# Patient Record
Sex: Male | Born: 1980 | Hispanic: Yes | Marital: Married | Smoking: Current every day smoker
Health system: Southern US, Community
[De-identification: ages and names within clinical notes are randomized; demographics above are authoritative.]

## PROBLEM LIST (undated history)

## (undated) DIAGNOSIS — I10 Essential (primary) hypertension: Secondary | ICD-10-CM

## (undated) DIAGNOSIS — J45909 Unspecified asthma, uncomplicated: Secondary | ICD-10-CM

## (undated) DIAGNOSIS — J189 Pneumonia, unspecified organism: Secondary | ICD-10-CM

## (undated) DIAGNOSIS — F410 Panic disorder [episodic paroxysmal anxiety] without agoraphobia: Secondary | ICD-10-CM

---

## 2014-09-13 ENCOUNTER — Emergency Department (HOSPITAL_COMMUNITY): Payer: Medicaid Other

## 2014-09-13 ENCOUNTER — Encounter (HOSPITAL_COMMUNITY): Payer: Self-pay

## 2014-09-13 ENCOUNTER — Emergency Department (HOSPITAL_COMMUNITY)
Admission: EM | Admit: 2014-09-13 | Discharge: 2014-09-14 | Disposition: A | Discharge status: Home / Self Care | Payer: Medicaid Other | Attending: Emergency Medicine | Admitting: Emergency Medicine

## 2014-09-13 DIAGNOSIS — Z7951 Long term (current) use of inhaled steroids: Secondary | ICD-10-CM | POA: Diagnosis not present

## 2014-09-13 DIAGNOSIS — J45909 Unspecified asthma, uncomplicated: Secondary | ICD-10-CM | POA: Diagnosis present

## 2014-09-13 DIAGNOSIS — Z79899 Other long term (current) drug therapy: Secondary | ICD-10-CM | POA: Diagnosis not present

## 2014-09-13 DIAGNOSIS — J45901 Unspecified asthma with (acute) exacerbation: Secondary | ICD-10-CM

## 2014-09-13 DIAGNOSIS — R0602 Shortness of breath: Secondary | ICD-10-CM

## 2014-09-13 HISTORY — DX: Unspecified asthma, uncomplicated: J45.909

## 2014-09-13 LAB — CBC WITH DIFFERENTIAL/PLATELET
BASOS ABS: 0.1 10*3/uL (ref 0.0–0.1)
BASOS PCT: 1 % (ref 0–1)
Eosinophils Absolute: 0.5 10*3/uL (ref 0.0–0.7)
Eosinophils Relative: 9 % — ABNORMAL HIGH (ref 0–5)
HEMATOCRIT: 44.1 % (ref 39.0–52.0)
Hemoglobin: 15.3 g/dL (ref 13.0–17.0)
Lymphocytes Relative: 40 % (ref 12–46)
Lymphs Abs: 2.3 10*3/uL (ref 0.7–4.0)
MCH: 29.1 pg (ref 26.0–34.0)
MCHC: 34.7 g/dL (ref 30.0–36.0)
MCV: 83.8 fL (ref 78.0–100.0)
Monocytes Absolute: 0.3 10*3/uL (ref 0.1–1.0)
Monocytes Relative: 5 % (ref 3–12)
NEUTROS ABS: 2.6 10*3/uL (ref 1.7–7.7)
NEUTROS PCT: 45 % (ref 43–77)
Platelets: 216 10*3/uL (ref 150–400)
RBC: 5.26 MIL/uL (ref 4.22–5.81)
RDW: 13.5 % (ref 11.5–15.5)
WBC: 5.7 10*3/uL (ref 4.0–10.5)

## 2014-09-13 LAB — BASIC METABOLIC PANEL
Anion gap: 9 (ref 5–15)
BUN: 10 mg/dL (ref 6–20)
CO2: 21 mmol/L — AB (ref 22–32)
CREATININE: 1.27 mg/dL — AB (ref 0.61–1.24)
Calcium: 8.7 mg/dL — ABNORMAL LOW (ref 8.9–10.3)
Chloride: 110 mmol/L (ref 101–111)
GFR calc non Af Amer: >60 mL/min (ref >60)
Glucose, Bld: 122 mg/dL — ABNORMAL HIGH (ref 65–99)
Potassium: 3.4 mmol/L — ABNORMAL LOW (ref 3.5–5.1)
Sodium: 140 mmol/L (ref 135–145)

## 2014-09-13 NOTE — Discharge Instructions (Signed)

## 2014-09-13 NOTE — ED Notes (Signed)
02 removed per VO Dr. Blinda Leatherwood

## 2014-09-13 NOTE — ED Provider Notes (Signed)
CSN: [id number]     Arrival date & time 09/13/14  2203 History   First MD Initiated Contact with Patient 09/13/14 2209     Chief Complaint  Patient presents with  . Asthma     (Consider location/radiation/quality/duration/timing/severity/associated sxs/prior Treatment) HPI Comments: Patient with previous history of asthma presents to the emergency department by ambulance for shortness of breath. Patient reportedly started having difficulty breathing with cough, congestion 2 days ago. He has had a cough that is productive of green sputum. He started having increased wheezing this morning. He does not have an inhaler. He recently moved to the area, does not have a primary physician. EMS was called and he felt better after nebulizer treatment, did not transport to the hospital. He went to work today, then 1 walking home, began to have shortness of breath again. Patient is brought to the ER after receiving Solu-Medrol, continuous albuterol and Atrovent. He is improving.  Patient is a 34 y.o. male presenting with asthma.  Asthma Associated symptoms include shortness of breath.    Past Medical History  Diagnosis Date  . Asthma    History reviewed. No pertinent past surgical history. History reviewed. No pertinent family history. History  Substance Use Topics  . Smoking status: Current Some Day Smoker  . Smokeless tobacco: Not on file  . Alcohol Use: Yes     Comment: occ    Review of Systems  Respiratory: Positive for cough, shortness of breath and wheezing.   All other systems reviewed and are negative.     Allergies  Review of patient's allergies indicates no known allergies.  Home Medications   Prior to Admission medications   Medication Sig Start Date End Date Taking? Authorizing Provider  albuterol (PROVENTIL HFA;VENTOLIN HFA) 108 (90 BASE) MCG/ACT inhaler Inhale 1 puff into the lungs 2 (two) times daily as needed for wheezing or shortness of breath.   Yes Historical  Provider, MD  Fluticasone-Salmeterol (ADVAIR DISKUS IN) Inhale 1 puff into the lungs 2 (two) times daily.   Yes Historical Provider, MD   BP 106/55 mmHg  Pulse 83  Temp(Src) 98 F (36.7 C) (Oral)  Resp 18  Ht 6' (1.829 m)  Wt 187 lb (84.823 kg)  BMI 25.36 kg/m2  SpO2 91% Physical Exam  Constitutional: He is oriented to person, place, and time. He appears well-developed and well-nourished. No distress.  HENT:  Head: Normocephalic and atraumatic.  Right Ear: Hearing normal.  Left Ear: Hearing normal.  Nose: Nose normal.  Mouth/Throat: Oropharynx is clear and moist and mucous membranes are normal.  Eyes: Conjunctivae and EOM are normal. Pupils are equal, round, and reactive to light.  Neck: Normal range of motion. Neck supple.  Cardiovascular: Regular rhythm, S1 normal and S2 normal.  Exam reveals no gallop and no friction rub.   No murmur heard. Pulmonary/Chest: Effort normal and breath sounds normal. No respiratory distress. He exhibits no tenderness.  Abdominal: Soft. Normal appearance and bowel sounds are normal. There is no hepatosplenomegaly. There is no tenderness. There is no rebound, no guarding, no tenderness at McBurney's point and negative Murphy's sign. No hernia.  Musculoskeletal: Normal range of motion.  Neurological: He is alert and oriented to person, place, and time. He has normal strength. No cranial nerve deficit or sensory deficit. Coordination normal. GCS eye subscore is 4. GCS verbal subscore is 5. GCS motor subscore is 6.  Skin: Skin is warm, dry and intact. No rash noted. No cyanosis.  Psychiatric: He has a normal  mood and affect. His speech is normal and behavior is normal. Thought content normal.  Nursing note and vitals reviewed.   ED Course  Procedures (including critical care time) Labs Review Labs Reviewed  CBC WITH DIFFERENTIAL/PLATELET - Abnormal; Notable for the following:    Eosinophils Relative 9 (*)    All other components within normal limits   BASIC METABOLIC PANEL - Abnormal; Notable for the following:    Potassium 3.4 (*)    CO2 21 (*)    Glucose, Bld 122 (*)    Creatinine, Ser 1.27 (*)    Calcium 8.7 (*)    All other components within normal limits    Imaging Review Dg Chest 2 View  09/13/2014   CLINICAL DATA:  Chest pain and shortness of breath. Symptoms for 1 day.  EXAM: CHEST  2 VIEW  COMPARISON:  None.  FINDINGS: Borderline hyperinflation. The cardiomediastinal contours are normal. Pulmonary vasculature is normal. No consolidation, pleural effusion, or pneumothorax. No acute osseous abnormalities are seen.  IMPRESSION: Borderline hyperinflation which can be seen with bronchitis, asthma, or may be chronic. No localizing process.   Electronically Signed   By: Rubye Oaks M.D.   On: 09/13/2014 23:37     EKG Interpretation   Date/Time:  Monday Sep 13 2014 22:13:40 EDT Ventricular Rate:  86 PR Interval:  140 QRS Duration: 101 QT Interval:  354 QTC Calculation: 423 R Axis:   79 Text Interpretation:  Sinus rhythm ST elev, probable normal early repol  pattern Confirmed by Dlynn Ranes  MD, Kely Dohn [phone]) on 09/13/2014  10:19:31 PM      MDM   Final diagnoses:  Shortness of breath   asthma exacerbation  Presents to the ER for evaluation of 2 days of cough, worsening shortness of breath and wheezing. She has a history of asthma, does not have access to the dilators at home. He has improved after treatment here in the ER. Patient will be discharged with inhaler. He will be given a course of prednisone. Follow-up as needed, was referred to primary care.    Gilda Crease, MD 09/13/14 [phone]

## 2014-09-13 NOTE — ED Notes (Signed)
Per EMS, pt has hx of asthma, had an asthma attack this morning ems came out and gave him a treatment and the pt denied transport at that time. About an hour ago pt began having another asthma attack and ems called out for transport. Pt received 10mg  total albuterol, 0.5atrovent, and 125mg  solumedrol in route. Pt still wheezing but feeling better and breathing better since treatment. Pt states that he had productive cough x 2 days with yellow phlem. Pty recently moved here and has been out of his albuterol and advair since. Pt in no acute distress at this time. Dr Blinda Leatherwood in room.

## 2014-09-14 MED ORDER — FLUTICASONE-SALMETEROL 250-50 MCG/DOSE IN AEPB: 1.0000 | INHALATION_SPRAY | Freq: Two times a day (BID) | RESPIRATORY_TRACT | Status: DC

## 2014-09-14 MED ORDER — PREDNISONE 20 MG PO TABS: 60.0000 mg | ORAL_TABLET | Freq: Every day | ORAL | Status: DC

## 2014-09-14 MED ORDER — ALBUTEROL SULFATE HFA 108 (90 BASE) MCG/ACT IN AERS: 2.0000 | INHALATION_SPRAY | RESPIRATORY_TRACT | Status: DC | PRN

## 2014-09-14 MED ORDER — ALBUTEROL SULFATE HFA 108 (90 BASE) MCG/ACT IN AERS
2.0000 | INHALATION_SPRAY | RESPIRATORY_TRACT | Status: DC | PRN
  Administered 2014-09-14: 2 via RESPIRATORY_TRACT
  Filled 2014-09-14: qty 6.7

## 2014-09-29 ENCOUNTER — Emergency Department (HOSPITAL_COMMUNITY)
Admission: EM | Admit: 2014-09-29 | Discharge: 2014-09-29 | Disposition: A | Discharge status: Home / Self Care | Payer: Medicaid Other | Attending: Emergency Medicine | Admitting: Emergency Medicine

## 2014-09-29 ENCOUNTER — Emergency Department (HOSPITAL_COMMUNITY): Payer: Medicaid Other

## 2014-09-29 ENCOUNTER — Encounter (HOSPITAL_COMMUNITY): Payer: Self-pay

## 2014-09-29 DIAGNOSIS — S63619A Unspecified sprain of unspecified finger, initial encounter: Secondary | ICD-10-CM

## 2014-09-29 DIAGNOSIS — Z79899 Other long term (current) drug therapy: Secondary | ICD-10-CM | POA: Diagnosis not present

## 2014-09-29 DIAGNOSIS — Y990 Civilian activity done for income or pay: Secondary | ICD-10-CM | POA: Diagnosis not present

## 2014-09-29 DIAGNOSIS — Z7952 Long term (current) use of systemic steroids: Secondary | ICD-10-CM | POA: Insufficient documentation

## 2014-09-29 DIAGNOSIS — W1839XA Other fall on same level, initial encounter: Secondary | ICD-10-CM | POA: Diagnosis not present

## 2014-09-29 DIAGNOSIS — J45909 Unspecified asthma, uncomplicated: Secondary | ICD-10-CM | POA: Insufficient documentation

## 2014-09-29 DIAGNOSIS — Y939 Activity, unspecified: Secondary | ICD-10-CM | POA: Insufficient documentation

## 2014-09-29 DIAGNOSIS — Z720 Tobacco use: Secondary | ICD-10-CM | POA: Insufficient documentation

## 2014-09-29 DIAGNOSIS — S8001XA Contusion of right knee, initial encounter: Secondary | ICD-10-CM | POA: Diagnosis not present

## 2014-09-29 DIAGNOSIS — Y929 Unspecified place or not applicable: Secondary | ICD-10-CM | POA: Diagnosis not present

## 2014-09-29 DIAGNOSIS — S63617A Unspecified sprain of left little finger, initial encounter: Secondary | ICD-10-CM | POA: Diagnosis not present

## 2014-09-29 DIAGNOSIS — S8991XA Unspecified injury of right lower leg, initial encounter: Secondary | ICD-10-CM | POA: Diagnosis present

## 2014-09-29 MED ORDER — IBUPROFEN 800 MG PO TABS: 800.0000 mg | ORAL_TABLET | Freq: Three times a day (TID) | ORAL | Status: DC

## 2014-09-29 NOTE — ED Notes (Signed)
Patient arrived via EMS with complaints of right knee pain following fall at work this morning.

## 2014-09-29 NOTE — ED Provider Notes (Signed)
CSN: [id number]     Arrival date & time 09/29/14  2235 History  This chart was scribed for Elpidio Anis, PA-C, working with Doug Sou, MD by Chestine Spore, ED Scribe. The patient was seen in room WTR8/WTR8 at 11:35 PM.    Chief Complaint  Patient presents with  . Knee Injury      The history is provided by the patient. No language interpreter was used.    HPI Comments: Joseph Huff is a 34 y.o. male who presents to the Emergency Department complaining of right knee injury onset this morning. He reports that he fell at work onto concrete and his right knee broke his fall. He notes that his fingers bent backwards when he fell. He reports that he has been walking on the knee since the incident. Denies any prior injury to this knee. He states that he is having associated symptoms of left hand pain, joint swelling of left hand and right knee, bruising to right knee. He denies any other symptoms.   Past Medical History  Diagnosis Date  . Asthma    History reviewed. No pertinent past surgical history. History reviewed. No pertinent family history. History  Substance Use Topics  . Smoking status: Current Some Day Smoker  . Smokeless tobacco: Not on file  . Alcohol Use: Yes     Comment: occ    Review of Systems  Musculoskeletal: Positive for joint swelling and arthralgias (right knee and left hand).  Skin: Positive for color change and wound.  Neurological: Negative for weakness and numbness.      Allergies  Review of patient's allergies indicates no known allergies.  Home Medications   Prior to Admission medications   Medication Sig Start Date End Date Taking? Authorizing Provider  albuterol (PROVENTIL HFA;VENTOLIN HFA) 108 (90 BASE) MCG/ACT inhaler Inhale 2 puffs into the lungs every 4 (four) hours as needed for wheezing or shortness of breath. 09/13/14   Gilda Crease, MD  Fluticasone-Salmeterol (ADVAIR DISKUS) 250-50 MCG/DOSE AEPB Inhale 1 puff into the lungs 2  (two) times daily. 09/14/14   Gilda Crease, MD  predniSONE (DELTASONE) 20 MG tablet Take 3 tablets (60 mg total) by mouth daily with breakfast. 09/13/14   Gilda Crease, MD   BP 119/84 mmHg  Pulse 76  Temp(Src) 98.6 F (37 C) (Oral)  Resp 16  SpO2 99% Physical Exam  Constitutional: He is oriented to person, place, and time. He appears well-developed and well-nourished. No distress.  HENT:  Head: Normocephalic and atraumatic.  Eyes: EOM are normal.  Neck: Neck supple. No tracheal deviation present.  Cardiovascular: Normal rate.   Pulmonary/Chest: Effort normal. No respiratory distress.  Musculoskeletal: Normal range of motion.       Right knee: He exhibits swelling. He exhibits normal range of motion and no deformity.       Left hand: He exhibits swelling. He exhibits no deformity.  Right knee moderately swollen, mild bruising on medial aspect. Joint is stable. No bony deformities. FROM. No calf or thigh tenderness. Left hand mildly swollen fifth digit without discoloration or bony deformity. No tendon deficient at any joint level   Neurological: He is alert and oriented to person, place, and time.  Skin: Skin is warm and dry.  Psychiatric: He has a normal mood and affect. His behavior is normal.  Nursing note and vitals reviewed.   ED Course  Procedures (including critical care time) DIAGNOSTIC STUDIES: Oxygen Saturation is 99% on RA, nl by my interpretation.  COORDINATION OF CARE: 11:37 PM-Discussed treatment plan which includes right knee x-ray and left hand x-ray with pt at bedside and pt agreed to plan.   Labs Review Labs Reviewed - No data to display  Imaging Review Dg Knee Complete 4 Views Right  09/29/2014   CLINICAL DATA:  Right knee pain after a fall at work this morning  EXAM: RIGHT KNEE - COMPLETE 4+ VIEW  COMPARISON:  None.  FINDINGS: There is no evidence of fracture, dislocation, or joint effusion. There is no evidence of arthropathy or other  focal bone abnormality. Soft tissues are unremarkable.  IMPRESSION: Negative.   Electronically Signed   By: Burman Nieves M.D.   On: 09/29/2014 23:34     EKG Interpretation None      MDM   Final diagnoses:  None    1. Right knee contusion 2. Left finger sprain  Patient is ambulatory without difficulty. Finger has FROM without tendon deficit. Injuries requiring supportive management only.   I personally performed the services described in this documentation, which was scribed in my presence. The recorded information has been reviewed and is accurate.     Elpidio Anis, PA-C 09/30/14 1610  Doug Sou, MD 09/30/14 [phone]

## 2014-09-29 NOTE — Discharge Instructions (Signed)
Contusion A contusion is a deep bruise. Contusions are the result of an injury that caused bleeding under the skin. The contusion may turn blue, purple, or yellow. Minor injuries will give you a painless contusion, but more severe contusions may stay painful and swollen for a few weeks.  CAUSES  A contusion is usually caused by a blow, trauma, or direct force to an area of the body. SYMPTOMS   Swelling and redness of the injured area.  Bruising of the injured area.  Tenderness and soreness of the injured area.  Pain. DIAGNOSIS  The diagnosis can be made by taking a history and physical exam. An X-ray, CT scan, or MRI may be needed to determine if there were any associated injuries, such as fractures. TREATMENT  Specific treatment will depend on what area of the body was injured. In general, the best treatment for a contusion is resting, icing, elevating, and applying cold compresses to the injured area. Over-the-counter medicines may also be recommended for pain control. Ask your caregiver what the best treatment is for your contusion. HOME CARE INSTRUCTIONS   Put ice on the injured area.  Put ice in a plastic bag.  Place a towel between your skin and the bag.  Leave the ice on for 15-20 minutes, 3-4 times a day, or as directed by your health care provider.  Only take over-the-counter or prescription medicines for pain, discomfort, or fever as directed by your caregiver. Your caregiver may recommend avoiding anti-inflammatory medicines (aspirin, ibuprofen, and naproxen) for 48 hours because these medicines may increase bruising.  Rest the injured area.  If possible, elevate the injured area to reduce swelling. SEEK IMMEDIATE MEDICAL CARE IF:   You have increased bruising or swelling.  You have pain that is getting worse.  Your swelling or pain is not relieved with medicines. MAKE SURE YOU:   Understand these instructions.  Will watch your condition.  Will get help right  away if you are not doing well or get worse. Document Released: 01/10/2005 Document Revised: 04/07/2013 Document Reviewed: 02/05/2011 [facility] Patient Information 2015 [place], [place]. This information is not intended to replace advice given to you by your health care provider. Make sure you discuss any questions you have with your health care provider.  Cryotherapy Cryotherapy means treatment with cold. Ice or gel packs can be used to reduce both pain and swelling. Ice is the most helpful within the first 24 to 48 hours after an injury or flare-up from overusing a muscle or joint. Sprains, strains, spasms, burning pain, shooting pain, and aches can all be eased with ice. Ice can also be used when recovering from surgery. Ice is effective, has very few side effects, and is safe for most people to use. PRECAUTIONS  Ice is not a safe treatment option for people with:  Raynaud phenomenon. This is a condition affecting small blood vessels in the extremities. Exposure to cold may cause your problems to return.  Cold hypersensitivity. There are many forms of cold hypersensitivity, including:  Cold urticaria. Red, itchy hives appear on the skin when the tissues begin to warm after being iced.  Cold erythema. This is a red, itchy rash caused by exposure to cold.  Cold hemoglobinuria. Red blood cells break down when the tissues begin to warm after being iced. The hemoglobin that carry oxygen are passed into the urine because they cannot combine with blood proteins fast enough.  Numbness or altered sensitivity in the area being iced. If you have any  of the following conditions, do not use ice until you have discussed cryotherapy with your caregiver:  Heart conditions, such as arrhythmia, angina, or chronic heart disease.  High blood pressure.  Healing wounds or open skin in the area being iced.  Current infections.  Rheumatoid arthritis.  Poor circulation.  Diabetes. Ice slows the blood flow  in the region it is applied. This is beneficial when trying to stop inflamed tissues from spreading irritating chemicals to surrounding tissues. However, if you expose your skin to cold temperatures for too long or without the proper protection, you can damage your skin or nerves. Watch for signs of skin damage due to cold. HOME CARE INSTRUCTIONS Follow these tips to use ice and cold packs safely.  Place a dry or damp towel between the ice and skin. A damp towel will cool the skin more quickly, so you may need to shorten the time that the ice is used.  For a more rapid response, add gentle compression to the ice.  Ice for no more than 10 to 20 minutes at a time. The bonier the area you are icing, the less time it will take to get the benefits of ice.  Check your skin after 5 minutes to make sure there are no signs of a poor response to cold or skin damage.  Rest 20 minutes or more between uses.  Once your skin is numb, you can end your treatment. You can test numbness by very lightly touching your skin. The touch should be so light that you do not see the skin dimple from the pressure of your fingertip. When using ice, most people will feel these normal sensations in this order: cold, burning, aching, and numbness.  Do not use ice on someone who cannot communicate their responses to pain, such as small children or people with dementia. HOW TO MAKE AN ICE PACK Ice packs are the most common way to use ice therapy. Other methods include ice massage, ice baths, and cryosprays. Muscle creams that cause a cold, tingly feeling do not offer the same benefits that ice offers and should not be used as a substitute unless recommended by your caregiver. To make an ice pack, do one of the following:  Place crushed ice or a bag of frozen vegetables in a sealable plastic bag. Squeeze out the excess air. Place this bag inside another plastic bag. Slide the bag into a pillowcase or place a damp towel between  your skin and the bag.  Mix 3 parts water with 1 part rubbing alcohol. Freeze the mixture in a sealable plastic bag. When you remove the mixture from the freezer, it will be slushy. Squeeze out the excess air. Place this bag inside another plastic bag. Slide the bag into a pillowcase or place a damp towel between your skin and the bag. SEEK MEDICAL CARE IF:  You develop white spots on your skin. This may give the skin a blotchy (mottled) appearance.  Your skin turns blue or pale.  Your skin becomes waxy or hard.  Your swelling gets worse. MAKE SURE YOU:   Understand these instructions.  Will watch your condition.  Will get help right away if you are not doing well or get worse. Document Released: 11/27/2010 Document Revised: 08/17/2013 Document Reviewed: 11/27/2010 [facility] Patient Information 2015 [place], [place]. This information is not intended to replace advice given to you by your health care provider. Make sure you discuss any questions you have with your health care provider. Finger  Sprain A finger sprain is a tear in one of the strong, fibrous tissues that connect the bones (ligaments) in your finger. The severity of the sprain depends on how much of the ligament is torn. The tear can be either partial or complete. CAUSES  Often, sprains are a result of a fall or accident. If you extend your hands to catch an object or to protect yourself, the force of the impact causes the fibers of your ligament to stretch too much. This excess tension causes the fibers of your ligament to tear. SYMPTOMS  You may have some loss of motion in your finger. Other symptoms include:  Bruising.  Tenderness.  Swelling. DIAGNOSIS  In order to diagnose finger sprain, your caregiver will physically examine your finger or thumb to determine how torn the ligament is. Your caregiver may also suggest an X-ray exam of your finger to make sure no bones are broken. TREATMENT  If your ligament is only  partially torn, treatment usually involves keeping the finger in a fixed position (immobilization) for a short period. To do this, your caregiver will apply a bandage, cast, or splint to keep your finger from moving until it heals. For a partially torn ligament, the healing process usually takes 2 to 3 weeks. If your ligament is completely torn, you may need surgery to reconnect the ligament to the bone. After surgery a cast or splint will be applied and will need to stay on your finger or thumb for 4 to 6 weeks while your ligament heals. HOME CARE INSTRUCTIONS  Keep your injured finger elevated, when possible, to decrease swelling.  To ease pain and swelling, apply ice to your joint twice a day, for 2 to 3 days:  Put ice in a plastic bag.  Place a towel between your skin and the bag.  Leave the ice on for 15 minutes.  Only take over-the-counter or prescription medicine for pain as directed by your caregiver.  Do not wear rings on your injured finger.  Do not leave your finger unprotected until pain and stiffness go away (usually 3 to 4 weeks).  Do not allow your cast or splint to get wet. Cover your cast or splint with a plastic bag when you shower or bathe. Do not swim.  Your caregiver may suggest special exercises for you to do during your recovery to prevent or limit permanent stiffness. SEEK IMMEDIATE MEDICAL CARE IF:  Your cast or splint becomes damaged.  Your pain becomes worse rather than better. MAKE SURE YOU:  Understand these instructions.  Will watch your condition.  Will get help right away if you are not doing well or get worse. Document Released: 05/10/2004 Document Revised: 06/25/2011 Document Reviewed: 12/04/2010 [facility] Patient Information 2015 [place], [place]. This information is not intended to replace advice given to you by your health care provider. Make sure you discuss any questions you have with your health care provider.

## 2014-12-08 ENCOUNTER — Emergency Department (HOSPITAL_COMMUNITY)
Admission: EM | Admit: 2014-12-08 | Discharge: 2014-12-08 | Disposition: A | Discharge status: Home / Self Care | Payer: Medicaid Other | Attending: Emergency Medicine | Admitting: Emergency Medicine

## 2014-12-08 ENCOUNTER — Encounter (HOSPITAL_COMMUNITY): Payer: Self-pay | Admitting: *Deleted

## 2014-12-08 DIAGNOSIS — Z720 Tobacco use: Secondary | ICD-10-CM | POA: Insufficient documentation

## 2014-12-08 DIAGNOSIS — Z7952 Long term (current) use of systemic steroids: Secondary | ICD-10-CM | POA: Insufficient documentation

## 2014-12-08 DIAGNOSIS — R0602 Shortness of breath: Secondary | ICD-10-CM | POA: Diagnosis present

## 2014-12-08 DIAGNOSIS — Z791 Long term (current) use of non-steroidal anti-inflammatories (NSAID): Secondary | ICD-10-CM | POA: Insufficient documentation

## 2014-12-08 DIAGNOSIS — J45901 Unspecified asthma with (acute) exacerbation: Secondary | ICD-10-CM | POA: Diagnosis not present

## 2014-12-08 DIAGNOSIS — Z7951 Long term (current) use of inhaled steroids: Secondary | ICD-10-CM | POA: Insufficient documentation

## 2014-12-08 DIAGNOSIS — J45909 Unspecified asthma, uncomplicated: Secondary | ICD-10-CM

## 2014-12-08 MED ORDER — ALBUTEROL (5 MG/ML) CONTINUOUS INHALATION SOLN
10.0000 mg/h | INHALATION_SOLUTION | RESPIRATORY_TRACT | Status: AC
  Administered 2014-12-08: 10 mg/h via RESPIRATORY_TRACT
  Filled 2014-12-08: qty 20

## 2014-12-08 MED ORDER — PREDNISONE 50 MG PO TABS: 50.0000 mg | ORAL_TABLET | Freq: Every day | ORAL | Status: DC

## 2014-12-08 MED ORDER — AEROCHAMBER PLUS FLO-VU MEDIUM MISC
1.0000 | Freq: Once | Status: AC
  Administered 2014-12-08: 1
  Filled 2014-12-08: qty 1

## 2014-12-08 MED ORDER — ALBUTEROL SULFATE HFA 108 (90 BASE) MCG/ACT IN AERS
2.0000 | INHALATION_SPRAY | RESPIRATORY_TRACT | Status: DC | PRN
Start: 2014-12-08 — End: 2014-12-08
  Administered 2014-12-08: 2 via RESPIRATORY_TRACT
  Filled 2014-12-08: qty 6.7

## 2014-12-08 NOTE — Discharge Instructions (Signed)
Return here as needed. Follow up with a primary doctor by calling the number on the back of your card and requesting a new physician

## 2014-12-08 NOTE — ED Notes (Signed)
RT at the bedside.

## 2014-12-08 NOTE — ED Notes (Signed)
Pt to ED from home via [facility] c/o shortness of breath x2 days. EMS gave 125mg  solumedrol, duonebs x2 with 0.5mg  atrovent. Pt reports needing to find a PCP to control asthma. Pt also c/o lower back pain from dry cough

## 2014-12-08 NOTE — ED Provider Notes (Signed)
CSN: [id number]     Arrival date & time 12/08/14  0703 History   First MD Initiated Contact with Patient 12/08/14 0715     Chief Complaint  Patient presents with  . Shortness of Breath     (Consider location/radiation/quality/duration/timing/severity/associated sxs/prior Treatment) HPI Patient is a 34 yo male with a PMH of asthma who presents with increasing SOB x 2 days. He ran out of Albuterol and Advair two days ago and has been getting progressively worse. He developed an asthma attack early this morning with increased SOB, wheezing, palpitations, cough, CP, sweating, feeling feverish, and chills. He complains of worsening symptoms when lying flat on back and mid chest wall tenderness along the sternum.  He has had no sputum production, N/V/D, and normal BM's. He has been fatigued these past two days, especially preceding an attack.    Patient was transported to ED via EMS and received 125 mg Solumedrol, duonebs x2 with 0.5 mg atrovent. He is feeling much better after administration of medication. He still complains of chest wall tenderness, mild fatigue, and pain, cough, and wheezing produced with deep inspiration.   Past Medical History  Diagnosis Date  . Asthma    History reviewed. No pertinent past surgical history. History reviewed. No pertinent family history. Social History  Substance Use Topics  . Smoking status: Current Some Day Smoker  . Smokeless tobacco: None  . Alcohol Use: Yes     Comment: occ    Review of Systems All other systems negative except as documented in the HPI. All pertinent positives and negatives as reviewed in the HPI.  Allergies  Review of patient's allergies indicates no known allergies.  Home Medications   Prior to Admission medications   Medication Sig Start Date End Date Taking? Authorizing Provider  albuterol (PROVENTIL HFA;VENTOLIN HFA) 108 (90 BASE) MCG/ACT inhaler Inhale 2 puffs into the lungs every 4 (four) hours as needed for wheezing  or shortness of breath. 09/13/14   Gilda Crease, MD  Fluticasone-Salmeterol (ADVAIR DISKUS) 250-50 MCG/DOSE AEPB Inhale 1 puff into the lungs 2 (two) times daily. 09/14/14   Gilda Crease, MD  ibuprofen (ADVIL,MOTRIN) 800 MG tablet Take 1 tablet (800 mg total) by mouth 3 (three) times daily. 09/29/14   Elpidio Anis, PA-C  predniSONE (DELTASONE) 20 MG tablet Take 3 tablets (60 mg total) by mouth daily with breakfast. 09/13/14   Gilda Crease, MD   BP 135/78 mmHg  Pulse 104  Temp(Src) 97.8 F (36.6 C) (Oral)  Resp 18  Ht 6' (1.829 m)  Wt 185 lb (83.915 kg)  BMI 25.08 kg/m2  SpO2 96% Physical Exam  Constitutional: He is oriented to person, place, and time. He appears well-developed and well-nourished.  HENT:  Head: Normocephalic and atraumatic.  Mouth/Throat: Oropharynx is clear and moist.  Eyes: Pupils are equal, round, and reactive to light.  Neck: Normal range of motion. Neck supple.  Cardiovascular: Normal rate, regular rhythm and normal heart sounds.  Exam reveals no gallop and no friction rub.   No murmur heard. Pulmonary/Chest: Effort normal. He has wheezes.  Pain and cough present with deep inspiration.  Chest wall tenderness along the sternal border.   Abdominal: Soft. Bowel sounds are normal. He exhibits no distension. There is no tenderness.  Neurological: He is alert and oriented to person, place, and time. He exhibits normal muscle tone. Coordination normal.  Skin: Skin is warm and dry. No erythema.  Psychiatric: He has a normal mood and affect. His behavior  is normal.  Nursing note and vitals reviewed.   ED Course  Procedures (including critical care time) Labs Review Labs Reviewed - No data to display  Imaging Review No results found. I have personally reviewed and evaluated these images and lab results as part of my medical decision-making.   EKG Interpretation   Date/Time:  Wednesday December 08 2014 07:15:56 EDT Ventricular Rate:   109 PR Interval:  148 QRS Duration: 92 QT Interval:  337 QTC Calculation: 454 R Axis:   80 Text Interpretation:  Sinus tachycardia Consider right atrial enlargement  No significant change since last tracing Confirmed by FLOYD MD, DANIEL  ([id number]) on 12/08/2014 7:23:34 AM       Patient has improved after breathing treatment and sterile limits.  Told to return here as needed.  The patient agrees the plan and all questions were answered  Charlestine Night, PA-C 12/13/14 0143  Melene Plan, DO 12/15/14 1344

## 2014-12-08 NOTE — ED Notes (Signed)
Walked patient with the pulse oxy patient got up too 100 per cent on room  Air dropped low as 91 patient heart rate stayed at

## 2014-12-13 ENCOUNTER — Other Ambulatory Visit: Payer: Self-pay

## 2014-12-13 ENCOUNTER — Emergency Department (HOSPITAL_COMMUNITY): Payer: Medicaid Other

## 2014-12-13 ENCOUNTER — Emergency Department (HOSPITAL_COMMUNITY)
Admission: EM | Admit: 2014-12-13 | Discharge: 2014-12-13 | Disposition: A | Discharge status: Home / Self Care | Payer: Medicaid Other | Attending: Emergency Medicine | Admitting: Emergency Medicine

## 2014-12-13 ENCOUNTER — Encounter (HOSPITAL_COMMUNITY): Payer: Self-pay | Admitting: Emergency Medicine

## 2014-12-13 DIAGNOSIS — Z79899 Other long term (current) drug therapy: Secondary | ICD-10-CM | POA: Diagnosis not present

## 2014-12-13 DIAGNOSIS — J45909 Unspecified asthma, uncomplicated: Secondary | ICD-10-CM | POA: Insufficient documentation

## 2014-12-13 DIAGNOSIS — R42 Dizziness and giddiness: Secondary | ICD-10-CM | POA: Insufficient documentation

## 2014-12-13 DIAGNOSIS — Z7952 Long term (current) use of systemic steroids: Secondary | ICD-10-CM | POA: Diagnosis not present

## 2014-12-13 DIAGNOSIS — Z7951 Long term (current) use of inhaled steroids: Secondary | ICD-10-CM | POA: Insufficient documentation

## 2014-12-13 DIAGNOSIS — Z720 Tobacco use: Secondary | ICD-10-CM | POA: Diagnosis not present

## 2014-12-13 DIAGNOSIS — R110 Nausea: Secondary | ICD-10-CM | POA: Diagnosis present

## 2014-12-13 DIAGNOSIS — J069 Acute upper respiratory infection, unspecified: Secondary | ICD-10-CM | POA: Diagnosis not present

## 2014-12-13 LAB — URINALYSIS, ROUTINE W REFLEX MICROSCOPIC
Bilirubin Urine: NEGATIVE
Glucose, UA: NEGATIVE mg/dL
Hgb urine dipstick: NEGATIVE
Ketones, ur: 15 mg/dL — AB
LEUKOCYTES UA: NEGATIVE
Nitrite: NEGATIVE
PROTEIN: NEGATIVE mg/dL
Specific Gravity, Urine: 1.033 — ABNORMAL HIGH (ref 1.005–1.030)
Urobilinogen, UA: 0.2 mg/dL (ref 0.0–1.0)
pH: 5.5 (ref 5.0–8.0)

## 2014-12-13 LAB — CBC
HCT: 47.7 % (ref 39.0–52.0)
Hemoglobin: 16 g/dL (ref 13.0–17.0)
MCH: 28.9 pg (ref 26.0–34.0)
MCHC: 33.5 g/dL (ref 30.0–36.0)
MCV: 86.3 fL (ref 78.0–100.0)
PLATELETS: 232 10*3/uL (ref 150–400)
RBC: 5.53 MIL/uL (ref 4.22–5.81)
RDW: 13.2 % (ref 11.5–15.5)
WBC: 7.3 10*3/uL (ref 4.0–10.5)

## 2014-12-13 LAB — RAPID STREP SCREEN (MED CTR MEBANE ONLY): Streptococcus, Group A Screen (Direct): NEGATIVE

## 2014-12-13 LAB — BASIC METABOLIC PANEL
Anion gap: 7 (ref 5–15)
BUN: 17 mg/dL (ref 6–20)
CO2: 28 mmol/L (ref 22–32)
CREATININE: 1.34 mg/dL — AB (ref 0.61–1.24)
Calcium: 9.6 mg/dL (ref 8.9–10.3)
Chloride: 104 mmol/L (ref 101–111)
GFR calc non Af Amer: >60 mL/min (ref >60)
Glucose, Bld: 88 mg/dL (ref 65–99)
Potassium: 4.2 mmol/L (ref 3.5–5.1)
SODIUM: 139 mmol/L (ref 135–145)

## 2014-12-13 MED ORDER — LIDOCAINE VISCOUS 2 % MT SOLN
15.0000 mL | Freq: Once | OROMUCOSAL | Status: AC
  Administered 2014-12-13: 15 mL via OROMUCOSAL
  Filled 2014-12-13: qty 15

## 2014-12-13 MED ORDER — HYDROCODONE-HOMATROPINE 5-1.5 MG/5ML PO SYRP
5.0000 mL | ORAL_SOLUTION | Freq: Once | ORAL | Status: AC
  Administered 2014-12-13: 5 mL via ORAL
  Filled 2014-12-13: qty 5

## 2014-12-13 MED ORDER — HYDROCODONE-HOMATROPINE 5-1.5 MG/5ML PO SYRP: 5.0000 mL | ORAL_SOLUTION | Freq: Four times a day (QID) | ORAL | Status: DC | PRN

## 2014-12-13 NOTE — Discharge Instructions (Signed)
Upper Respiratory Infection, Adult Follow up with [facility]. An upper respiratory infection (URI) is also sometimes known as the common cold. The upper respiratory tract includes the nose, sinuses, throat, trachea, and bronchi. Bronchi are the airways leading to the lungs. Most people improve within 1 week, but symptoms can last up to 2 weeks. A residual cough may last even longer.  CAUSES Many different viruses can infect the tissues lining the upper respiratory tract. The tissues become irritated and inflamed and often become very moist. Mucus production is also common. A cold is contagious. You can easily spread the virus to others by oral contact. This includes kissing, sharing a glass, coughing, or sneezing. Touching your mouth or nose and then touching a surface, which is then touched by another person, can also spread the virus. SYMPTOMS  Symptoms typically develop 1 to 3 days after you come in contact with a cold virus. Symptoms vary from person to person. They may include:  Runny nose.  Sneezing.  Nasal congestion.  Sinus irritation.  Sore throat.  Loss of voice (laryngitis).  Cough.  Fatigue.  Muscle aches.  Loss of appetite.  Headache.  Low-grade fever. DIAGNOSIS  You might diagnose your own cold based on familiar symptoms, since most people get a cold 2 to 3 times a year. Your caregiver can confirm this based on your exam. Most importantly, your caregiver can check that your symptoms are not due to another disease such as strep throat, sinusitis, pneumonia, asthma, or epiglottitis. Blood tests, throat tests, and X-rays are not necessary to diagnose a common cold, but they may sometimes be helpful in excluding other more serious diseases. Your caregiver will decide if any further tests are required. RISKS AND COMPLICATIONS  You may be at risk for a more severe case of the common cold if you smoke cigarettes, have chronic heart disease (such as heart  failure) or lung disease (such as asthma), or if you have a weakened immune system. The very young and very old are also at risk for more serious infections. Bacterial sinusitis, middle ear infections, and bacterial pneumonia can complicate the common cold. The common cold can worsen asthma and chronic obstructive pulmonary disease (COPD). Sometimes, these complications can require emergency medical care and may be life-threatening. PREVENTION  The best way to protect against getting a cold is to practice good hygiene. Avoid oral or hand contact with people with cold symptoms. Wash your hands often if contact occurs. There is no clear evidence that vitamin C, vitamin E, echinacea, or exercise reduces the chance of developing a cold. However, it is always recommended to get plenty of rest and practice good nutrition. TREATMENT  Treatment is directed at relieving symptoms. There is no cure. Antibiotics are not effective, because the infection is caused by a virus, not by bacteria. Treatment may include:  Increased fluid intake. Sports drinks offer valuable electrolytes, sugars, and fluids.  Breathing heated mist or steam (vaporizer or shower).  Eating chicken soup or other clear broths, and maintaining good nutrition.  Getting plenty of rest.  Using gargles or lozenges for comfort.  Controlling fevers with ibuprofen or acetaminophen as directed by your caregiver.  Increasing usage of your inhaler if you have asthma. Zinc gel and zinc lozenges, taken in the first 24 hours of the common cold, can shorten the duration and lessen the severity of symptoms. Pain medicines may help with fever, muscle aches, and throat pain. A variety of non-prescription medicines are available to  treat congestion and runny nose. Your caregiver can make recommendations and may suggest nasal or lung inhalers for other symptoms.  HOME CARE INSTRUCTIONS   Only take over-the-counter or prescription medicines for pain,  discomfort, or fever as directed by your caregiver.  Use a warm mist humidifier or inhale steam from a shower to increase air moisture. This may keep secretions moist and make it easier to breathe.  Drink enough water and fluids to keep your urine clear or pale yellow.  Rest as needed.  Return to work when your temperature has returned to normal or as your caregiver advises. You may need to stay home longer to avoid infecting others. You can also use a face mask and careful hand washing to prevent spread of the virus. SEEK MEDICAL CARE IF:   After the first few days, you feel you are getting worse rather than better.  You need your caregiver's advice about medicines to control symptoms.  You develop chills, worsening shortness of breath, or brown or red sputum. These may be signs of pneumonia.  You develop yellow or brown nasal discharge or pain in the face, especially when you bend forward. These may be signs of sinusitis.  You develop a fever, swollen neck glands, pain with swallowing, or white areas in the back of your throat. These may be signs of strep throat. SEEK IMMEDIATE MEDICAL CARE IF:   You have a fever.  You develop severe or persistent headache, ear pain, sinus pain, or chest pain.  You develop wheezing, a prolonged cough, cough up blood, or have a change in your usual mucus (if you have chronic lung disease).  You develop sore muscles or a stiff neck. Document Released: 09/26/2000 Document Revised: 06/25/2011 Document Reviewed: 07/08/2013 [facility] Patient Information 2015 [place], [place]. This information is not intended to replace advice given to you by your health care provider. Make sure you discuss any questions you have with your health care provider.

## 2014-12-13 NOTE — ED Provider Notes (Signed)
CSN: [id number]     Arrival date & time 12/13/14  1729 History   First MD Initiated Contact with Patient 12/13/14 2029     Chief Complaint  Patient presents with  . Dizziness    "woke up with it"  . Nausea    since this AM     (Consider location/radiation/quality/duration/timing/severity/associated sxs/prior Treatment) Patient is a 34 y.o. male presenting with dizziness. The history is provided by the patient. No language interpreter was used.  Dizziness Associated symptoms: no chest pain and no vomiting    [name] is a 34 year old male with a history of asthma who presents for dizziness which she describes as "everything was spinning" and feeling nauseated.  He states he generally does not feel well and has a sore throat with a cough and nasal congestion. He is also complaining of some mild shortness of breath but states that it is usually controlled with his albuterol and Advair. He denies taking anything prior to arrival. He denies any fever, chest pain, abdominal pain, vomiting, diarrhea.  Past Medical History  Diagnosis Date  . Asthma    History reviewed. No pertinent past surgical history. No family history on file. Social History  Substance Use Topics  . Smoking status: Current Every Day Smoker  . Smokeless tobacco: None  . Alcohol Use: Yes     Comment: occ    Review of Systems  Constitutional: Positive for chills. Negative for fever.  HENT: Positive for sore throat.   Respiratory: Positive for cough. Negative for wheezing.   Cardiovascular: Negative for chest pain.  Gastrointestinal: Negative for vomiting and abdominal pain.  Neurological: Positive for dizziness.  All other systems reviewed and are negative.     Allergies  Review of patient's allergies indicates no known allergies.  Home Medications   Prior to Admission medications   Medication Sig Start Date End Date Taking? Authorizing Provider  albuterol (PROVENTIL HFA;VENTOLIN HFA) 108 (90 BASE)  MCG/ACT inhaler Inhale 2 puffs into the lungs every 4 (four) hours as needed for wheezing or shortness of breath. 09/13/14  Yes Gilda Crease, MD  Fluticasone-Salmeterol (ADVAIR DISKUS) 250-50 MCG/DOSE AEPB Inhale 1 puff into the lungs 2 (two) times daily. 09/14/14  Yes Gilda Crease, MD  ibuprofen (ADVIL,MOTRIN) 800 MG tablet Take 1 tablet (800 mg total) by mouth 3 (three) times daily. 09/29/14  Yes Elpidio Anis, PA-C  predniSONE (DELTASONE) 50 MG tablet Take 1 tablet (50 mg total) by mouth daily with breakfast. 12/08/14  Yes Charlestine Night, PA-C  HYDROcodone-homatropine (HYCODAN) 5-1.5 MG/5ML syrup Take 5 mLs by mouth every 6 (six) hours as needed for cough. 12/13/14   Emerlyn Mehlhoff Patel-Mills, PA-C   BP 111/77 mmHg  Pulse 80  Temp(Src) 98.1 F (36.7 C) (Oral)  Resp 17  SpO2 95% Physical Exam  Constitutional: He is oriented to person, place, and time. He appears well-developed and well-nourished.  HENT:  Head: Normocephalic and atraumatic.  Mouth/Throat: Uvula is midline and mucous membranes are normal. No trismus in the jaw. No uvula swelling. Posterior oropharyngeal erythema present. No oropharyngeal exudate, posterior oropharyngeal edema or tonsillar abscesses.  No drooling or trismus. Patent airway.  Eyes: Conjunctivae are normal.  Neck: Normal range of motion. Neck supple.  Cardiovascular: Normal rate, regular rhythm and normal heart sounds.   Pulmonary/Chest: Effort normal and breath sounds normal. No accessory muscle usage. No respiratory distress. He has no wheezes. He has no rales.  No decreased breath sounds or wheezing.  Musculoskeletal: Normal range of motion.  Neurological: He is alert and oriented to person, place, and time.  Skin: Skin is warm and dry.  Psychiatric: He has a normal mood and affect. His behavior is normal.  Nursing note and vitals reviewed.   ED Course  Procedures (including critical care time) Labs Review Labs Reviewed  BASIC METABOLIC  PANEL - Abnormal; Notable for the following:    Creatinine, Ser 1.34 (*)    All other components within normal limits  URINALYSIS, ROUTINE W REFLEX MICROSCOPIC (NOT AT [facility]) - Abnormal; Notable for the following:    Specific Gravity, Urine 1.033 (*)    Ketones, ur 15 (*)    All other components within normal limits  RAPID STREP SCREEN (NOT AT [facility])  CULTURE, GROUP A STREP  CBC    Imaging Review Dg Chest 2 View  12/13/2014   CLINICAL DATA:  Chest pain, dyspnea and lightheadedness.  EXAM: CHEST  2 VIEW  COMPARISON:  None.  FINDINGS: The heart size and mediastinal contours are within normal limits. Both lungs are clear. The visualized skeletal structures are unremarkable.  IMPRESSION: No active cardiopulmonary disease.   Electronically Signed   By: Ellery Plunk M.D.   On: 12/13/2014 21:13   I have personally reviewed and evaluated these images and lab results as part of my medical decision-making.   EKG Interpretation None      MDM   Final diagnoses:  Viral upper respiratory illness   Patient presents for upper respiratory complaints. Labs are unremarkable. Strep is negative. Chest x-ray shows no signs of pneumonia or pneumothorax.  Medications  HYDROcodone-homatropine (HYCODAN) 5-1.5 MG/5ML syrup 5 mL (5 mLs Oral Given 12/13/14 2212)  lidocaine (XYLOCAINE) 2 % viscous mouth solution 15 mL (15 mLs Mouth/Throat Given 12/13/14 2212)  Recheck: Patient feels better. I'm given him Hycodan to go home with for cough. I discussed return precautions and following up with a physician. He verbally agrees with the plan.     Catha Gosselin, PA-C 12/13/14 2320  Azalia Bilis, MD 12/14/14 [phone]

## 2014-12-13 NOTE — ED Notes (Signed)
Pt A+ox4, sts awoke this AM with c/o dizziness "everything was spinning", nausea, sore throat, cough/nasal congestion and feeling generally unwell.  Pt denies cp/palpitations or SOB.  Speaking full/clear sentences, rr even/un-lab.  Skin PWD.  MAEI, ambulatory with steady gait.  Neuros grossly intact.  NAD.

## 2014-12-18 LAB — CULTURE, GROUP A STREP: Strep A Culture: NEGATIVE

## 2015-01-12 ENCOUNTER — Emergency Department (HOSPITAL_COMMUNITY)
Admission: EM | Admit: 2015-01-12 | Discharge: 2015-01-12 | Disposition: A | Discharge status: Home / Self Care | Payer: Medicaid Other | Attending: Emergency Medicine | Admitting: Emergency Medicine

## 2015-01-12 ENCOUNTER — Emergency Department (HOSPITAL_COMMUNITY)
Admission: EM | Admit: 2015-01-12 | Discharge: 2015-01-12 | Disposition: A | Discharge status: Home / Self Care | Payer: Medicaid Other | Source: Home / Self Care | Attending: Emergency Medicine | Admitting: Emergency Medicine

## 2015-01-12 ENCOUNTER — Encounter (HOSPITAL_COMMUNITY): Payer: Self-pay

## 2015-01-12 ENCOUNTER — Emergency Department (HOSPITAL_COMMUNITY): Payer: Medicaid Other

## 2015-01-12 ENCOUNTER — Encounter (HOSPITAL_COMMUNITY): Payer: Self-pay | Admitting: *Deleted

## 2015-01-12 DIAGNOSIS — J45901 Unspecified asthma with (acute) exacerbation: Secondary | ICD-10-CM

## 2015-01-12 DIAGNOSIS — Z720 Tobacco use: Secondary | ICD-10-CM | POA: Diagnosis not present

## 2015-01-12 DIAGNOSIS — Z79899 Other long term (current) drug therapy: Secondary | ICD-10-CM | POA: Diagnosis not present

## 2015-01-12 DIAGNOSIS — R0602 Shortness of breath: Secondary | ICD-10-CM | POA: Diagnosis present

## 2015-01-12 DIAGNOSIS — Z791 Long term (current) use of non-steroidal anti-inflammatories (NSAID): Secondary | ICD-10-CM | POA: Insufficient documentation

## 2015-01-12 LAB — CBC
HCT: 46.2 % (ref 39.0–52.0)
HEMOGLOBIN: 15.4 g/dL (ref 13.0–17.0)
MCH: 28.7 pg (ref 26.0–34.0)
MCHC: 33.3 g/dL (ref 30.0–36.0)
MCV: 86.2 fL (ref 78.0–100.0)
Platelets: 226 10*3/uL (ref 150–400)
RBC: 5.36 MIL/uL (ref 4.22–5.81)
RDW: 13.5 % (ref 11.5–15.5)
WBC: 6.7 10*3/uL (ref 4.0–10.5)

## 2015-01-12 LAB — BASIC METABOLIC PANEL
Anion gap: 9 (ref 5–15)
BUN: 14 mg/dL (ref 6–20)
CALCIUM: 9.3 mg/dL (ref 8.9–10.3)
CHLORIDE: 107 mmol/L (ref 101–111)
CO2: 26 mmol/L (ref 22–32)
CREATININE: 1.19 mg/dL (ref 0.61–1.24)
GFR calc non Af Amer: >60 mL/min (ref >60)
Glucose, Bld: 78 mg/dL (ref 65–99)
Potassium: 4.1 mmol/L (ref 3.5–5.1)
SODIUM: 142 mmol/L (ref 135–145)

## 2015-01-12 MED ORDER — PREDNISONE 20 MG PO TABS: 40.0000 mg | ORAL_TABLET | Freq: Every day | ORAL | Status: DC

## 2015-01-12 MED ORDER — ALBUTEROL SULFATE HFA 108 (90 BASE) MCG/ACT IN AERS
2.0000 | INHALATION_SPRAY | Freq: Once | RESPIRATORY_TRACT | Status: AC
  Administered 2015-01-12: 2 via RESPIRATORY_TRACT
  Filled 2015-01-12: qty 6.7

## 2015-01-12 MED ORDER — IPRATROPIUM-ALBUTEROL 0.5-2.5 (3) MG/3ML IN SOLN
RESPIRATORY_TRACT | Status: AC
  Administered 2015-01-12: 3 mL via RESPIRATORY_TRACT
  Filled 2015-01-12: qty 3

## 2015-01-12 MED ORDER — IPRATROPIUM-ALBUTEROL 0.5-2.5 (3) MG/3ML IN SOLN
3.0000 mL | Freq: Once | RESPIRATORY_TRACT | Status: AC
  Administered 2015-01-12: 3 mL via RESPIRATORY_TRACT

## 2015-01-12 MED ORDER — METHYLPREDNISOLONE SODIUM SUCC 125 MG IJ SOLR
125.0000 mg | Freq: Once | INTRAMUSCULAR | Status: AC
  Administered 2015-01-12: 125 mg via INTRAVENOUS
  Filled 2015-01-12: qty 2

## 2015-01-12 MED ORDER — ALBUTEROL SULFATE HFA 108 (90 BASE) MCG/ACT IN AERS: 2.0000 | INHALATION_SPRAY | Freq: Four times a day (QID) | RESPIRATORY_TRACT | Status: DC | PRN

## 2015-01-12 MED ORDER — IPRATROPIUM-ALBUTEROL 0.5-2.5 (3) MG/3ML IN SOLN
3.0000 mL | Freq: Once | RESPIRATORY_TRACT | Status: AC
  Administered 2015-01-12: 3 mL via RESPIRATORY_TRACT
  Filled 2015-01-12: qty 3

## 2015-01-12 NOTE — Discharge Instructions (Signed)
Please follow up with your primary care physician in 1-2 days. If you do not have one please call the [facility] number listed above. Please read all discharge instructions and return precautions.  ° ° °Asthma °Asthma is a recurring condition in which the airways tighten and narrow. Asthma can make it difficult to breathe. It can cause coughing, wheezing, and shortness of breath. Asthma episodes, also called asthma attacks, range from minor to life-threatening. Asthma cannot be cured, but medicines and lifestyle changes can help control it. °CAUSES °Asthma is believed to be caused by inherited (genetic) and environmental factors, but its exact cause is unknown. Asthma may be triggered by allergens, lung infections, or irritants in the air. Asthma triggers are different for each person. Common triggers include:  °· Animal dander. °· Dust mites. °· Cockroaches. °· Pollen from trees or grass. °· Mold. °· Smoke. °· Air pollutants such as dust, household cleaners, hair sprays, aerosol sprays, paint fumes, strong chemicals, or strong odors. °· Cold air, weather changes, and winds (which increase molds and pollens in the air). °· Strong emotional expressions such as crying or laughing hard. °· Stress. °· Certain medicines (such as aspirin) or types of drugs (such as beta-blockers). °· Sulfites in foods and drinks. Foods and drinks that may contain sulfites include dried fruit, potato chips, and sparkling grape juice. °· Infections or inflammatory conditions such as the flu, a cold, or an inflammation of the nasal membranes (rhinitis). °· Gastroesophageal reflux disease (GERD). °· Exercise or strenuous activity. °SYMPTOMS °Symptoms may occur immediately after asthma is triggered or many hours later. Symptoms include: °· Wheezing. °· Excessive nighttime or early morning coughing. °· Frequent or severe coughing with a common cold. °· Chest tightness. °· Shortness of breath. °DIAGNOSIS  °The diagnosis of  asthma is made by a review of your medical history and a physical exam. Tests may also be performed. These may include: °· Lung function studies. These tests show how much air you breathe in and out. °· Allergy tests. °· Imaging tests such as X-rays. °TREATMENT  °Asthma cannot be cured, but it can usually be controlled. Treatment involves identifying and avoiding your asthma triggers. It also involves medicines. There are 2 classes of medicine used for asthma treatment:  °· Controller medicines. These prevent asthma symptoms from occurring. They are usually taken every day. °· Reliever or rescue medicines. These quickly relieve asthma symptoms. They are used as needed and provide short-term relief. °Your health care provider will help you create an asthma action plan. An asthma action plan is a written plan for managing and treating your asthma attacks. It includes a list of your asthma triggers and how they may be avoided. It also includes information on when medicines should be taken and when their dosage should be changed. An action plan may also involve the use of a device called a peak flow meter. A peak flow meter measures how well the lungs are working. It helps you monitor your condition. °HOME CARE INSTRUCTIONS  °· Take medicines only as directed by your health care provider. Speak with your health care provider if you have questions about how or when to take the medicines. °· Use a peak flow meter as directed by your health care provider. Record and keep track of readings. °· Understand and use the action plan to help minimize or stop an asthma attack without needing to seek medical care. °· Control your home environment in the following ways to help prevent   asthma attacks: °¨ Do not smoke. Avoid being exposed to secondhand smoke. °¨ Change your heating and air conditioning filter regularly. °¨ Limit your use of fireplaces and wood stoves. °¨ Get rid of pests (such as roaches and mice) and their  droppings. °¨ Throw away plants if you see mold on them. °¨ Clean your floors and dust regularly. Use unscented cleaning products. °¨ Try to have someone else vacuum for you regularly. Stay out of rooms while they are being vacuumed and for a short while afterward. If you vacuum, use a dust mask from a hardware store, a double-layered or microfilter vacuum cleaner bag, or a vacuum cleaner with a HEPA filter. °¨ Replace carpet with wood, tile, or vinyl flooring. Carpet can trap dander and dust. °¨ Use allergy-proof pillows, mattress covers, and box spring covers. °¨ Wash bed sheets and blankets every week in hot water and dry them in a dryer. °¨ Use blankets that are made of polyester or cotton. °¨ Clean bathrooms and kitchens with bleach. If possible, have someone repaint the walls in these rooms with mold-resistant paint. Keep out of the rooms that are being cleaned and painted. °¨ Wash hands frequently. °SEEK MEDICAL CARE IF:  °· You have wheezing, shortness of breath, or a cough even if taking medicine to prevent attacks. °· The colored mucus you cough up (sputum) is thicker than usual. °· Your sputum changes from clear or white to yellow, green, gray, or bloody. °· You have any problems that may be related to the medicines you are taking (such as a rash, itching, swelling, or trouble breathing). °· You are using a reliever medicine more than 2-3 times per week. °· Your peak flow is still at 50-79% of your personal best after following your action plan for 1 hour. °· You have a fever. °SEEK IMMEDIATE MEDICAL CARE IF:  °· You seem to be getting worse and are unresponsive to treatment during an asthma attack. °· You are short of breath even at rest. °· You get short of breath when doing very little physical activity. °· You have difficulty eating, drinking, or talking due to asthma symptoms. °· You develop chest pain. °· You develop a fast heartbeat. °· You have a bluish color to your lips or fingernails. °· You  are light-headed, dizzy, or faint. °· Your peak flow is less than 50% of your personal best. °MAKE SURE YOU:  °· Understand these instructions. °· Will watch your condition. °· Will get help right away if you are not doing well or get worse. °Document Released: 04/02/2005 Document Revised: 08/17/2013 Document Reviewed: 10/30/2012 °[employer]® Patient Information ©2015 [employer]. This information is not intended to replace advice given to you by your health care provider. Make sure you discuss any questions you have with your health care provider. ° °

## 2015-01-12 NOTE — ED Notes (Signed)
Pt reports SOB starting this morning. Pt has hx of asthma. Pt with decreased lung sounds in triage.

## 2015-01-12 NOTE — ED Notes (Signed)
Pt needs to get to work.  Pt denies chest pain.  Told to come back if symptoms worsen.

## 2015-01-12 NOTE — ED Notes (Signed)
PA at bedside.

## 2015-01-12 NOTE — ED Notes (Signed)
Pt ambulated with pulse ox, sats remained at 97%-99%, pt denies shortness of breath during ambulation. Minimal wheezing noted to R lower lobe.

## 2015-01-12 NOTE — ED Notes (Signed)
Per EMS, Pt from side of road.  Does have home.  Pt has had SOB since this morning.  Pt seen this morning and checked out.  Pt then refused transport.  Later called EMS again for same complaint.  Albuterol/atrovent given.  Symptoms started this morning.  Productive cough.  Vitals: 122/63, hr 98, resp 18, 97% with treatment.

## 2015-01-12 NOTE — ED Notes (Addendum)
Pt now noted to have bilateral wheezing after treatment

## 2015-01-12 NOTE — ED Provider Notes (Signed)
CSN: [id number]     Arrival date & time 01/12/15  1757 History   First MD Initiated Contact with Patient 01/12/15 1823     Chief Complaint  Patient presents with  . Shortness of Breath  . Asthma     (Consider location/radiation/quality/duration/timing/severity/associated sxs/prior Treatment) HPI Comments: Patient is a 34 year old male past medical history significant for asthma, tobacco abuse presenting to the emergency department for acute onset asthma exacerbation. He states he woke the symptoms this morning endorsing chest tightness, shortness of breath, wheezing. He does not have an inhaler at home. No modifying factors. He does endorse subjective fever and chills with a few days of upper respiratory symptoms. No history of hospitalizations for asthma exacerbation.  Patient is a 34 y.o. male presenting with shortness of breath and asthma.  Shortness of Breath Associated symptoms: cough and wheezing   Asthma Associated symptoms include chills and coughing.    Past Medical History  Diagnosis Date  . Asthma    History reviewed. No pertinent past surgical history. No family history on file. Social History  Substance Use Topics  . Smoking status: Current Every Day Smoker  . Smokeless tobacco: None  . Alcohol Use: Yes     Comment: occ    Review of Systems  Constitutional: Positive for chills.  Respiratory: Positive for cough, chest tightness, shortness of breath and wheezing.   All other systems reviewed and are negative.     Allergies  Review of patient's allergies indicates no known allergies.  Home Medications   Prior to Admission medications   Medication Sig Start Date End Date Taking? Authorizing Provider  albuterol (PROVENTIL HFA;VENTOLIN HFA) 108 (90 BASE) MCG/ACT inhaler Inhale 2 puffs into the lungs every 4 (four) hours as needed for wheezing or shortness of breath. 09/13/14   Gilda Crease, MD  albuterol (PROVENTIL HFA;VENTOLIN HFA) 108 (90 BASE)  MCG/ACT inhaler Inhale 2 puffs into the lungs every 6 (six) hours as needed for wheezing or shortness of breath. 01/12/15   Jennifer Piepenbrink, PA-C  Fluticasone-Salmeterol (ADVAIR DISKUS) 250-50 MCG/DOSE AEPB Inhale 1 puff into the lungs 2 (two) times daily. 09/14/14   Gilda Crease, MD  HYDROcodone-homatropine [facility]) 5-1.5 MG/5ML syrup Take 5 mLs by mouth every 6 (six) hours as needed for cough. 12/13/14   Hanna Patel-Mills, PA-C  ibuprofen (ADVIL,MOTRIN) 800 MG tablet Take 1 tablet (800 mg total) by mouth 3 (three) times daily. 09/29/14   Elpidio Anis, PA-C  predniSONE (DELTASONE) 20 MG tablet Take 2 tablets (40 mg total) by mouth daily. 01/12/15   Jennifer Piepenbrink, PA-C  predniSONE (DELTASONE) 50 MG tablet Take 1 tablet (50 mg total) by mouth daily with breakfast. 12/08/14   Charlestine Night, PA-C   BP 108/77 mmHg  Pulse 75  Temp(Src) 98.5 F (36.9 C) (Oral)  Resp 22  SpO2 99% Physical Exam  Constitutional: He is oriented to person, place, and time. He appears well-developed and well-nourished. No distress.  HENT:  Head: Normocephalic and atraumatic.  Right Ear: External ear normal.  Left Ear: External ear normal.  Nose: Nose normal.  Mouth/Throat: Oropharynx is clear and moist. No oropharyngeal exudate.  Eyes: Conjunctivae are normal.  Neck: Neck supple.  Cardiovascular: Normal rate, regular rhythm and normal heart sounds.   Pulmonary/Chest: No respiratory distress. He has wheezes. He exhibits tenderness.  Inspiratory and expiratory wheezing. Prolonged expiratory phase. Exam performed after first albuterol treatment.   Abdominal: Soft. There is no tenderness.  Musculoskeletal: Normal range of motion.  Neurological: He  is alert and oriented to person, place, and time.  Skin: Skin is warm and dry. He is not diaphoretic.  Nursing note and vitals reviewed.   ED Course  Procedures (including critical care time) Medications  ipratropium-albuterol (DUONEB) 0.5-2.5 (3)  MG/3ML nebulizer solution 3 mL (3 mLs Nebulization Given 01/12/15 1812)  ipratropium-albuterol (DUONEB) 0.5-2.5 (3) MG/3ML nebulizer solution 3 mL (3 mLs Nebulization Given 01/12/15 1846)  methylPREDNISolone sodium succinate (SOLU-MEDROL) 125 mg/2 mL injection 125 mg (125 mg Intravenous Given 01/12/15 1846)  albuterol (PROVENTIL HFA;VENTOLIN HFA) 108 (90 BASE) MCG/ACT inhaler 2 puff (2 puffs Inhalation Given 01/12/15 2129)    Labs Review Labs Reviewed  CBC  BASIC METABOLIC PANEL    Imaging Review Dg Chest 2 View  01/12/2015   CLINICAL DATA:  Shortness of breath.  Wheezing.  EXAM: CHEST  2 VIEW  COMPARISON:  Chest radiograph from earlier today.  FINDINGS: Stable cardiomediastinal silhouette with normal heart size. No pneumothorax. No pleural effusion. Lungs are mildly hyperinflated. Lungs appear clear, with no pulmonary edema and no focal lung consolidation. Visualized osseous structures appear intact.  IMPRESSION: Mildly hyperinflated lungs, suggesting nonspecific obstructive airway disease. Lungs appear clear, with no focal lung consolidation to suggest a pneumonia.   Electronically Signed   By: Delbert Phenix M.D.   On: 01/12/2015 20:27   Dg Chest 2 View  01/12/2015   CLINICAL DATA:  Productive cough, chest pain, shortness of breath  EXAM: CHEST  2 VIEW  COMPARISON:  12/13/2014  FINDINGS: The heart size and mediastinal contours are within normal limits. Both lungs are clear. The visualized skeletal structures are unremarkable.  IMPRESSION: No active cardiopulmonary disease.   Electronically Signed   By: Elige Ko   On: 01/12/2015 15:50   I have personally reviewed and evaluated these images and lab results as part of my medical decision-making.   EKG Interpretation None      8:09 PM On re-evaluation patient feeling better. Slight expiratory wheezing noted.    MDM   Final diagnoses:  Asthma exacerbation    Filed Vitals:   01/12/15 2112  BP: 108/77  Pulse: 75  Temp: 98.5 F (36.9  C)  Resp: 22   Afebrile, NAD, non-toxic appearing, AAOx4 appropriate for age.   Patient ambulated in ED with O2 saturations maintained >90, no current signs of respiratory distress. Lung exam improved after nebulizer treatment. Prednisone given in the ED and pt will bd dc with 5 day burst. Pt states they are breathing at baseline. Pt has been instructed to continue using prescribed medications and to speak with PCP about today's exacerbation. Patient is stable at time of discharge      Francee Piccolo, PA-C 01/12/15 2132  Lavera Guise, MD 01/13/15 [phone]

## 2015-01-12 NOTE — Progress Notes (Signed)
pcp is [facility] [address] [place], [place] [address] [phone]

## 2015-01-16 ENCOUNTER — Encounter (HOSPITAL_COMMUNITY): Payer: Self-pay | Admitting: *Deleted

## 2015-01-16 ENCOUNTER — Emergency Department (HOSPITAL_COMMUNITY)
Admission: EM | Admit: 2015-01-16 | Discharge: 2015-01-16 | Disposition: A | Discharge status: Home / Self Care | Payer: Medicaid Other | Attending: Emergency Medicine | Admitting: Emergency Medicine

## 2015-01-16 ENCOUNTER — Emergency Department (HOSPITAL_COMMUNITY): Payer: Medicaid Other

## 2015-01-16 DIAGNOSIS — Z79899 Other long term (current) drug therapy: Secondary | ICD-10-CM | POA: Diagnosis not present

## 2015-01-16 DIAGNOSIS — J159 Unspecified bacterial pneumonia: Secondary | ICD-10-CM | POA: Insufficient documentation

## 2015-01-16 DIAGNOSIS — Z720 Tobacco use: Secondary | ICD-10-CM | POA: Insufficient documentation

## 2015-01-16 DIAGNOSIS — J45901 Unspecified asthma with (acute) exacerbation: Secondary | ICD-10-CM | POA: Insufficient documentation

## 2015-01-16 DIAGNOSIS — Z791 Long term (current) use of non-steroidal anti-inflammatories (NSAID): Secondary | ICD-10-CM | POA: Diagnosis not present

## 2015-01-16 DIAGNOSIS — Z7952 Long term (current) use of systemic steroids: Secondary | ICD-10-CM | POA: Insufficient documentation

## 2015-01-16 DIAGNOSIS — J189 Pneumonia, unspecified organism: Secondary | ICD-10-CM

## 2015-01-16 DIAGNOSIS — R0600 Dyspnea, unspecified: Secondary | ICD-10-CM | POA: Diagnosis present

## 2015-01-16 MED ORDER — AZITHROMYCIN 250 MG PO TABS
500.0000 mg | ORAL_TABLET | Freq: Once | ORAL | Status: AC
  Administered 2015-01-16: 500 mg via ORAL
  Filled 2015-01-16: qty 2

## 2015-01-16 MED ORDER — AZITHROMYCIN 250 MG PO TABS: 250.0000 mg | ORAL_TABLET | Freq: Every day | ORAL | Status: DC

## 2015-01-16 MED ORDER — IPRATROPIUM-ALBUTEROL 0.5-2.5 (3) MG/3ML IN SOLN
3.0000 mL | RESPIRATORY_TRACT | Status: AC
  Administered 2015-01-16 (×3): 3 mL via RESPIRATORY_TRACT
  Filled 2015-01-16: qty 3
  Filled 2015-01-16: qty 6

## 2015-01-16 NOTE — Discharge Instructions (Signed)

## 2015-01-16 NOTE — ED Notes (Signed)
Has had respiratory infection x 3 days. Was seen and given prednisone at [facility] ED for same. Attempted to work today, has asthma attack at work. Wheezing per EMS. Has had 5 mg Albuterol, following neb still wheezing on Right. Getting Duoneb now. Has had 125 solumedrol from EMS as well. C/o chest tightness with cough and deep inspiration.  EKG NSR with [facility].

## 2015-01-16 NOTE — ED Provider Notes (Signed)
CSN: [id number]     Arrival date & time 01/16/15  1300 History   First MD Initiated Contact with Patient 01/16/15 1342     Chief Complaint  Patient presents with  . Respiratory Distress     (Consider location/radiation/quality/duration/timing/severity/associated sxs/prior Treatment) Patient is a 34 y.o. male presenting with general illness. The history is provided by the patient.  Illness Severity:  Moderate Onset quality:  Gradual Duration:  4 days Timing:  Constant Progression:  Worsening Chronicity:  New Associated symptoms: congestion, cough and fever   Associated symptoms: no abdominal pain, no chest pain, no diarrhea, no headaches, no myalgias, no nausea, no rash, no shortness of breath and no vomiting     34 yo M with a chief complaint of cough congestion and fever. This been going on for about 4 days. Patient was seen in the ED couple days ago with similar complaints. Had a negative chest x-ray was given multiple nebulized medicines and started on steroids. Patient states he's continued to have worsening of his symptoms. Has been using his steroids and inhalers as prescribed. Continues to have subjective fevers and chills.  Past Medical History  Diagnosis Date  . Asthma    History reviewed. No pertinent past surgical history. History reviewed. No pertinent family history. Social History  Substance Use Topics  . Smoking status: Current Every Day Smoker  . Smokeless tobacco: None  . Alcohol Use: Yes     Comment: occ    Review of Systems  Constitutional: Positive for fever. Negative for chills.  HENT: Positive for congestion. Negative for facial swelling.   Eyes: Negative for discharge and visual disturbance.  Respiratory: Positive for cough. Negative for shortness of breath.   Cardiovascular: Negative for chest pain and palpitations.  Gastrointestinal: Negative for nausea, vomiting, abdominal pain and diarrhea.  Musculoskeletal: Negative for myalgias and  arthralgias.  Skin: Negative for color change and rash.  Neurological: Negative for tremors, syncope and headaches.  Psychiatric/Behavioral: Negative for confusion and dysphoric mood.      Allergies  Review of patient's allergies indicates no known allergies.  Home Medications   Prior to Admission medications   Medication Sig Start Date End Date Taking? Authorizing Provider  albuterol (PROVENTIL HFA;VENTOLIN HFA) 108 (90 BASE) MCG/ACT inhaler Inhale 2 puffs into the lungs every 6 (six) hours as needed for wheezing or shortness of breath. 01/12/15  Yes Jennifer Piepenbrink, PA-C  ibuprofen (ADVIL,MOTRIN) 800 MG tablet Take 1 tablet (800 mg total) by mouth 3 (three) times daily. 09/29/14  Yes Shari Upstill, PA-C  predniSONE (DELTASONE) 20 MG tablet Take 2 tablets (40 mg total) by mouth daily. 01/12/15  Yes Jennifer Piepenbrink, PA-C  albuterol (PROVENTIL HFA;VENTOLIN HFA) 108 (90 BASE) MCG/ACT inhaler Inhale 2 puffs into the lungs every 4 (four) hours as needed for wheezing or shortness of breath. Patient not taking: Reported on 01/16/2015 09/13/14   Gilda Crease, MD  azithromycin (ZITHROMAX) 250 MG tablet Take 1 tablet (250 mg total) by mouth daily. Take 1 tab every day for four days 01/16/15   Melene Plan, DO  Fluticasone-Salmeterol (ADVAIR DISKUS) 250-50 MCG/DOSE AEPB Inhale 1 puff into the lungs 2 (two) times daily. Patient not taking: Reported on 01/16/2015 09/14/14   Gilda Crease, MD  HYDROcodone-homatropine [facility]) 5-1.5 MG/5ML syrup Take 5 mLs by mouth every 6 (six) hours as needed for cough. Patient not taking: Reported on 01/16/2015 12/13/14   Catha Gosselin, PA-C  predniSONE (DELTASONE) 50 MG tablet Take 1 tablet (50 mg total)  by mouth daily with breakfast. Patient not taking: Reported on 01/16/2015 12/08/14   Charlestine Night, PA-C   BP 127/82 mmHg  Pulse 94  Resp 20  SpO2 100% Physical Exam  Constitutional: He is oriented to person, place, and time. He appears  well-developed and well-nourished.  HENT:  Head: Normocephalic and atraumatic.  Eyes: EOM are normal. Pupils are equal, round, and reactive to light.  Neck: Normal range of motion. Neck supple. No JVD present.  Cardiovascular: Normal rate and regular rhythm.  Exam reveals no gallop and no friction rub.   No murmur heard. Pulmonary/Chest: No respiratory distress. He has wheezes (diffuse, end epiratory). He has no rales. He exhibits no tenderness.  Diffusely diminished breathsounds  Abdominal: He exhibits no distension. There is no tenderness. There is no rebound and no guarding.  Musculoskeletal: Normal range of motion.  Neurological: He is alert and oriented to person, place, and time.  Skin: No rash noted. No pallor.  Psychiatric: He has a normal mood and affect. His behavior is normal.    ED Course  Procedures (including critical care time) Labs Review Labs Reviewed - No data to display  Imaging Review Dg Chest 2 View  01/16/2015   CLINICAL DATA:  Cough.  Shortness of breath.  EXAM: CHEST  2 VIEW  COMPARISON:  01/12/2015 chest radiograph  FINDINGS: Stable cardiomediastinal silhouette with normal heart size. No pneumothorax. No pleural effusion. Clear lungs, with no focal lung consolidation and no pulmonary edema. The lungs are not significantly hyperinflated.  IMPRESSION: No active cardiopulmonary disease.   Electronically Signed   By: Delbert Phenix M.D.   On: 01/16/2015 14:31   I have personally reviewed and evaluated these images and lab results as part of my medical decision-making.   EKG Interpretation   Date/Time:  Sunday January 16 2015 13:38:40 EDT Ventricular Rate:  97 PR Interval:  144 QRS Duration: 97 QT Interval:  356 QTC Calculation: 452 R Axis:   78 Text Interpretation:  Sinus rhythm Right atrial enlargement No significant  change since last tracing Confirmed by Soraya Paquette MD, Reuel Boom ([id number]) on  01/16/2015 3:41:36 PM      MDM   Final diagnoses:  CAP (community  acquired pneumonia)    34 yo M with a chief complaint of cough and fever. Chest x-ray viewed by me concerning for atypical pneumonia. We'll treat him as such. Given first dose of azithromycin here will continue for 4 days. Patient improved with 3 DuoNeb's. PCP follow-up. We'll have him continue to take his oral steroids.  3:44 PM:  I have discussed the diagnosis/risks/treatment options with the patient and family and believe the pt to be eligible for discharge home to follow-up with PCP. We also discussed returning to the ED immediately if new or worsening sx occur. We discussed the sx which are most concerning (e.g., worsening sob) that necessitate immediate return. Medications administered to the patient during their visit and any new prescriptions provided to the patient are listed below.  Medications given during this visit Medications  ipratropium-albuterol (DUONEB) 0.5-2.5 (3) MG/3ML nebulizer solution 3 mL (3 mLs Nebulization Given 01/16/15 1512)  azithromycin (ZITHROMAX) tablet 500 mg (500 mg Oral Given 01/16/15 1512)    Discharge Medication List as of 01/16/2015  2:49 PM    START taking these medications   Details  azithromycin (ZITHROMAX) 250 MG tablet Take 1 tablet (250 mg total) by mouth daily. Take 1 tab every day for four days, Starting 01/16/2015, Until Discontinued, Print  The patient appears reasonably screen and/or stabilized for discharge and I doubt any other medical condition or other [facility] requiring further screening, evaluation, or treatment in the ED at this time prior to discharge.      Melene Plan, DO 01/16/15 1544

## 2015-01-16 NOTE — ED Notes (Signed)
Bed: KG88 Expected date:  Expected time:  Means of arrival:  Comments: (34 yo) EMS

## 2015-07-07 ENCOUNTER — Emergency Department (HOSPITAL_COMMUNITY)
Admission: EM | Admit: 2015-07-07 | Discharge: 2015-07-07 | Disposition: A | Discharge status: Home / Self Care | Payer: Medicaid Other | Attending: Emergency Medicine | Admitting: Emergency Medicine

## 2015-07-07 ENCOUNTER — Emergency Department (HOSPITAL_COMMUNITY): Payer: Medicaid Other

## 2015-07-07 ENCOUNTER — Encounter (HOSPITAL_COMMUNITY): Payer: Self-pay | Admitting: *Deleted

## 2015-07-07 DIAGNOSIS — Z79899 Other long term (current) drug therapy: Secondary | ICD-10-CM | POA: Diagnosis not present

## 2015-07-07 DIAGNOSIS — R000 Tachycardia, unspecified: Secondary | ICD-10-CM | POA: Insufficient documentation

## 2015-07-07 DIAGNOSIS — J40 Bronchitis, not specified as acute or chronic: Secondary | ICD-10-CM

## 2015-07-07 DIAGNOSIS — J9801 Acute bronchospasm: Secondary | ICD-10-CM

## 2015-07-07 DIAGNOSIS — F17200 Nicotine dependence, unspecified, uncomplicated: Secondary | ICD-10-CM | POA: Insufficient documentation

## 2015-07-07 DIAGNOSIS — J45901 Unspecified asthma with (acute) exacerbation: Secondary | ICD-10-CM | POA: Insufficient documentation

## 2015-07-07 DIAGNOSIS — R0602 Shortness of breath: Secondary | ICD-10-CM | POA: Diagnosis present

## 2015-07-07 MED ORDER — KETOROLAC TROMETHAMINE 30 MG/ML IJ SOLN
30.0000 mg | Freq: Once | INTRAMUSCULAR | Status: AC
Start: 2015-07-07 — End: 2015-07-07
  Administered 2015-07-07: 30 mg via INTRAVENOUS
  Filled 2015-07-07: qty 1

## 2015-07-07 MED ORDER — PREDNISONE 10 MG PO TABS: 60.0000 mg | ORAL_TABLET | Freq: Every day | ORAL | Status: DC

## 2015-07-07 MED ORDER — METHYLPREDNISOLONE SODIUM SUCC 125 MG IJ SOLR: 125.0000 mg | Freq: Once | INTRAMUSCULAR | Status: DC

## 2015-07-07 MED ORDER — ALBUTEROL SULFATE HFA 108 (90 BASE) MCG/ACT IN AERS
2.0000 | INHALATION_SPRAY | RESPIRATORY_TRACT | Status: DC
  Administered 2015-07-07: 2 via RESPIRATORY_TRACT
  Filled 2015-07-07: qty 6.7

## 2015-07-07 MED ORDER — SODIUM CHLORIDE 0.9 % IV BOLUS (SEPSIS)
1000.0000 mL | Freq: Once | INTRAVENOUS | Status: AC
  Administered 2015-07-07: 1000 mL via INTRAVENOUS

## 2015-07-07 NOTE — ED Provider Notes (Signed)
CSN: [id number]     Arrival date & time 07/07/15  0912 History   First MD Initiated Contact with Patient 07/07/15 [phone]     Chief Complaint  Patient presents with  . Shortness of Breath      HPI Patient presents to the emergency department. He states he has a history of asthma-like reactions when he has upper respiratory tract infections. He reports worsening shortness of breath over the past 24 hours. Reports productive cough. Reports chills without documented fever. Denies nausea vomiting diarrhea. He was given a beer on Solu-Medrol without reported by EMS. He feels somewhat better at this time. Symptoms are moderate in severity when he called EMS.  Past Medical History  Diagnosis Date  . Asthma    History reviewed. No pertinent past surgical history. No family history on file. Social History  Substance Use Topics  . Smoking status: Current Every Day Smoker  . Smokeless tobacco: None  . Alcohol Use: Yes     Comment: occ    Review of Systems  All other systems reviewed and are negative.     Allergies  Review of patient's allergies indicates no known allergies.  Home Medications   Prior to Admission medications   Medication Sig Start Date End Date Taking? Authorizing Provider  GINSENG PO Take 1 tablet by mouth daily.   Yes Historical Provider, MD  ibuprofen (ADVIL,MOTRIN) 800 MG tablet Take 1 tablet (800 mg total) by mouth 3 (three) times daily. Patient taking differently: Take 800 mg by mouth daily as needed for moderate pain.  09/29/14  Yes Shari Upstill, PA-C  albuterol (PROVENTIL HFA;VENTOLIN HFA) 108 (90 BASE) MCG/ACT inhaler Inhale 2 puffs into the lungs every 4 (four) hours as needed for wheezing or shortness of breath. Patient not taking: Reported on 01/16/2015 09/13/14   Gilda Crease, MD  Fluticasone-Salmeterol (ADVAIR DISKUS) 250-50 MCG/DOSE AEPB Inhale 1 puff into the lungs 2 (two) times daily. Patient not taking: Reported on 01/16/2015 09/14/14    Gilda Crease, MD   BP 115/75 mmHg  Pulse 106  Temp(Src) 98.3 F (36.8 C) (Oral)  Resp 18  SpO2 98% Physical Exam  Constitutional: He is oriented to person, place, and time. He appears well-developed and well-nourished.  HENT:  Head: Normocephalic and atraumatic.  Eyes: EOM are normal.  Neck: Normal range of motion.  Cardiovascular: Regular rhythm, normal heart sounds and intact distal pulses.   Tachycardia  Pulmonary/Chest: Effort normal. No respiratory distress. He has wheezes.  Abdominal: Soft. He exhibits no distension. There is no tenderness.  Musculoskeletal: Normal range of motion.  Neurological: He is alert and oriented to person, place, and time.  Skin: Skin is warm and dry.  Psychiatric: He has a normal mood and affect. Judgment normal.  Nursing note and vitals reviewed.   ED Course  Procedures (including critical care time) Labs Review Labs Reviewed - No data to display  Imaging Review Dg Chest 2 View  07/07/2015  CLINICAL DATA:  Short of breath for wheezing since yesterday EXAM: CHEST  2 VIEW COMPARISON:  01/16/2015 FINDINGS: Normal heart size. Lungs clear. No pneumothorax. No pleural effusion. IMPRESSION: No active cardiopulmonary disease. Electronically Signed   By: Jolaine Click M.D.   On: 07/07/2015 10:17   I have personally reviewed and evaluated these images and lab results as part of my medical decision-making.   EKG Interpretation None      MDM   Final diagnoses:  None    Patient feels much better at  this time. Improved with bronchodilators. Home with prednisone and albuterol inhaler. He understands to return to the ER for new or worse symptoms.    Azalia Bilis, MD 07/07/15 [phone]

## 2015-07-07 NOTE — ED Notes (Signed)
Per EMS, pt from home, reports resp distress, with audible wheezing and diminished in all fields.  Pt has hx of asthma.  Started yesterday evening.  Solumedrol 125mg  IVP, 10mg  albuterol and 0.5mg  atrovent.  Pt is A&Ox 4.

## 2015-07-07 NOTE — Discharge Instructions (Signed)
Bronchospasm, Adult  A bronchospasm is a spasm or tightening of the airways going into the lungs. During a bronchospasm breathing becomes more difficult because the airways get smaller. When this happens there can be coughing, a whistling sound when breathing (wheezing), and difficulty breathing. Bronchospasm is often associated with asthma, but not all patients who experience a bronchospasm have asthma.  CAUSES   A bronchospasm is caused by inflammation or irritation of the airways. The inflammation or irritation may be triggered by:   · Allergies (such as to animals, pollen, food, or mold). Allergens that cause bronchospasm may cause wheezing immediately after exposure or many hours later.    · Infection. Viral infections are believed to be the most common cause of bronchospasm.    · Exercise.    · Irritants (such as pollution, cigarette smoke, strong odors, aerosol sprays, and paint fumes).    · Weather changes. Winds increase molds and pollens in the air. Rain refreshes the air by washing irritants out. Cold air may cause inflammation.    · Stress and emotional upset.    SIGNS AND SYMPTOMS   · Wheezing.    · Excessive nighttime coughing.    · Frequent or severe coughing with a simple cold.    · Chest tightness.    · Shortness of breath.    DIAGNOSIS   Bronchospasm is usually diagnosed through a history and physical exam. Tests, such as chest X-rays, are sometimes done to look for other conditions.  TREATMENT   · Inhaled medicines can be given to open up your airways and help you breathe. The medicines can be given using either an inhaler or a nebulizer machine.  · Corticosteroid medicines may be given for severe bronchospasm, usually when it is associated with asthma.  HOME CARE INSTRUCTIONS   · Always have a plan prepared for seeking medical care. Know when to call your health care provider and local emergency services (911 in the [place] Know where you can access local emergency care.  · Only take medicines as  directed by your health care provider.  · If you were prescribed an inhaler or nebulizer machine, ask your health care provider to explain how to use it correctly. Always use a spacer with your inhaler if you were given one.  · It is necessary to remain calm during an attack. Try to relax and breathe more slowly.   · Control your home environment in the following ways:      Change your heating and air conditioning filter at least once a month.      Limit your use of fireplaces and wood stoves.    Do not smoke and do not allow smoking in your home.      Avoid exposure to perfumes and fragrances.      Get rid of pests (such as roaches and mice) and their droppings.      Throw away plants if you see mold on them.      Keep your house clean and dust free.      Replace carpet with wood, tile, or vinyl flooring. Carpet can trap dander and dust.      Use allergy-proof pillows, mattress covers, and box spring covers.      Wash bed sheets and blankets every week in hot water and dry them in a dryer.      Use blankets that are made of polyester or cotton.      Wash hands frequently.  SEEK MEDICAL CARE IF:   · You have muscle aches.    · You have chest pain.    · The sputum changes from clear or   white to yellow, green, gray, or bloody.    · The sputum you cough up gets thicker.    · There are problems that may be related to the medicine you are given, such as a rash, itching, swelling, or trouble breathing.    SEEK IMMEDIATE MEDICAL CARE IF:   · You have worsening wheezing and coughing even after taking your prescribed medicines.    · You have increased difficulty breathing.    · You develop severe chest pain.  MAKE SURE YOU:   · Understand these instructions.  · Will watch your condition.  · Will get help right away if you are not doing well or get worse.     This information is not intended to replace advice given to you by your health care provider. Make sure you discuss any questions you have with your health care  provider.     Document Released: 04/05/2003 Document Revised: 04/23/2014 Document Reviewed: 09/22/2012  Elsevier Interactive Patient Education ©2016 [employer]

## 2015-07-07 NOTE — ED Notes (Signed)
Pt reports he feels much better.  Reports hx of asthma but does not have inhaler at home.

## 2015-07-07 NOTE — Progress Notes (Signed)
Entered in d/c instructions  pcp is [facility] [address] [place], [place] [address] [phone]

## 2015-07-07 NOTE — ED Notes (Signed)
Bed: HE03 Expected date:  Expected time:  Means of arrival:  Comments: 61M/resp. distress

## 2015-07-08 ENCOUNTER — Telehealth: Payer: Self-pay | Admitting: *Deleted

## 2015-07-11 ENCOUNTER — Telehealth (HOSPITAL_BASED_OUTPATIENT_CLINIC_OR_DEPARTMENT_OTHER): Payer: Self-pay | Admitting: Emergency Medicine

## 2015-07-11 NOTE — Telephone Encounter (Deleted)
Post ED Visit - Positive Culture Follow-up  Culture report reviewed by antimicrobial stewardship pharmacist:  []  Enzo Bi, Pharm.D. []  Celedonio Miyamoto, Pharm.D., BCPS []  Garvin Fila, Pharm.D. []  Georgina Pillion, Pharm.D., BCPS []  [place], [place].D., BCPS, AAHIVP []  Estella Husk, Pharm.D., BCPS, AAHIVP []  Tennis Must, Pharm.D. []  Rob [place], [address].D.  Positive *** culture Treated with ***, organism sensitive to the same and no further patient follow-up is required at this time.  Berle Mull 07/11/2015, 2:09 PM

## 2015-07-11 NOTE — Telephone Encounter (Signed)
Patient calling d/t "losing RX", was seen 4 days ago in the ED, rx for deltasone #30 called to [facility] per pt request, directions are take Deltasone 60 mg daily x 5 days, when speaking with patient noted having difficulty speaking in complete sentences d/ t perceived dyspnea, encouraged pt to return to ED if dyspnea continues

## 2015-08-07 ENCOUNTER — Encounter (HOSPITAL_COMMUNITY): Payer: Self-pay | Admitting: Emergency Medicine

## 2015-08-07 ENCOUNTER — Emergency Department (HOSPITAL_COMMUNITY)
Admission: EM | Admit: 2015-08-07 | Discharge: 2015-08-08 | Disposition: A | Discharge status: Home / Self Care | Payer: Medicaid Other | Attending: Emergency Medicine | Admitting: Emergency Medicine

## 2015-08-07 DIAGNOSIS — M79671 Pain in right foot: Secondary | ICD-10-CM | POA: Diagnosis present

## 2015-08-07 DIAGNOSIS — L988 Other specified disorders of the skin and subcutaneous tissue: Secondary | ICD-10-CM | POA: Insufficient documentation

## 2015-08-07 DIAGNOSIS — S90821A Blister (nonthermal), right foot, initial encounter: Secondary | ICD-10-CM

## 2015-08-07 DIAGNOSIS — Z79899 Other long term (current) drug therapy: Secondary | ICD-10-CM | POA: Insufficient documentation

## 2015-08-07 DIAGNOSIS — J45901 Unspecified asthma with (acute) exacerbation: Secondary | ICD-10-CM | POA: Diagnosis not present

## 2015-08-07 DIAGNOSIS — F17200 Nicotine dependence, unspecified, uncomplicated: Secondary | ICD-10-CM | POA: Insufficient documentation

## 2015-08-07 NOTE — ED Notes (Signed)
Pt. reports pain at right foot with swelling onset today after he peeled the skin of a blister , pain radiating to lower leg .

## 2015-08-08 MED ORDER — ALBUTEROL SULFATE HFA 108 (90 BASE) MCG/ACT IN AERS
1.0000 | INHALATION_SPRAY | Freq: Once | RESPIRATORY_TRACT | Status: AC
  Administered 2015-08-08: 2 via RESPIRATORY_TRACT
  Filled 2015-08-08: qty 6.7

## 2015-08-08 NOTE — ED Provider Notes (Signed)
CSN: [id number]     Arrival date & time 08/07/15  2347 History   First MD Initiated Contact with Patient 08/07/15 2356     Chief Complaint  Patient presents with  . Foot Pain   (Consider location/radiation/quality/duration/timing/severity/associated sxs/prior Treatment) HPI 36 y.o. male presents to the Emergency Department today complaining of pain on bottom of his right foot. Notes pain occurred this morning when he removed a blister from the bottom of his right foot. Notes pain with ambulation. States he has never had a blister before. Pain is 10/10 and pulsating. Worse with ambulation. Pain relieved with rest. Has not tried anything OTC. No fevers. No N/V. Pt able to ambulate. No numbness/tingling. No other symptoms noted.    Past Medical History  Diagnosis Date  . Asthma    History reviewed. No pertinent past surgical history. No family history on file. Social History  Substance Use Topics  . Smoking status: Current Every Day Smoker  . Smokeless tobacco: None  . Alcohol Use: Yes     Comment: occ    Review of Systems ROS reviewed and all are negative for acute change except as noted in the HPI.  Allergies  Review of patient's allergies indicates no known allergies.  Home Medications   Prior to Admission medications   Medication Sig Start Date End Date Taking? Authorizing Provider  GINSENG PO Take 1 tablet by mouth daily.    Historical Provider, MD  predniSONE (DELTASONE) 10 MG tablet Take 6 tablets (60 mg total) by mouth daily. 07/07/15   Azalia Bilis, MD   BP 123/83 mmHg  Pulse 98  Temp(Src) 98.5 F (36.9 C) (Oral)  Resp 16  SpO2 98%   Physical Exam  Constitutional: He is oriented to person, place, and time. He appears well-developed and well-nourished.  HENT:  Head: Normocephalic and atraumatic.  Eyes: EOM are normal.  Neck: Normal range of motion.  Cardiovascular: Normal rate and regular rhythm.   Pulmonary/Chest: Effort normal. He has wheezes in the right  upper field and the left upper field.  Abdominal: Soft.  Musculoskeletal: Normal range of motion.  1cm diameter blister noted on plantar aspect of right foot. No erythema. No signs of infection. No bleeding. No other symptoms noted.   Neurological: He is alert and oriented to person, place, and time.  Skin: Skin is warm and dry.  Psychiatric: He has a normal mood and affect. His behavior is normal. Thought content normal.  Nursing note and vitals reviewed.  ED Course  Procedures (including critical care time) Labs Review Labs Reviewed - No data to display  Imaging Review No results found. I have personally reviewed and evaluated these images and lab results as part of my medical decision-making.   EKG Interpretation None      MDM  I have reviewed the relevant previous healthcare records. I obtained HPI from historian.  ED Course:  Assessment: Pt is a 34yM who presents with blister on bottom of right foot. Counseled on mole skin for added protection. Use of neosporin and to keep area covered for pain relief.  On exam, pt in NAD. Nontoxic/nonseptic appearing. VSS. Afebrile. Lungs with wheezes bilaterally due to chronic bronchitis. Will refill albuterol and patient's request. Heart RRR. Plan is to DC Home with follow up to PCP. At time of discharge, Patient is in no acute distress. Vital Signs are stable. Patient is able to ambulate. Patient able to tolerate PO.    Disposition/Plan:  DC Home Additional Verbal discharge instructions given  and discussed with patient.  Pt Instructed to f/u with PCP in the next week for evaluation and treatment of symptoms. Return precautions given Pt acknowledges and agrees with plan  Supervising Physician Laurence Spates, MD   Final diagnoses:  Blister of foot, right, initial encounter     Audry Pili, PA-C 08/08/15 0025  Laurence Spates, MD 08/08/15 [phone]

## 2015-08-08 NOTE — Discharge Instructions (Signed)
Please read and follow all provided instructions.  Your diagnoses today include:  1. Blister of foot, right, initial encounter    Tests performed today include:  Vital signs. See below for your results today.   Medications prescribed:   None  Home care instructions:  Follow any educational materials contained in this packet.  Follow-up instructions: Please follow-up with your primary care provider in the next week for further evaluation of symptoms and treatment   Return instructions:   Please return to the Emergency Department if you do not get better, if you get worse, or new symptoms OR  - Fever (temperature greater than 101.13F)  - Bleeding that does not stop with holding pressure to the area    -Severe pain (please note that you may be more sore the day after your accident)  - Chest Pain  - Difficulty breathing  - Severe nausea or vomiting  - Inability to tolerate food and liquids  - Passing out  - Skin becoming red around your wounds  - Change in mental status (confusion or lethargy)  - New numbness or weakness     Please return if you have any other emergent concerns.  Additional Information:  Your vital signs today were: BP 123/83 mmHg   Pulse 98   Temp(Src) 98.5 F (36.9 C) (Oral)   Resp 16   SpO2 98% If your blood pressure (BP) was elevated above 135/85 this visit, please have this repeated by your doctor within one month. ---------------

## 2015-08-16 ENCOUNTER — Emergency Department (HOSPITAL_COMMUNITY): Payer: Medicaid Other

## 2015-08-16 ENCOUNTER — Emergency Department (HOSPITAL_COMMUNITY)
Admission: EM | Admit: 2015-08-16 | Discharge: 2015-08-16 | Disposition: A | Discharge status: Home / Self Care | Payer: Medicaid Other | Attending: Emergency Medicine | Admitting: Emergency Medicine

## 2015-08-16 ENCOUNTER — Encounter (HOSPITAL_COMMUNITY): Payer: Self-pay | Admitting: Emergency Medicine

## 2015-08-16 DIAGNOSIS — F17200 Nicotine dependence, unspecified, uncomplicated: Secondary | ICD-10-CM | POA: Insufficient documentation

## 2015-08-16 DIAGNOSIS — R0602 Shortness of breath: Secondary | ICD-10-CM | POA: Diagnosis present

## 2015-08-16 DIAGNOSIS — J45901 Unspecified asthma with (acute) exacerbation: Secondary | ICD-10-CM | POA: Diagnosis not present

## 2015-08-16 MED ORDER — PREDNISONE 10 MG PO TABS: 20.0000 mg | ORAL_TABLET | Freq: Two times a day (BID) | ORAL | Status: DC

## 2015-08-16 MED ORDER — FLUTICASONE-SALMETEROL 250-50 MCG/DOSE IN AEPB: 1.0000 | INHALATION_SPRAY | Freq: Every day | RESPIRATORY_TRACT | Status: DC

## 2015-08-16 NOTE — Discharge Instructions (Signed)
Prednisone and Advair as prescribed.  Return to the ER if symptoms significantly worsen or change.   Asthma, Adult Asthma is a recurring condition in which the airways tighten and narrow. Asthma can make it difficult to breathe. It can cause coughing, wheezing, and shortness of breath. Asthma episodes, also called asthma attacks, range from minor to life-threatening. Asthma cannot be cured, but medicines and lifestyle changes can help control it. CAUSES Asthma is believed to be caused by inherited (genetic) and environmental factors, but its exact cause is unknown. Asthma may be triggered by allergens, lung infections, or irritants in the air. Asthma triggers are different for each person. Common triggers include:   Animal dander.  Dust mites.  Cockroaches.  Pollen from trees or grass.  Mold.  Smoke.  Air pollutants such as dust, household cleaners, hair sprays, aerosol sprays, paint fumes, strong chemicals, or strong odors.  Cold air, weather changes, and winds (which increase molds and pollens in the air).  Strong emotional expressions such as crying or laughing hard.  Stress.  Certain medicines (such as aspirin) or types of drugs (such as beta-blockers).  Sulfites in foods and drinks. Foods and drinks that may contain sulfites include dried fruit, potato chips, and sparkling grape juice.  Infections or inflammatory conditions such as the flu, a cold, or an inflammation of the nasal membranes (rhinitis).  Gastroesophageal reflux disease (GERD).  Exercise or strenuous activity. SYMPTOMS Symptoms may occur immediately after asthma is triggered or many hours later. Symptoms include:  Wheezing.  Excessive nighttime or early morning coughing.  Frequent or severe coughing with a common cold.  Chest tightness.  Shortness of breath. DIAGNOSIS  The diagnosis of asthma is made by a review of your medical history and a physical exam. Tests may also be performed. These may  include:  Lung function studies. These tests show how much air you breathe in and out.  Allergy tests.  Imaging tests such as X-rays. TREATMENT  Asthma cannot be cured, but it can usually be controlled. Treatment involves identifying and avoiding your asthma triggers. It also involves medicines. There are 2 classes of medicine used for asthma treatment:   Controller medicines. These prevent asthma symptoms from occurring. They are usually taken every day.  Reliever or rescue medicines. These quickly relieve asthma symptoms. They are used as needed and provide short-term relief. Your health care provider will help you create an asthma action plan. An asthma action plan is a written plan for managing and treating your asthma attacks. It includes a list of your asthma triggers and how they may be avoided. It also includes information on when medicines should be taken and when their dosage should be changed. An action plan may also involve the use of a device called a peak flow meter. A peak flow meter measures how well the lungs are working. It helps you monitor your condition. HOME CARE INSTRUCTIONS   Take medicines only as directed by your health care provider. Speak with your health care provider if you have questions about how or when to take the medicines.  Use a peak flow meter as directed by your health care provider. Record and keep track of readings.  Understand and use the action plan to help minimize or stop an asthma attack without needing to seek medical care.  Control your home environment in the following ways to help prevent asthma attacks:  Do not smoke. Avoid being exposed to secondhand smoke.  Change your heating and air conditioning filter  regularly.  Limit your use of fireplaces and wood stoves.  Get rid of pests (such as roaches and mice) and their droppings.  Throw away plants if you see mold on them.  Clean your floors and dust regularly. Use unscented cleaning  products.  Try to have someone else vacuum for you regularly. Stay out of rooms while they are being vacuumed and for a short while afterward. If you vacuum, use a dust mask from a hardware store, a double-layered or microfilter vacuum cleaner bag, or a vacuum cleaner with a HEPA filter.  Replace carpet with wood, tile, or vinyl flooring. Carpet can trap dander and dust.  Use allergy-proof pillows, mattress covers, and box spring covers.  Wash bed sheets and blankets every week in hot water and dry them in a dryer.  Use blankets that are made of polyester or cotton.  Clean bathrooms and kitchens with bleach. If possible, have someone repaint the walls in these rooms with mold-resistant paint. Keep out of the rooms that are being cleaned and painted.  Wash hands frequently. SEEK MEDICAL CARE IF:   You have wheezing, shortness of breath, or a cough even if taking medicine to prevent attacks.  The colored mucus you cough up (sputum) is thicker than usual.  Your sputum changes from clear or white to yellow, green, gray, or bloody.  You have any problems that may be related to the medicines you are taking (such as a rash, itching, swelling, or trouble breathing).  You are using a reliever medicine more than 2-3 times per week.  Your peak flow is still at 50-79% of your personal best after following your action plan for 1 hour.  You have a fever. SEEK IMMEDIATE MEDICAL CARE IF:   You seem to be getting worse and are unresponsive to treatment during an asthma attack.  You are short of breath even at rest.  You get short of breath when doing very little physical activity.  You have difficulty eating, drinking, or talking due to asthma symptoms.  You develop chest pain.  You develop a fast heartbeat.  You have a bluish color to your lips or fingernails.  You are light-headed, dizzy, or faint.  Your peak flow is less than 50% of your personal best.   This information is not  intended to replace advice given to you by your health care provider. Make sure you discuss any questions you have with your health care provider.   Document Released: 04/02/2005 Document Revised: 12/22/2014 Document Reviewed: 10/30/2012 Elsevier Interactive Patient Education [employer]

## 2015-08-16 NOTE — ED Provider Notes (Signed)
CSN: [id number]     Arrival date & time 08/16/15  0007 History  By signing my name below, I, [facility], attest that this documentation has been prepared under the direction and in the presence of Geoffery Lyons, MD. Electronically Signed: Randell Patient, ED Scribe. 08/16/2015. 2:42 AM.   Chief Complaint  Patient presents with  . Shortness of Breath   HPI Comments: HPI Comments: Joseph Huff is a 35 y.o. Male brought in by EMS who presents to the Emergency Department complaining of constant, gradually worsening, moderate SOB onset 1 day ago. He reports cough productive of thick mucus. He has received Solumedrol and two breathing treatments while traveling to the ED tonight. Per pt, he has an hx of asthma but ran out of his Advair inhaler. Denies any other symptoms currently.   Patient is a 35 y.o. male presenting with shortness of breath. The history is provided by the patient. No language interpreter was used.  Shortness of Breath Severity:  Moderate Onset quality:  Gradual Duration:  1 day Timing:  Constant Progression:  Worsening Chronicity:  New Associated symptoms: cough    Past Medical History  Diagnosis Date  . Asthma    History reviewed. No pertinent past surgical history. History reviewed. No pertinent family history. Social History  Substance Use Topics  . Smoking status: Current Every Day Smoker  . Smokeless tobacco: None  . Alcohol Use: Yes     Comment: occ    Review of Systems  Respiratory: Positive for cough and shortness of breath.   All other systems reviewed and are negative.   Allergies  Review of patient's allergies indicates no known allergies.  Home Medications   Prior to Admission medications   Medication Sig Start Date End Date Taking? Authorizing Provider  Fluticasone-Salmeterol (ADVAIR DISKUS IN) Inhale 2 puffs into the lungs daily. Reported on 08/16/2015    Historical Provider, MD  predniSONE (DELTASONE) 10 MG tablet Take 6 tablets (60  mg total) by mouth daily. Patient not taking: Reported on 08/16/2015 07/07/15   Azalia Bilis, MD   BP 112/76 mmHg  Pulse 102  Temp(Src) 97.4 F (36.3 C) (Oral)  Resp 18  SpO2 100% Physical Exam  Constitutional: He is oriented to person, place, and time. He appears well-developed and well-nourished.  HENT:  Head: Normocephalic and atraumatic.  Eyes: EOM are normal.  Neck: Normal range of motion.  Cardiovascular: Normal rate, regular rhythm, normal heart sounds and intact distal pulses.   Pulmonary/Chest: Effort normal. No respiratory distress. He has wheezes.  Slight expiratory wheezing bilaterally.  Abdominal: Soft. He exhibits no distension. There is no tenderness.  Musculoskeletal: Normal range of motion.  Neurological: He is alert and oriented to person, place, and time.  Skin: Skin is warm and dry.  Psychiatric: He has a normal mood and affect. Judgment normal.  Nursing note and vitals reviewed.   ED Course  Procedures   DIAGNOSTIC STUDIES: Oxygen Saturation is 100% on RA, normal by my interpretation.    COORDINATION OF CARE: 1:40 AM Will order chest x-ray. Discussed treatment plan with pt at bedside and pt agreed to plan.  2:42 AM Returned to discuss chest imaging results. Will discharge pt home.  Imaging Review Dg Chest 2 View  08/16/2015  CLINICAL DATA:  Asthma and shortness of breath.  Smoker. EXAM: CHEST  2 VIEW COMPARISON:  07/07/2015 FINDINGS: The heart size and mediastinal contours are within normal limits. Both lungs are clear. The visualized skeletal structures are unremarkable. IMPRESSION: No active  cardiopulmonary disease. Electronically Signed   By: Burman Nieves M.D.   On: 08/16/2015 02:07   I have personally reviewed and evaluated these images as part of my medical decision-making.   MDM   Final diagnoses:  Asthma exacerbation    Patient presents with an apparent exacerbation of his asthma. He was given breathing treatments and is feeling  significantly better. He will be treated with prednisone, continued use of his albuterol, and when necessary return.  I personally performed the services described in this documentation, which was scribed in my presence. The recorded information has been reviewed and is accurate.       Geoffery Lyons, MD 08/17/15 [phone]

## 2015-08-16 NOTE — ED Notes (Signed)
Per EMS pt was seen last week and diagnosed with a URI  Was given an inhaler  Pt has since become more short of breath   EMS started an IV gave solumedrol 125mg  and pt was given 2 breathing treatments on the way here  Pt continues to have wheezing in all fields

## 2015-09-18 ENCOUNTER — Emergency Department (HOSPITAL_COMMUNITY)
Admission: EM | Admit: 2015-09-18 | Discharge: 2015-09-18 | Disposition: A | Discharge status: Home / Self Care | Payer: Medicaid Other | Attending: Emergency Medicine | Admitting: Emergency Medicine

## 2015-09-18 ENCOUNTER — Encounter (HOSPITAL_COMMUNITY): Payer: Self-pay

## 2015-09-18 DIAGNOSIS — F17200 Nicotine dependence, unspecified, uncomplicated: Secondary | ICD-10-CM | POA: Diagnosis not present

## 2015-09-18 DIAGNOSIS — M549 Dorsalgia, unspecified: Secondary | ICD-10-CM | POA: Diagnosis not present

## 2015-09-18 DIAGNOSIS — Z7951 Long term (current) use of inhaled steroids: Secondary | ICD-10-CM | POA: Diagnosis not present

## 2015-09-18 DIAGNOSIS — J45901 Unspecified asthma with (acute) exacerbation: Secondary | ICD-10-CM

## 2015-09-18 DIAGNOSIS — J45909 Unspecified asthma, uncomplicated: Secondary | ICD-10-CM | POA: Diagnosis present

## 2015-09-18 DIAGNOSIS — R079 Chest pain, unspecified: Secondary | ICD-10-CM | POA: Diagnosis not present

## 2015-09-18 DIAGNOSIS — R062 Wheezing: Secondary | ICD-10-CM

## 2015-09-18 DIAGNOSIS — Z7952 Long term (current) use of systemic steroids: Secondary | ICD-10-CM | POA: Diagnosis not present

## 2015-09-18 MED ORDER — PREDNISONE 20 MG PO TABS: 60.0000 mg | ORAL_TABLET | Freq: Two times a day (BID) | ORAL | Status: DC

## 2015-09-18 MED ORDER — IBUPROFEN 200 MG PO TABS
600.0000 mg | ORAL_TABLET | Freq: Once | ORAL | Status: AC
Start: 2015-09-18 — End: 2015-09-18
  Administered 2015-09-18: 600 mg via ORAL
  Filled 2015-09-18: qty 3

## 2015-09-18 MED ORDER — ALBUTEROL (5 MG/ML) CONTINUOUS INHALATION SOLN
10.0000 mg/h | INHALATION_SOLUTION | Freq: Once | RESPIRATORY_TRACT | Status: AC
  Administered 2015-09-18: 10 mg/h via RESPIRATORY_TRACT
  Filled 2015-09-18: qty 20

## 2015-09-18 MED ORDER — AEROCHAMBER PLUS FLO-VU MEDIUM MISC
1.0000 | Freq: Once | Status: AC
  Administered 2015-09-18: 1

## 2015-09-18 MED ORDER — FLUTICASONE-SALMETEROL 250-50 MCG/DOSE IN AEPB: 1.0000 | INHALATION_SPRAY | Freq: Every day | RESPIRATORY_TRACT | Status: DC

## 2015-09-18 MED ORDER — ALBUTEROL SULFATE HFA 108 (90 BASE) MCG/ACT IN AERS
1.0000 | INHALATION_SPRAY | Freq: Once | RESPIRATORY_TRACT | Status: AC
  Administered 2015-09-18: 1 via RESPIRATORY_TRACT
  Filled 2015-09-18: qty 6.7

## 2015-09-18 NOTE — ED Notes (Signed)
Pt brought in EMS for [facility] that began at 1100 this morning.  EMS reports wheezing in all fields upon their arrival.  Pt given 10mg  Albuterol, 0.5mg  Atrovent and 125mg  Solu-medrol PTA with improvement in wheezing.

## 2015-09-18 NOTE — ED Notes (Signed)
Ambulated patient in hallway with pulse ox. Pulse ox 92% on room air during ambulation.

## 2015-09-18 NOTE — Discharge Instructions (Signed)
Take your Advair and prednisone as prescribed. Usually your albuterol inhaler as needed. Obtain a primary care provider and be seen within one week regarding your visit to the emergency department today.  Return to emergency department if you experience worsening shortness of breath, wheezing, chest pain.  Asthma Attack Prevention While you may not be able to control the fact that you have asthma, you can take actions to prevent asthma attacks. The best way to prevent asthma attacks is to maintain good control of your asthma. You can achieve this by:  Taking your medicines as directed.  Avoiding things that can irritate your airways or make your asthma symptoms worse (asthma triggers).  Keeping track of how well your asthma is controlled and of any changes in your symptoms.  Responding quickly to worsening asthma symptoms (asthma attack).  Seeking emergency care when it is needed. WHAT ARE SOME WAYS TO PREVENT AN ASTHMA ATTACK? Have a Plan Work with your health care provider to create a written plan for managing and treating your asthma attacks (asthma action plan). This plan includes:  A list of your asthma triggers and how you can avoid them.  Information on when medicines should be taken and when their dosages should be changed.  The use of a device that measures how well your lungs are working (peak flow meter). Monitor Your Asthma Use your peak flow meter and record your results in a journal every day. A drop in your peak flow numbers on one or more days may indicate the start of an asthma attack. This can happen even before you start to feel symptoms. You can prevent an asthma attack from getting worse by following the steps in your asthma action plan. Avoid Asthma Triggers Work with your asthma health care provider to find out what your asthma triggers are. This can be done by:  Allergy testing.  Keeping a journal that notes when asthma attacks occur and the factors that may  have contributed to them.  Determining if there are other medical conditions that are making your asthma worse. Once you have determined your asthma triggers, take steps to avoid them. This may include avoiding excessive or prolonged exposure to:  Dust. Have someone dust and vacuum your home for you once or twice a week. Using a high-efficiency particulate arrestance (HEPA) vacuum is best.  Smoke. This includes campfire smoke, forest fire smoke, and secondhand smoke from tobacco products.  Pet dander. Avoid contact with animals that you know you are allergic to.  Allergens from trees, grasses or pollens. Avoid spending a lot of time outdoors when pollen counts are high, and on very windy days.  Very cold, dry, or humid air.  Mold.  Foods that contain high amounts of sulfites.  Strong odors.  Outdoor air pollutants, such as Museum/gallery exhibitions officer.  Indoor air pollutants, such as aerosol sprays and fumes from household cleaners.  Household pests, including dust mites and cockroaches, and pest droppings.  Certain medicines, including NSAIDs. Always talk to your health care provider before stopping or starting any new medicines. Medicines Take over-the-counter and prescription medicines only as told by your health care provider. Many asthma attacks can be prevented by carefully following your medicine schedule. Taking your medicines correctly is especially important when you cannot avoid certain asthma triggers. Act Quickly If an asthma attack does happen, acting quickly can decrease how severe it is and how long it lasts. Take these steps:   Pay attention to your symptoms. If you are coughing,  wheezing, or having difficulty breathing, do not wait to see if your symptoms go away on their own. Follow your asthma action plan.  If you have followed your asthma action plan and your symptoms are not improving, call your health care provider or seek immediate medical care at the nearest  hospital. It is important to note how often you need to use your fast-acting rescue inhaler. If you are using your rescue inhaler more often, it may mean that your asthma is not under control. Adjusting your asthma treatment plan may help you to prevent future asthma attacks and help you to gain better control of your condition. HOW CAN I PREVENT AN ASTHMA ATTACK WHEN I EXERCISE? Follow advice from your health care provider about whether you should use your fast-acting inhaler before exercising. Many people with asthma experience exercise-induced bronchoconstriction (EIB). This condition often worsens during vigorous exercise in cold, humid, or dry environments. Usually, people with EIB can stay very active by pre-treating with a fast-acting inhaler before exercising.   This information is not intended to replace advice given to you by your health care provider. Make sure you discuss any questions you have with your health care provider.   Document Released: 03/21/2009 Document Revised: 12/22/2014 Document Reviewed: 09/02/2014 Elsevier Interactive Patient Education [employer]

## 2015-09-18 NOTE — ED Provider Notes (Signed)
CSN: [id number]     Arrival date & time 09/18/15  1848 History   First MD Initiated Contact with Patient 09/18/15 1858     Chief Complaint  Patient presents with  . Asthma     (Consider location/radiation/quality/duration/timing/severity/associated sxs/prior Treatment) HPI   Patient is a 35 year old male with a history of asthma who presents to the ED via EMS with acute asthma exacerbation started today around 11 AM. Patient stated he began experiencing some wheezing and coughing around 11 AM which progressively got worse. Patient states he was unable to speak due to his respiratory distress when EMS arrived. He states associated sweating, chest pain, back pain, cough, wheezing. Patient states the chest and back pain is worse with coughing and deep inspiration. Patient states he has never been intubated for his asthma. He states he was hospitalized when he was 12 for his asthma. Patient received one DuoNeb, 1 albuterol neb, and 125 mg of Solu-Medrol via EMS. Patient states he did not use his home medications for 2 months. He currently does not have a PCP. Patient denies nausea, vomiting, visual changes, fever, chills. Patient states he was outside walking today when this asthma attack occurred. He denies recent illness. He believes it may be related to the heat.  Past Medical History  Diagnosis Date  . Asthma    History reviewed. No pertinent past surgical history. History reviewed. No pertinent family history. Social History  Substance Use Topics  . Smoking status: Current Every Day Smoker  . Smokeless tobacco: None  . Alcohol Use: Yes     Comment: occ    Review of Systems  Constitutional: Negative for fever and chills.  Respiratory: Positive for cough, chest tightness, shortness of breath and wheezing.   Cardiovascular: Positive for chest pain.  Gastrointestinal: Negative for nausea and vomiting.  Musculoskeletal: Positive for back pain.  Neurological: Negative for dizziness,  syncope and headaches.      Allergies  Review of patient's allergies indicates no known allergies.  Home Medications   Prior to Admission medications   Medication Sig Start Date End Date Taking? Authorizing Provider  Fluticasone-Salmeterol (ADVAIR DISKUS) 250-50 MCG/DOSE AEPB Inhale 1 puff into the lungs daily. 09/18/15   Jerre Simon, PA  predniSONE (DELTASONE) 20 MG tablet Take 3 tablets (60 mg total) by mouth 2 (two) times daily. 09/18/15   Criselda Starke L Verenice Westrich, PA   BP 112/75 mmHg  Pulse 83  Temp(Src) 97.8 F (36.6 C) (Oral)  Resp 20  SpO2 99% Physical Exam  Constitutional: He appears well-developed and well-nourished. No distress.  Patient is able to speak in full sentences  HENT:  Head: Normocephalic and atraumatic.  Eyes: Conjunctivae are normal.  Neck: Normal range of motion. Neck supple.  Cardiovascular: Normal rate, regular rhythm and normal heart sounds.  Exam reveals no gallop and no friction rub.   No murmur heard. Pulmonary/Chest: Effort normal. No accessory muscle usage. No tachypnea. No respiratory distress. He has wheezes.  Inspiratory and sure he wheezes heard throughout all lung fields.  Musculoskeletal: Normal range of motion. He exhibits no edema.  Neurological: He is alert. Coordination normal.  Skin: Skin is warm and dry. He is not diaphoretic.  Psychiatric: He has a normal mood and affect. His behavior is normal.  Nursing note and vitals reviewed.   ED Course  Procedures (including critical care time)  2015: pt Wheezing slightly improved although wheezing is still heard throughout all lung fields. Will continue the continuous neb. Will treat patient's back  pain.  2145: Patient's wheezing greatly improved, he states he is breathing much better. He states his back pain has been alleviated. He has very mild expiratory wheezes in the right lung field. He is stable for discharge. He will be sent home with an albuterol inhaler, a prescription for Advair and a  prescription for prednisone.  Labs Review Labs Reviewed - No data to display  Imaging Review No results found. I have personally reviewed and evaluated these images and lab results as part of my medical decision-making.   EKG Interpretation None      MDM   Final diagnoses:  Asthma attack  Wheezing   Patient ambulated in ED with O2 saturations maintained >90, no current signs of respiratory distress. Lung exam improved after nebulizer treatment. Solu-Medrol given by EMS and pt will bd dc with 5 day burst of prednisone. Patient also discharged with albuterol inhaler with spacer, and Advair. Pt states they are breathing at baseline. Pt has been instructed to continue using prescribed medications and obtain a PCP and discuss with them today's exacerbation. I discussed with the patient the importance of taking his Advair daily and albuterol as needed to prevent asthma attacks. I discussed strict return precautions. Patient was understanding to the discharge instructions.   Jerre Simon, PA 09/18/15 2208  Rolland Porter, MD 09/27/15 [phone]

## 2015-09-18 NOTE — ED Notes (Signed)
Pt departed in NAD.  

## 2015-10-02 ENCOUNTER — Emergency Department (HOSPITAL_COMMUNITY): Payer: Medicaid Other

## 2015-10-02 ENCOUNTER — Other Ambulatory Visit: Payer: Self-pay

## 2015-10-02 ENCOUNTER — Encounter (HOSPITAL_COMMUNITY): Payer: Self-pay

## 2015-10-02 DIAGNOSIS — J4531 Mild persistent asthma with (acute) exacerbation: Secondary | ICD-10-CM | POA: Diagnosis not present

## 2015-10-02 DIAGNOSIS — F17200 Nicotine dependence, unspecified, uncomplicated: Secondary | ICD-10-CM | POA: Insufficient documentation

## 2015-10-02 DIAGNOSIS — R0602 Shortness of breath: Secondary | ICD-10-CM | POA: Diagnosis present

## 2015-10-02 LAB — BASIC METABOLIC PANEL
ANION GAP: 8 (ref 5–15)
BUN: 19 mg/dL (ref 6–20)
CHLORIDE: 108 mmol/L (ref 101–111)
CO2: 23 mmol/L (ref 22–32)
Calcium: 9 mg/dL (ref 8.9–10.3)
Creatinine, Ser: 1.22 mg/dL (ref 0.61–1.24)
GFR calc Af Amer: >60 mL/min (ref >60)
GFR calc non Af Amer: >60 mL/min (ref >60)
GLUCOSE: 89 mg/dL (ref 65–99)
POTASSIUM: 4.3 mmol/L (ref 3.5–5.1)
Sodium: 139 mmol/L (ref 135–145)

## 2015-10-02 LAB — CBC
HEMATOCRIT: 47.1 % (ref 39.0–52.0)
Hemoglobin: 15.8 g/dL (ref 13.0–17.0)
MCH: 28.2 pg (ref 26.0–34.0)
MCHC: 33.5 g/dL (ref 30.0–36.0)
MCV: 84.1 fL (ref 78.0–100.0)
Platelets: 260 10*3/uL (ref 150–400)
RBC: 5.6 MIL/uL (ref 4.22–5.81)
RDW: 13.8 % (ref 11.5–15.5)
WBC: 9.4 10*3/uL (ref 4.0–10.5)

## 2015-10-02 LAB — I-STAT TROPONIN, ED: Troponin i, poc: 0 ng/mL (ref 0.00–0.08)

## 2015-10-02 MED ORDER — ALBUTEROL SULFATE (2.5 MG/3ML) 0.083% IN NEBU
INHALATION_SOLUTION | RESPIRATORY_TRACT | Status: AC
  Filled 2015-10-02: qty 6

## 2015-10-02 MED ORDER — ALBUTEROL SULFATE (2.5 MG/3ML) 0.083% IN NEBU
5.0000 mg | INHALATION_SOLUTION | Freq: Once | RESPIRATORY_TRACT | Status: AC
  Administered 2015-10-02: 5 mg via RESPIRATORY_TRACT

## 2015-10-02 NOTE — ED Notes (Signed)
Pt complaining of SOB. Pt unable to speak in complete sentences. State hx: asthma. Currently without and inhaler.

## 2015-10-03 ENCOUNTER — Emergency Department (HOSPITAL_COMMUNITY)
Admission: EM | Admit: 2015-10-03 | Discharge: 2015-10-03 | Disposition: A | Discharge status: Home / Self Care | Payer: Medicaid Other | Attending: Emergency Medicine | Admitting: Emergency Medicine

## 2015-10-03 DIAGNOSIS — J4531 Mild persistent asthma with (acute) exacerbation: Secondary | ICD-10-CM

## 2015-10-03 MED ORDER — DEXAMETHASONE SODIUM PHOSPHATE 10 MG/ML IJ SOLN
10.0000 mg | Freq: Once | INTRAMUSCULAR | Status: AC
  Administered 2015-10-03: 10 mg via INTRAMUSCULAR
  Filled 2015-10-03: qty 1

## 2015-10-03 MED ORDER — ALBUTEROL SULFATE HFA 108 (90 BASE) MCG/ACT IN AERS
2.0000 | INHALATION_SPRAY | Freq: Once | RESPIRATORY_TRACT | Status: AC
  Administered 2015-10-03: 2 via RESPIRATORY_TRACT
  Filled 2015-10-03: qty 6.7

## 2015-10-03 MED ORDER — PREDNISONE 20 MG PO TABS
80.0000 mg | ORAL_TABLET | Freq: Once | ORAL | Status: AC
  Administered 2015-10-03: 80 mg via ORAL
  Filled 2015-10-03: qty 4

## 2015-10-03 NOTE — Discharge Instructions (Signed)
We saw you in the ER for your asthma related complains. We gave you some breathing treatments in the ER, and seems like your symptoms have improved. Please take albuterol as needed every 4 hours. Please take the medications prescribed. Please refrain from smoking or smoke exposure. Please see a primary care doctor in 1 week. Return to the ER if your symptoms worsen.   Asthma Attack Prevention While you may not be able to control the fact that you have asthma, you can take actions to prevent asthma attacks. The best way to prevent asthma attacks is to maintain good control of your asthma. You can achieve this by:  Taking your medicines as directed.  Avoiding things that can irritate your airways or make your asthma symptoms worse (asthma triggers).  Keeping track of how well your asthma is controlled and of any changes in your symptoms.  Responding quickly to worsening asthma symptoms (asthma attack).  Seeking emergency care when it is needed. WHAT ARE SOME WAYS TO PREVENT AN ASTHMA ATTACK? Have a Plan Work with your health care provider to create a written plan for managing and treating your asthma attacks (asthma action plan). This plan includes:  A list of your asthma triggers and how you can avoid them.  Information on when medicines should be taken and when their dosages should be changed.  The use of a device that measures how well your lungs are working (peak flow meter). Monitor Your Asthma Use your peak flow meter and record your results in a journal every day. A drop in your peak flow numbers on one or more days may indicate the start of an asthma attack. This can happen even before you start to feel symptoms. You can prevent an asthma attack from getting worse by following the steps in your asthma action plan. Avoid Asthma Triggers Work with your asthma health care provider to find out what your asthma triggers are. This can be done by:  Allergy testing.  Keeping a  journal that notes when asthma attacks occur and the factors that may have contributed to them.  Determining if there are other medical conditions that are making your asthma worse. Once you have determined your asthma triggers, take steps to avoid them. This may include avoiding excessive or prolonged exposure to:  Dust. Have someone dust and vacuum your home for you once or twice a week. Using a high-efficiency particulate arrestance (HEPA) vacuum is best.  Smoke. This includes campfire smoke, forest fire smoke, and secondhand smoke from tobacco products.  Pet dander. Avoid contact with animals that you know you are allergic to.  Allergens from trees, grasses or pollens. Avoid spending a lot of time outdoors when pollen counts are high, and on very windy days.  Very cold, dry, or humid air.  Mold.  Foods that contain high amounts of sulfites.  Strong odors.  Outdoor air pollutants, such as Museum/gallery exhibitions officer.  Indoor air pollutants, such as aerosol sprays and fumes from household cleaners.  Household pests, including dust mites and cockroaches, and pest droppings.  Certain medicines, including NSAIDs. Always talk to your health care provider before stopping or starting any new medicines. Medicines Take over-the-counter and prescription medicines only as told by your health care provider. Many asthma attacks can be prevented by carefully following your medicine schedule. Taking your medicines correctly is especially important when you cannot avoid certain asthma triggers. Act Quickly If an asthma attack does happen, acting quickly can decrease how severe it is and  how long it lasts. Take these steps:   Pay attention to your symptoms. If you are coughing, wheezing, or having difficulty breathing, do not wait to see if your symptoms go away on their own. Follow your asthma action plan.  If you have followed your asthma action plan and your symptoms are not improving, call your health  care provider or seek immediate medical care at the nearest hospital. It is important to note how often you need to use your fast-acting rescue inhaler. If you are using your rescue inhaler more often, it may mean that your asthma is not under control. Adjusting your asthma treatment plan may help you to prevent future asthma attacks and help you to gain better control of your condition. HOW CAN I PREVENT AN ASTHMA ATTACK WHEN I EXERCISE? Follow advice from your health care provider about whether you should use your fast-acting inhaler before exercising. Many people with asthma experience exercise-induced bronchoconstriction (EIB). This condition often worsens during vigorous exercise in cold, humid, or dry environments. Usually, people with EIB can stay very active by pre-treating with a fast-acting inhaler before exercising.   This information is not intended to replace advice given to you by your health care provider. Make sure you discuss any questions you have with your health care provider.   Document Released: 03/21/2009 Document Revised: 12/22/2014 Document Reviewed: 09/02/2014 Elsevier Interactive Patient Education [employer]

## 2015-10-03 NOTE — ED Provider Notes (Signed)
CSN: [id number]     Arrival date & time 10/02/15  2055 History  By signing my name below, I, [facility], attest that this documentation has been prepared under the direction and in the presence of Derwood Kaplan, MD. Electronically Signed: Randell Patient, ED Scribe. 10/03/2015. 1:14 AM.   Chief Complaint  Patient presents with  . Shortness of Breath  . Asthma   The history is provided by the patient. No language interpreter was used.   HPI Comments: Joseph Huff is a 35 y.o. male who presents to the Emergency Department complaining of intermittent, gradually improving SOB onset yesterday. Pt states that was walking this evening when he became fatigued followed by SOB and wheezing. He has had a breathing treatment in the ED with some relief of his symptoms. He takes Advair BID and uses his albuterol inhaler as needed but states that he ran out of advair and his albuterol is in the back seat of his friend's car. He has been seen multiple times for similar complaints, most recently 2 weeks ago where he was discharged with albuterol and prescribed Advair and prednisone. He has been unable to afford to fill his prescription of Advair and states that his albuterol inhaler is in a friend's vehicle. He notes that he does not have a PCP currently. Denies any other symptoms currently.  Past Medical History  Diagnosis Date  . Asthma    History reviewed. No pertinent past surgical history. History reviewed. No pertinent family history. Social History  Substance Use Topics  . Smoking status: Current Every Day Smoker  . Smokeless tobacco: None  . Alcohol Use: Yes     Comment: occ    Review of Systems A complete 10 system review of systems was obtained and all systems are negative except as noted in the HPI and PMH.    Allergies  Review of patient's allergies indicates no known allergies.  Home Medications   Prior to Admission medications   Medication Sig Start Date End Date Taking?  Authorizing Provider  Fluticasone-Salmeterol (ADVAIR DISKUS) 250-50 MCG/DOSE AEPB Inhale 1 puff into the lungs daily. 09/18/15   Jerre Simon, PA  predniSONE (DELTASONE) 20 MG tablet Take 3 tablets (60 mg total) by mouth 2 (two) times daily. 09/18/15   Jessica L Focht, PA   BP 127/82 mmHg  Pulse 111  Temp(Src) 98.6 F (37 C) (Oral)  Resp 22  SpO2 100% Physical Exam  Constitutional: He is oriented to person, place, and time. He appears well-developed and well-nourished. No distress.  HENT:  Head: Normocephalic and atraumatic.  Eyes: Conjunctivae are normal.  Neck: Normal range of motion.  Cardiovascular: Normal rate and regular rhythm.   Pulmonary/Chest: Effort normal. No respiratory distress. He has wheezes.  Diffuse wheezing.  Musculoskeletal: Normal range of motion.  Neurological: He is alert and oriented to person, place, and time.  Skin: Skin is warm and dry.  Psychiatric: He has a normal mood and affect. His behavior is normal.  Nursing note and vitals reviewed.   ED Course  Procedures  DIAGNOSTIC STUDIES: Oxygen Saturation is 100% on RA, normal by my interpretation.    COORDINATION OF CARE: 1:03 AM Discussed results of chest x-ray. Will order prednisone and albuterol. Advised pt to fill prescriptions. Discussed treatment plan with pt at bedside and pt agreed to plan.   Labs Review Labs Reviewed  BASIC METABOLIC PANEL  CBC  I-STAT TROPOININ, ED    Imaging Review Dg Chest 2 View  10/02/2015  CLINICAL  DATA:  35 year old male with shortness of breath. History of asthma. EXAM: CHEST  2 VIEW COMPARISON:  Radiograph dated 08/16/2015 FINDINGS: Two views of the chest demonstrate hyperinflated lungs with flattening of the diaphragms compatible with history of asthma. There is no focal consolidation, pleural effusion, or pneumothorax. The cardiac silhouette is within normal limits. No acute osseous pathology identified. IMPRESSION: No active cardiopulmonary disease.  Electronically Signed   By: Elgie Collard M.D.   On: 10/02/2015 21:26   I have personally reviewed and evaluated these images and lab results as part of my medical decision-making.   EKG Interpretation   Date/Time:  Sunday October 02 2015 21:02:30 EDT Ventricular Rate:  109 PR Interval:  130 QRS Duration: 82 QT Interval:  324 QTC Calculation: 436 R Axis:   86 Text Interpretation:  Sinus tachycardia Right atrial enlargement  Borderline ECG Similar to prior EKG  Confirmed by LIU MD, Annabelle Harman ([id number]) on  10/02/2015 9:10:48 PM      MDM   Final diagnoses:  Acute asthma exacerbation, mild persistent    I personally performed the services described in this documentation, which was scribed in my presence. The recorded information has been reviewed and is accurate.  Pt comes in with asthma attack. He unfortunately left his meds in the back seat of his friends car, and thus had no rescue inhaler. He has an advair rx, but can't afford $3 copay until July 1. Workup from triage is neg. Still has mild wheezing, but he is saturating 100% and i anticipate him improving with prn albuterol puffs at home. Thus, we will give him a rescue inhaler and oral prednisone to take at home. He has been advised that he is getting decadron shot here, so he needs to take 2 of his prednisone pill tomorrow and 2 more the day after. Advised to get pcp.   Derwood Kaplan, MD 10/03/15 0126

## 2015-10-03 NOTE — ED Notes (Signed)
The pt is c/o difficulty breathing today he is a aSTHMATIC AND HAS NIOT HAD AN INHALER FOR SEVERAL DAYS.  NO PAIN NO WHEEZES AT PRESENT  HE WAS GIVEN A HHN IN TRIAGE

## 2015-10-24 ENCOUNTER — Emergency Department (HOSPITAL_COMMUNITY)
Admission: EM | Admit: 2015-10-24 | Discharge: 2015-10-24 | Disposition: A | Discharge status: Home / Self Care | Payer: Medicaid Other | Attending: Emergency Medicine | Admitting: Emergency Medicine

## 2015-10-24 ENCOUNTER — Encounter (HOSPITAL_COMMUNITY): Payer: Self-pay | Admitting: Emergency Medicine

## 2015-10-24 DIAGNOSIS — F17200 Nicotine dependence, unspecified, uncomplicated: Secondary | ICD-10-CM | POA: Insufficient documentation

## 2015-10-24 DIAGNOSIS — J4541 Moderate persistent asthma with (acute) exacerbation: Secondary | ICD-10-CM | POA: Diagnosis not present

## 2015-10-24 DIAGNOSIS — R0602 Shortness of breath: Secondary | ICD-10-CM | POA: Diagnosis present

## 2015-10-24 MED ORDER — ALBUTEROL SULFATE HFA 108 (90 BASE) MCG/ACT IN AERS
2.0000 | INHALATION_SPRAY | Freq: Once | RESPIRATORY_TRACT | Status: AC
  Administered 2015-10-24: 2 via RESPIRATORY_TRACT
  Filled 2015-10-24: qty 6.7

## 2015-10-24 MED ORDER — PREDNISONE 20 MG PO TABS: 60.0000 mg | ORAL_TABLET | Freq: Two times a day (BID) | ORAL | Status: DC

## 2015-10-24 MED ORDER — FLUTICASONE-SALMETEROL 250-50 MCG/DOSE IN AEPB: 1.0000 | INHALATION_SPRAY | Freq: Every day | RESPIRATORY_TRACT | Status: DC

## 2015-10-24 MED ORDER — PREDNISONE 20 MG PO TABS
60.0000 mg | ORAL_TABLET | Freq: Once | ORAL | Status: AC
  Administered 2015-10-24: 60 mg via ORAL
  Filled 2015-10-24: qty 3

## 2015-10-24 NOTE — ED Provider Notes (Signed)
CSN: [id number]     Arrival date & time 10/24/15  0408 History   First MD Initiated Contact with Patient 10/24/15 0421     Chief Complaint  Patient presents with  . Shortness of Breath     (Consider location/radiation/quality/duration/timing/severity/associated sxs/prior Treatment) HPI Comments: 35 year old male presents to the emergency department for evaluation of shortness of breath. He states that he usually takes Advair daily, but has not had this medication in over a month. He states that this shortness of breath worsened this evening to the point where he was not able to walk without getting significantly short of breath. He complains of diffuse chest tightness and wheezing which improved prior to arrival following IV Solu-Medrol and a DuoNeb treatment given by EMS. He was also given a subsequent 5 mg albuterol nebulizer. He had some lightheadedness without syncope. No recent fevers or sick contacts. No nausea or vomiting.  Patient is a 35 y.o. male presenting with shortness of breath.  Shortness of Breath Severity:  Severe Onset quality:  Gradual Duration:  1 day Timing:  Constant Progression:  Worsening Chronicity:  Recurrent Context: known allergens   Relieved by: Solumedrol and DuoNeb given by EMS PTA. Worsened by:  Activity and exertion Ineffective treatments:  Rest Associated symptoms: wheezing   Associated symptoms: no cough, no sputum production and no syncope   Wheezing:    Severity:  Severe   Duration:  1 day   Timing:  Intermittent   Progression:  Worsening   Chronicity:  Recurrent Risk factors: tobacco use   Risk factors: no prolonged immobilization     Past Medical History  Diagnosis Date  . Asthma    History reviewed. No pertinent past surgical history. History reviewed. No pertinent family history. Social History  Substance Use Topics  . Smoking status: Current Every Day Smoker  . Smokeless tobacco: None  . Alcohol Use: Yes     Comment: occ     Review of Systems  Respiratory: Positive for shortness of breath and wheezing. Negative for cough and sputum production.   Cardiovascular: Negative for syncope.  Ten systems reviewed and are negative for acute change, except as noted in the HPI.    Allergies  Review of patient's allergies indicates no known allergies.  Home Medications   Prior to Admission medications   Medication Sig Start Date End Date Taking? Authorizing Provider  Fluticasone-Salmeterol (ADVAIR DISKUS) 250-50 MCG/DOSE AEPB Inhale 1 puff into the lungs daily. 10/24/15   Antony Madura, PA-C  predniSONE (DELTASONE) 20 MG tablet Take 3 tablets (60 mg total) by mouth 2 (two) times daily. 10/24/15   Antony Madura, PA-C   BP 124/87 mmHg  Pulse 74  Temp(Src) 97.4 F (36.3 C) (Oral)  Resp 16  SpO2 98%   Physical Exam  Constitutional: He is oriented to person, place, and time. He appears well-developed and well-nourished. No distress.  Nontoxic appearing, in no distress  HENT:  Head: Normocephalic and atraumatic.  Eyes: Conjunctivae and EOM are normal. No scleral icterus.  Neck: Normal range of motion.  Cardiovascular: Normal rate, regular rhythm and intact distal pulses.   Pulmonary/Chest: Effort normal and breath sounds normal. No respiratory distress. He has no rales.  Chest expansion symmetric. Mild expiratory wheezing in bilateral lower lung fields. Oxygen saturations 98% on room air. No accessory muscle usage.   Musculoskeletal: Normal range of motion.  Neurological: He is alert and oriented to person, place, and time. He exhibits normal muscle tone. Coordination normal.  Skin: Skin is  warm and dry. No rash noted. He is not diaphoretic. No erythema. No pallor.  Psychiatric: His behavior is normal.  Slightly anxious  Nursing note and vitals reviewed.   ED Course  Procedures (including critical care time) Labs Review Labs Reviewed - No data to display  Imaging Review No results found. I have personally  reviewed and evaluated these images and lab results as part of my medical decision-making.   EKG Interpretation None      MDM   Final diagnoses:  Asthma exacerbation attacks, moderate persistent    Patient ambulated in ED with O2 saturations maintained >98% on room air. No current signs of respiratory distress or evidence of rebound/acute symptom worsening. Pt states they are breathing at baseline. Lung exam improved after nebulizer treatment and IV steroids given by EMS. Plan to initiate 5 day burst of prednisone. He has been given a rescue inhaler as well as a new Rx for Advair which has been unable to fill for >1 month. Doubt PNA given lack or rales, fever, or persistent hypoxia while in the ED. Will refer to PCP for outpatient follow up. Return precautions discussed and provided. Patient discharged in satisfactory condition with no unaddressed concerns.   Filed Vitals:   10/24/15 0457 10/24/15 0500 10/24/15 0505 10/24/15 0533  BP:  114/72  124/87  Pulse: 76 74    Temp:      TempSrc:      Resp: 17 16    SpO2: 96% 96% 98%      Antony Madura, PA-C 10/24/15 0554  Dione Booze, MD 10/24/15 [phone]

## 2015-10-24 NOTE — ED Notes (Signed)
Bed: GE95 Expected date:  Expected time:  Means of arrival:  Comments: Joseph Huff

## 2015-10-24 NOTE — ED Notes (Signed)
Brought in by EMS from home with c/o shortness of breath.  Per EMS, pt reported that he has been having progressive shortness of breath.  Pt was using accessory muscles on respirations on EMS' arrival at the scene.  Pt was given Albuterol 5mg  and Atrovent 0.5 mg neb treatment and Solu-Medrol 125 mg IV at the scene.  Pt's symptoms were somewhat relieved after 1st neb treatment but wheezing throughout lungs persisted---- was given another Albuterol 5mg  neb tx en route.  Pt has hx of Asthma.  Pt reports that he is on Medicaid but there are some issues with it and he is not able to have access on his asthma medications.

## 2015-10-24 NOTE — ED Notes (Signed)
Pt ambulated well in the hall without shortness of breath---- O2 sat remained 98% on room air while ambulating.

## 2015-10-24 NOTE — Discharge Instructions (Signed)
Asthma, Adult °Asthma is a recurring condition in which the airways tighten and narrow. Asthma can make it difficult to breathe. It can cause coughing, wheezing, and shortness of breath. Asthma episodes, also called asthma attacks, range from minor to life-threatening. Asthma cannot be cured, but medicines and lifestyle changes can help control it. °CAUSES °Asthma is believed to be caused by inherited (genetic) and environmental factors, but its exact cause is unknown. Asthma may be triggered by allergens, lung infections, or irritants in the air. Asthma triggers are different for each person. Common triggers include:  °· Animal dander. °· Dust mites. °· Cockroaches. °· Pollen from trees or grass. °· Mold. °· Smoke. °· Air pollutants such as dust, household cleaners, hair sprays, aerosol sprays, paint fumes, strong chemicals, or strong odors. °· Cold air, weather changes, and winds (which increase molds and pollens in the air). °· Strong emotional expressions such as crying or laughing hard. °· Stress. °· Certain medicines (such as aspirin) or types of drugs (such as beta-blockers). °· Sulfites in foods and drinks. Foods and drinks that may contain sulfites include dried fruit, potato chips, and sparkling grape juice. °· Infections or inflammatory conditions such as the flu, a cold, or an inflammation of the nasal membranes (rhinitis). °· Gastroesophageal reflux disease (GERD). °· Exercise or strenuous activity. °SYMPTOMS °Symptoms may occur immediately after asthma is triggered or many hours later. Symptoms include: °· Wheezing. °· Excessive nighttime or early morning coughing. °· Frequent or severe coughing with a common cold. °· Chest tightness. °· Shortness of breath. °DIAGNOSIS  °The diagnosis of asthma is made by a review of your medical history and a physical exam. Tests may also be performed. These may include: °· Lung function studies. These tests show how much air you breathe in and out. °· Allergy  tests. °· Imaging tests such as X-rays. °TREATMENT  °Asthma cannot be cured, but it can usually be controlled. Treatment involves identifying and avoiding your asthma triggers. It also involves medicines. There are 2 classes of medicine used for asthma treatment:  °· Controller medicines. These prevent asthma symptoms from occurring. They are usually taken every day. °· Reliever or rescue medicines. These quickly relieve asthma symptoms. They are used as needed and provide short-term relief. °Your health care provider will help you create an asthma action plan. An asthma action plan is a written plan for managing and treating your asthma attacks. It includes a list of your asthma triggers and how they may be avoided. It also includes information on when medicines should be taken and when their dosage should be changed. An action plan may also involve the use of a device called a peak flow meter. A peak flow meter measures how well the lungs are working. It helps you monitor your condition. °HOME CARE INSTRUCTIONS  °· Take medicines only as directed by your health care provider. Speak with your health care provider if you have questions about how or when to take the medicines. °· Use a peak flow meter as directed by your health care provider. Record and keep track of readings. °· Understand and use the action plan to help minimize or stop an asthma attack without needing to seek medical care. °· Control your home environment in the following ways to help prevent asthma attacks: °· Do not smoke. Avoid being exposed to secondhand smoke. °· Change your heating and air conditioning filter regularly. °· Limit your use of fireplaces and wood stoves. °· Get rid of pests (such as roaches   and mice) and their droppings. °· Throw away plants if you see mold on them. °· Clean your floors and dust regularly. Use unscented cleaning products. °· Try to have someone else vacuum for you regularly. Stay out of rooms while they are  being vacuumed and for a short while afterward. If you vacuum, use a dust mask from a hardware store, a double-layered or microfilter vacuum cleaner bag, or a vacuum cleaner with a HEPA filter. °· Replace carpet with wood, tile, or vinyl flooring. Carpet can trap dander and dust. °· Use allergy-proof pillows, mattress covers, and box spring covers. °· Wash bed sheets and blankets every week in hot water and dry them in a dryer. °· Use blankets that are made of polyester or cotton. °· Clean bathrooms and kitchens with bleach. If possible, have someone repaint the walls in these rooms with mold-resistant paint. Keep out of the rooms that are being cleaned and painted. °· Wash hands frequently. °SEEK MEDICAL CARE IF:  °· You have wheezing, shortness of breath, or a cough even if taking medicine to prevent attacks. °· The colored mucus you cough up (sputum) is thicker than usual. °· Your sputum changes from clear or white to yellow, green, gray, or bloody. °· You have any problems that may be related to the medicines you are taking (such as a rash, itching, swelling, or trouble breathing). °· You are using a reliever medicine more than 2-3 times per week. °· Your peak flow is still at 50-79% of your personal best after following your action plan for 1 hour. °· You have a fever. °SEEK IMMEDIATE MEDICAL CARE IF:  °· You seem to be getting worse and are unresponsive to treatment during an asthma attack. °· You are short of breath even at rest. °· You get short of breath when doing very little physical activity. °· You have difficulty eating, drinking, or talking due to asthma symptoms. °· You develop chest pain. °· You develop a fast heartbeat. °· You have a bluish color to your lips or fingernails. °· You are light-headed, dizzy, or faint. °· Your peak flow is less than 50% of your personal best. °  °This information is not intended to replace advice given to you by your health care provider. Make sure you discuss any  questions you have with your health care provider. °  °Document Released: 04/02/2005 Document Revised: 12/22/2014 Document Reviewed: 10/30/2012 °Elsevier Interactive Patient Education ©2016 [employer] ° °Asthma Attack Prevention °While you may not be able to control the fact that you have asthma, you can take actions to prevent asthma attacks. The best way to prevent asthma attacks is to maintain good control of your asthma. You can achieve this by: °· Taking your medicines as directed. °· Avoiding things that can irritate your airways or make your asthma symptoms worse (asthma triggers). °· Keeping track of how well your asthma is controlled and of any changes in your symptoms. °· Responding quickly to worsening asthma symptoms (asthma attack). °· Seeking emergency care when it is needed. °WHAT ARE SOME WAYS TO PREVENT AN ASTHMA ATTACK? °Have a Plan °Work with your health care provider to create a written plan for managing and treating your asthma attacks (asthma action plan). This plan includes: °· A list of your asthma triggers and how you can avoid them. °· Information on when medicines should be taken and when their dosages should be changed. °· The use of a device that measures how well your lungs are   working (peak flow meter). °Monitor Your Asthma °Use your peak flow meter and record your results in a journal every day. A drop in your peak flow numbers on one or more days may indicate the start of an asthma attack. This can happen even before you start to feel symptoms. You can prevent an asthma attack from getting worse by following the steps in your asthma action plan. °Avoid Asthma Triggers °Work with your asthma health care provider to find out what your asthma triggers are. This can be done by: °· Allergy testing. °· Keeping a journal that notes when asthma attacks occur and the factors that may have contributed to them. °· Determining if there are other medical conditions that are making your asthma  worse. °Once you have determined your asthma triggers, take steps to avoid them. This may include avoiding excessive or prolonged exposure to: °· Dust. Have someone dust and vacuum your home for you once or twice a week. Using a high-efficiency particulate arrestance (HEPA) vacuum is best. °· Smoke. This includes campfire smoke, forest fire smoke, and secondhand smoke from tobacco products. °· Pet dander. Avoid contact with animals that you know you are allergic to. °· Allergens from trees, grasses or pollens. Avoid spending a lot of time outdoors when pollen counts are high, and on very windy days. °· Very cold, dry, or humid air. °· Mold. °· Foods that contain high amounts of sulfites. °· Strong odors. °· Outdoor air pollutants, such as engine exhaust. °· Indoor air pollutants, such as aerosol sprays and fumes from household cleaners. °· Household pests, including dust mites and cockroaches, and pest droppings. °· Certain medicines, including NSAIDs. Always talk to your health care provider before stopping or starting any new medicines. °Medicines °Take over-the-counter and prescription medicines only as told by your health care provider. Many asthma attacks can be prevented by carefully following your medicine schedule. Taking your medicines correctly is especially important when you cannot avoid certain asthma triggers. °Act Quickly °If an asthma attack does happen, acting quickly can decrease how severe it is and how long it lasts. Take these steps:  °· Pay attention to your symptoms. If you are coughing, wheezing, or having difficulty breathing, do not wait to see if your symptoms go away on their own. Follow your asthma action plan. °· If you have followed your asthma action plan and your symptoms are not improving, call your health care provider or seek immediate medical care at the nearest hospital. °It is important to note how often you need to use your fast-acting rescue inhaler. If you are using your  rescue inhaler more often, it may mean that your asthma is not under control. Adjusting your asthma treatment plan may help you to prevent future asthma attacks and help you to gain better control of your condition. °HOW CAN I PREVENT AN ASTHMA ATTACK WHEN I EXERCISE? °Follow advice from your health care provider about whether you should use your fast-acting inhaler before exercising. Many people with asthma experience exercise-induced bronchoconstriction (EIB). This condition often worsens during vigorous exercise in cold, humid, or dry environments. Usually, people with EIB can stay very active by pre-treating with a fast-acting inhaler before exercising. °  °This information is not intended to replace advice given to you by your health care provider. Make sure you discuss any questions you have with your health care provider. °  °Document Released: 03/21/2009 Document Revised: 12/22/2014 Document Reviewed: 09/02/2014 °Elsevier Interactive Patient Education ©2016 [employer] ° °

## 2015-12-09 ENCOUNTER — Encounter (HOSPITAL_COMMUNITY): Payer: Self-pay | Admitting: Emergency Medicine

## 2015-12-09 ENCOUNTER — Emergency Department (HOSPITAL_COMMUNITY)
Admission: EM | Admit: 2015-12-09 | Discharge: 2015-12-09 | Disposition: A | Discharge status: Home / Self Care | Payer: Medicaid Other | Attending: Emergency Medicine | Admitting: Emergency Medicine

## 2015-12-09 ENCOUNTER — Emergency Department (HOSPITAL_COMMUNITY): Payer: Medicaid Other

## 2015-12-09 DIAGNOSIS — F1290 Cannabis use, unspecified, uncomplicated: Secondary | ICD-10-CM | POA: Diagnosis not present

## 2015-12-09 DIAGNOSIS — J45901 Unspecified asthma with (acute) exacerbation: Secondary | ICD-10-CM | POA: Insufficient documentation

## 2015-12-09 DIAGNOSIS — F17210 Nicotine dependence, cigarettes, uncomplicated: Secondary | ICD-10-CM | POA: Insufficient documentation

## 2015-12-09 DIAGNOSIS — F17200 Nicotine dependence, unspecified, uncomplicated: Secondary | ICD-10-CM | POA: Insufficient documentation

## 2015-12-09 DIAGNOSIS — R0602 Shortness of breath: Secondary | ICD-10-CM | POA: Diagnosis present

## 2015-12-09 LAB — BASIC METABOLIC PANEL
ANION GAP: 6 (ref 5–15)
BUN: 16 mg/dL (ref 6–20)
CHLORIDE: 108 mmol/L (ref 101–111)
CO2: 26 mmol/L (ref 22–32)
Calcium: 8.9 mg/dL (ref 8.9–10.3)
Creatinine, Ser: 1.01 mg/dL (ref 0.61–1.24)
GFR calc Af Amer: >60 mL/min (ref >60)
GLUCOSE: 95 mg/dL (ref 65–99)
POTASSIUM: 4 mmol/L (ref 3.5–5.1)
Sodium: 140 mmol/L (ref 135–145)

## 2015-12-09 LAB — CBC
HEMATOCRIT: 45 % (ref 39.0–52.0)
HEMOGLOBIN: 14.5 g/dL (ref 13.0–17.0)
MCH: 28.3 pg (ref 26.0–34.0)
MCHC: 32.2 g/dL (ref 30.0–36.0)
MCV: 87.9 fL (ref 78.0–100.0)
Platelets: 211 10*3/uL (ref 150–400)
RBC: 5.12 MIL/uL (ref 4.22–5.81)
RDW: 14.4 % (ref 11.5–15.5)
WBC: 6.5 10*3/uL (ref 4.0–10.5)

## 2015-12-09 LAB — I-STAT TROPONIN, ED: Troponin i, poc: 0 ng/mL (ref 0.00–0.08)

## 2015-12-09 MED ORDER — PREDNISONE 10 MG PO TABS: 40.0000 mg | ORAL_TABLET | Freq: Every day | ORAL | 0 refills | Status: DC

## 2015-12-09 MED ORDER — ALBUTEROL SULFATE HFA 108 (90 BASE) MCG/ACT IN AERS
2.0000 | INHALATION_SPRAY | Freq: Once | RESPIRATORY_TRACT | Status: AC
  Administered 2015-12-09: 2 via RESPIRATORY_TRACT
  Filled 2015-12-09: qty 6.7

## 2015-12-09 MED ORDER — FLUTICASONE-SALMETEROL 250-50 MCG/DOSE IN AEPB: 1.0000 | INHALATION_SPRAY | Freq: Every day | RESPIRATORY_TRACT | 6 refills | Status: DC

## 2015-12-09 MED ORDER — ALBUTEROL SULFATE HFA 108 (90 BASE) MCG/ACT IN AERS: 2.0000 | INHALATION_SPRAY | RESPIRATORY_TRACT | 6 refills | Status: DC | PRN

## 2015-12-09 MED ORDER — IBUPROFEN 800 MG PO TABS
800.0000 mg | ORAL_TABLET | Freq: Once | ORAL | Status: AC
  Administered 2015-12-09: 800 mg via ORAL
  Filled 2015-12-09: qty 1

## 2015-12-09 NOTE — Discharge Instructions (Signed)
Take medications as prescribed. Return without fail for worsening symptoms, including worsening pain, difficulty breathing, passing out, or any other symptoms concerning to you.

## 2015-12-09 NOTE — ED Provider Notes (Signed)
[facility]-EMERGENCY DEPT Provider Note   CSN: [id number] Arrival date & time: 12/09/15  1105     History   Chief Complaint Chief Complaint  Patient presents with  . Shortness of Breath    HPI Joseph Huff is a 35 y.o. male.  HPI 35 year old male who presents with SOB. History of asthma, typically well controlled on advair and albuterol rescue inhaler. Out of his inhalers now for a few weeks, and recently with increased cough, phelgm. Severe progressive dyspnea today while at work with mild chest discomfort, diaphoresis,and inability to talk due to labored breathing. No orthopnea, PND, LE edema or pain. No n/v, abd pain.   EMS called and patient received albuterol, atrovent, and solumedrol. Symptoms significantly improved afterwards per patient.   Past Medical History:  Diagnosis Date  . Asthma     There are no active problems to display for this patient.   History reviewed. No pertinent surgical history.     Home Medications    Prior to Admission medications   Medication Sig Start Date End Date Taking? Authorizing Provider  Fluticasone-Salmeterol (ADVAIR DISKUS) 250-50 MCG/DOSE AEPB Inhale 1 puff into the lungs daily. 10/24/15  Yes Antony Madura, PA-C  albuterol (PROVENTIL HFA;VENTOLIN HFA) 108 (90 Base) MCG/ACT inhaler Inhale 2 puffs into the lungs every 4 (four) hours as needed for wheezing or shortness of breath. 12/09/15   Lavera Guise, MD  Fluticasone-Salmeterol (ADVAIR DISKUS) 250-50 MCG/DOSE AEPB Inhale 1 puff into the lungs daily. 12/09/15   Lavera Guise, MD  predniSONE (DELTASONE) 10 MG tablet Take 4 tablets (40 mg total) by mouth daily. 12/09/15   Lavera Guise, MD  predniSONE (DELTASONE) 20 MG tablet Take 3 tablets (60 mg total) by mouth 2 (two) times daily. Patient not taking: Reported on 12/09/2015 10/24/15   Antony Madura, PA-C    Family History No family history on file.  Social History Social History  Substance Use Topics  . Smoking status: Current Every Day  Smoker  . Smokeless tobacco: Never Used  . Alcohol use Yes     Comment: occ     Allergies   Review of patient's allergies indicates no known allergies.   Review of Systems Review of Systems 10/14 systems reviewed and are negative other than those stated in the HPI   Physical Exam Updated Vital Signs BP 104/64 (BP Location: Right Arm)   Pulse 72   Temp 97.5 F (36.4 C) (Axillary)   Ht 6' (1.829 m)   Wt 187 lb (84.8 kg)   SpO2 100%   BMI 25.36 kg/m   Physical Exam Physical Exam  Nursing note and vitals reviewed. Constitutional: Well developed, well nourished, non-toxic, and in no acute distress Head: Normocephalic and atraumatic.  Mouth/Throat: Oropharynx is clear and moist.  Neck: Normal range of motion. Neck supple.  Cardiovascular: Normal rate and regular rhythm.   Pulmonary/Chest: Effort normal and breath sounds normal.  Abdominal: Soft. There is no tenderness. There is no rebound and no guarding.  Musculoskeletal: Normal range of motion. No edema Neurological: Alert, no facial droop, fluent speech, moves all extremities symmetrically Skin: Skin is warm and dry.  Psychiatric: Cooperative   ED Treatments / Results  Labs (all labs ordered are listed, but only abnormal results are displayed) Labs Reviewed  BASIC METABOLIC PANEL  CBC  I-STAT TROPOININ, ED    EKG  EKG Interpretation  Date/Time:  Friday December 09 2015 11:42:27 EDT Ventricular Rate:  75 PR Interval:    QRS  Duration: 95 QT Interval:  383 QTC Calculation: 428 R Axis:   64 Text Interpretation:  Sinus rhythm ST elev, probable normal early repol pattern No acute changes Confirmed by Adlai Sinning MD, Suella Cogar [phone]([id number]) on 12/09/2015 12:46:55 PM       Radiology Dg Chest 2 View  Result Date: 12/09/2015 CLINICAL DATA:  Pt is current smoker of 18 years. Not taking HTN meds. Not diabetic. Pt had an asthma attack this AM. Causing chest pain and coughing. Pt has lower back pain now as well. EXAM: CHEST  2 VIEW  COMPARISON:  10/02/2015 FINDINGS: Heart and mediastinal contours are within normal limits. No focal opacities or effusions. No acute bony abnormality. IMPRESSION: No active cardiopulmonary disease. Electronically Signed   By: Charlett NoseKevin  Dover M.D.   On: 12/09/2015 12:02    Procedures Procedures (including critical care time)  Medications Ordered in ED Medications  ibuprofen (ADVIL,MOTRIN) tablet 800 mg (800 mg Oral Given 12/09/15 1254)  albuterol (PROVENTIL HFA;VENTOLIN HFA) 108 (90 Base) MCG/ACT inhaler 2 puff (2 puffs Inhalation Given 12/09/15 1256)     Initial Impression / Assessment and Plan / ED Course  I have reviewed the triage vital signs and the nursing notes.  Pertinent labs & imaging results that were available during my care of the patient were reviewed by me and considered in my medical decision making (see chart for details).  Clinical Course   History of asthma p/w exacerbation in setting of missed medications. Back to baseline after receiving breathing treatments and steroids by EMS. On my eval, he is comfortable on room air, no conversational dyspnea, with normal pulse ox and with clear lungs. Presentation consistent with that of an acute asthma exacerbation. CXR unremarkable. No concern for other acute cardiopulmonary processes at this time. Blood work unremarkable. Stable for discharge. He is given prescription for Advair and albuterol rescue inhaler. Discharge with short course of steroids.  The patient appears reasonably screened and/or stabilized for discharge and I doubt any other medical condition or other [facility] requiring further screening, evaluation, or treatment in the ED at this time prior to discharge.  Strict return and follow-up instructions reviewed. He expressed understanding of all discharge instructions and felt comfortable with the plan of care.   Final Clinical Impressions(s) / ED Diagnoses   Final diagnoses:  Asthma exacerbation    New Prescriptions New  Prescriptions   ALBUTEROL (PROVENTIL HFA;VENTOLIN HFA) 108 (90 BASE) MCG/ACT INHALER    Inhale 2 puffs into the lungs every 4 (four) hours as needed for wheezing or shortness of breath.   FLUTICASONE-SALMETEROL (ADVAIR DISKUS) 250-50 MCG/DOSE AEPB    Inhale 1 puff into the lungs daily.   PREDNISONE (DELTASONE) 10 MG TABLET    Take 4 tablets (40 mg total) by mouth daily.     Lavera Guiseana Duo Daryl Quiros, MD 12/09/15 [phone]

## 2015-12-09 NOTE — ED Notes (Signed)
Bed: WA20 Expected date:  Expected time:  Means of arrival:  Comments: 

## 2015-12-09 NOTE — ED Triage Notes (Signed)
Pt arrived by [facility], pt stated he does not have a prescribed inhaler. Pt reported SOB more than usual getting off the bus. Pt had expiratory wheezing, pt given 10mg  albuterol, 0.5mg  Atrivent, 125mg  SoluMedrol per EMS. Pt NSR. Per EMS, no chest pain, nausea, or vomiting. Pt stated past day he had a productive cough with a fever, chills & sweaty during the night. 18g left AC.

## 2016-01-03 ENCOUNTER — Encounter (HOSPITAL_COMMUNITY): Payer: Self-pay

## 2016-01-03 DIAGNOSIS — F17200 Nicotine dependence, unspecified, uncomplicated: Secondary | ICD-10-CM | POA: Insufficient documentation

## 2016-01-03 DIAGNOSIS — W228XXA Striking against or struck by other objects, initial encounter: Secondary | ICD-10-CM | POA: Insufficient documentation

## 2016-01-03 DIAGNOSIS — J45909 Unspecified asthma, uncomplicated: Secondary | ICD-10-CM | POA: Insufficient documentation

## 2016-01-03 DIAGNOSIS — Y939 Activity, unspecified: Secondary | ICD-10-CM | POA: Diagnosis not present

## 2016-01-03 DIAGNOSIS — S91332A Puncture wound without foreign body, left foot, initial encounter: Secondary | ICD-10-CM | POA: Insufficient documentation

## 2016-01-03 DIAGNOSIS — Z79899 Other long term (current) drug therapy: Secondary | ICD-10-CM | POA: Insufficient documentation

## 2016-01-03 DIAGNOSIS — Y999 Unspecified external cause status: Secondary | ICD-10-CM | POA: Insufficient documentation

## 2016-01-03 DIAGNOSIS — Y929 Unspecified place or not applicable: Secondary | ICD-10-CM | POA: Insufficient documentation

## 2016-01-03 DIAGNOSIS — S91302A Unspecified open wound, left foot, initial encounter: Secondary | ICD-10-CM | POA: Diagnosis present

## 2016-01-03 NOTE — ED Triage Notes (Signed)
Pt comes via [facility], pt was walking down sidewalk, tried to avoid a nail and stepped on another one, nail went through bottom of foot, no exit wound, pt removed nail himself. Pt has never had a tetanus

## 2016-01-04 ENCOUNTER — Emergency Department (HOSPITAL_COMMUNITY)
Admission: EM | Admit: 2016-01-04 | Discharge: 2016-01-04 | Disposition: A | Discharge status: Home / Self Care | Payer: Medicaid Other | Attending: Emergency Medicine | Admitting: Emergency Medicine

## 2016-01-04 ENCOUNTER — Emergency Department (HOSPITAL_COMMUNITY): Payer: Medicaid Other

## 2016-01-04 DIAGNOSIS — T148XXA Other injury of unspecified body region, initial encounter: Secondary | ICD-10-CM

## 2016-01-04 MED ORDER — CIPROFLOXACIN HCL 500 MG PO TABS
500.0000 mg | ORAL_TABLET | Freq: Once | ORAL | Status: AC
  Administered 2016-01-04: 500 mg via ORAL
  Filled 2016-01-04: qty 1

## 2016-01-04 MED ORDER — CIPROFLOXACIN HCL 500 MG PO TABS: 500.0000 mg | ORAL_TABLET | Freq: Two times a day (BID) | ORAL | 0 refills | Status: AC

## 2016-01-04 MED ORDER — TETANUS-DIPHTH-ACELL PERTUSSIS 5-2.5-18.5 LF-MCG/0.5 IM SUSP
0.5000 mL | Freq: Once | INTRAMUSCULAR | Status: AC
  Administered 2016-01-04: 0.5 mL via INTRAMUSCULAR
  Filled 2016-01-04: qty 0.5

## 2016-01-04 NOTE — ED Provider Notes (Signed)
[facility]-EMERGENCY DEPT Provider Note   CSN: [id number] Arrival date & time: 01/03/16  2259     History   Chief Complaint Chief Complaint  Patient presents with  . nail in foot    HPI Joseph Huff is a 35 y.o. male.  Joseph Huff is a 35 y.o. male presents to ED with complaint of left foot wound. Patient, wearing shoes at the time, stepped on a nail this evening. No exit wound. Pt removed nail himself. Patient has associated pain, worse with walking, and loss of sensation on plantar surface of foot. He denies fever. He is able to ambulate; however, painful. No treatments tried PTA. No h/o immunocompromising conditions. Pt does not believe he has ever had a tetanus. Pt is sleepy in the room. States he hasn't slept well lately secondary to being homeless. He denies alcohol or drug use.       Past Medical History:  Diagnosis Date  . Asthma     There are no active problems to display for this patient.   History reviewed. No pertinent surgical history.     Home Medications    Prior to Admission medications   Medication Sig Start Date End Date Taking? Authorizing Provider  albuterol (PROVENTIL HFA;VENTOLIN HFA) 108 (90 Base) MCG/ACT inhaler Inhale 2 puffs into the lungs every 4 (four) hours as needed for wheezing or shortness of breath. 12/09/15   Lavera Guise, MD  ciprofloxacin (CIPRO) 500 MG tablet Take 1 tablet (500 mg total) by mouth every 12 (twelve) hours. 01/04/16 01/11/16  Lona Kettle, PA-C  Fluticasone-Salmeterol (ADVAIR DISKUS) 250-50 MCG/DOSE AEPB Inhale 1 puff into the lungs daily. 10/24/15   Antony Madura, PA-C  Fluticasone-Salmeterol (ADVAIR DISKUS) 250-50 MCG/DOSE AEPB Inhale 1 puff into the lungs daily. 12/09/15   Lavera Guise, MD  predniSONE (DELTASONE) 10 MG tablet Take 4 tablets (40 mg total) by mouth daily. 12/09/15   Lavera Guise, MD  predniSONE (DELTASONE) 20 MG tablet Take 3 tablets (60 mg total) by mouth 2 (two) times daily. Patient not taking: Reported on  12/09/2015 10/24/15   Antony Madura, PA-C    Family History No family history on file.  Social History Social History  Substance Use Topics  . Smoking status: Current Every Day Smoker  . Smokeless tobacco: Never Used  . Alcohol use Yes     Comment: occ     Allergies   Review of patient's allergies indicates no known allergies.   Review of Systems Review of Systems  Constitutional: Negative for fever.  Musculoskeletal: Positive for gait problem and myalgias.  Skin: Positive for wound.  Allergic/Immunologic: Negative for immunocompromised state.  Neurological: Positive for numbness.     Physical Exam Updated Vital Signs BP 107/82   Pulse 77   Temp 98.1 F (36.7 C)   Resp 18   Ht 6' (1.829 m)   Wt 84.8 kg   SpO2 98%   BMI 25.36 kg/m   Physical Exam  Constitutional: He appears well-developed and well-nourished. No distress.  HENT:  Head: Normocephalic and atraumatic.  Eyes: Conjunctivae are normal. No scleral icterus.  Neck: Normal range of motion. Neck supple.  Cardiovascular: Intact distal pulses.   Pulmonary/Chest: Effort normal. No respiratory distress.  Abdominal: He exhibits no distension.  Musculoskeletal:  0.5cm puncture wound to lateral aspect of plantar surface of left midfoot. TTP surrounding wound. Minimal erythema. Bleeding controlled. ROM of ankle intact. Patient able to move toes. Endorses decrease sensation on plantar surface of foot. 2+  DP pulses. Capillary refill <3seconds. Pt able to ambulate.   Neurological: He is alert.  Skin: Skin is warm and dry. Laceration noted. He is not diaphoretic.  Psychiatric: He has a normal mood and affect. His behavior is normal.     ED Treatments / Results  Labs (all labs ordered are listed, but only abnormal results are displayed) Labs Reviewed - No data to display  EKG  EKG Interpretation None       Radiology Dg Foot Complete Left  Result Date: 01/04/2016 CLINICAL DATA:  Initial valuation for acute  trauma, nail through bottom of foot. EXAM: LEFT FOOT - COMPLETE 3+ VIEW COMPARISON:  None. FINDINGS: There is no evidence of fracture or dislocation. There is no evidence of arthropathy or other focal bone abnormality. Soft tissues are unremarkable. No radiopaque foreign body. IMPRESSION: No acute osseous abnormality about the foot. No radiopaque foreign body identified. Electronically Signed   By: Rise MuBenjamin  McClintock M.D.   On: 01/04/2016 01:41    Procedures Procedures (including critical care time)  Medications Ordered in ED Medications  Tdap (BOOSTRIX) injection 0.5 mL (0.5 mLs Intramuscular Given 01/04/16 0124)  ciprofloxacin (CIPRO) tablet 500 mg (500 mg Oral Given 01/04/16 0205)     Initial Impression / Assessment and Plan / ED Course  I have reviewed the triage vital signs and the nursing notes.  Pertinent labs & imaging results that were available during my care of the patient were reviewed by me and considered in my medical decision making (see chart for details).  Clinical Course  Value Comment By Time  DG Foot Complete Left No evidence of fracture or dislocation. No evidence of radiopaque foreign body.  Lona Kettleshley Laurel Meyer, [place]PA-C 09/20 0200    Patient presents to ED with complaint of puncture wound. Patient is afebrile and non-toxic appearing in NAD. VSS. Physical exam remarkable for puncture wound to medial aspect of plantar surface of left midfoot. TTP. No appreciable warmth or erythema. ROM and strength intact. Decrease sensation. 2+ DP. X-ray negative for obvious fracture, dislocation, or foreign body. Patient able to ambulate. Wound explored in bloodless field. No obvious foreign body. Wound extensively cleaned with pressure irrigation. Wound not closed due to mechanism of injury - puncture via nail through shoe. ABX ointment and dressing applied. Tetanus updated. Since nail went through shoe, concern for possible pseudomonas infection; pt started on ABX for pseudomonas coverage.  Close follow up needed. Encouraged wound re-check in 2-3 days. Contact info for Orthopedics provided for ortho f/u. [facility] provided for establishing PCP. Pt currently homeless consult to social work for shelter resources and care management for PCP establishment.  Information on wound care provided. Return precautions given, specifically signs of infection. Pt voiced understanding and is agreeable.     Final Clinical Impressions(s) / ED Diagnoses   Final diagnoses:  Puncture wound    New Prescriptions Discharge Medication List as of 01/04/2016  2:04 AM    START taking these medications   Details  ciprofloxacin (CIPRO) 500 MG tablet Take 1 tablet (500 mg total) by mouth every 12 (twelve) hours., Starting Wed 01/04/2016, Until Wed 01/11/2016, Print         Lona KettleAshley Laurel Meyer, PA-C 01/04/16 [address]0403    Ashley Laurel Cedar HillsMeyer, PA-C 01/04/16 0404    Geoffery Lyonsouglas Delo, MD 01/04/16 [phone]

## 2016-01-04 NOTE — Discharge Instructions (Signed)
Read the information below.  Your x-rays did not show any obvious fracture, dislocation, or foreign bodies.  The wound was cleaned. Please keep wound clean and dry. Wash with warm soap and water, apply antibiotic ointment, and clean dressing.  You are being prescribed antibiotics. Please take as directed. This is very important for preventing infection.  Use the prescribed medication as directed.  Please discuss all new medications with your pharmacist.   I have provided the contact information for orthopedics. Please call tomorrow to schedule an appointment for further evaluation and management.  I have also provided the contact information for [facility] for establish a doctor. Please call.  You can return to ED for wound re-check in 2 days.  You may return to the Emergency Department at any time for worsening condition or any new symptoms that concern you. Return to ED if develop fever, redness, swelling, purulent drainage, or streaking up leg.

## 2016-01-12 ENCOUNTER — Encounter (HOSPITAL_COMMUNITY): Payer: Self-pay | Admitting: Emergency Medicine

## 2016-01-12 ENCOUNTER — Emergency Department (HOSPITAL_COMMUNITY): Payer: Medicaid Other

## 2016-01-12 ENCOUNTER — Emergency Department (HOSPITAL_COMMUNITY)
Admission: EM | Admit: 2016-01-12 | Discharge: 2016-01-13 | Disposition: A | Discharge status: Home / Self Care | Payer: Medicaid Other | Attending: Emergency Medicine | Admitting: Emergency Medicine

## 2016-01-12 DIAGNOSIS — J45901 Unspecified asthma with (acute) exacerbation: Secondary | ICD-10-CM

## 2016-01-12 DIAGNOSIS — F17200 Nicotine dependence, unspecified, uncomplicated: Secondary | ICD-10-CM | POA: Diagnosis not present

## 2016-01-12 DIAGNOSIS — J069 Acute upper respiratory infection, unspecified: Secondary | ICD-10-CM | POA: Diagnosis not present

## 2016-01-12 DIAGNOSIS — R0602 Shortness of breath: Secondary | ICD-10-CM | POA: Diagnosis present

## 2016-01-12 DIAGNOSIS — R0789 Other chest pain: Secondary | ICD-10-CM

## 2016-01-12 DIAGNOSIS — Z720 Tobacco use: Secondary | ICD-10-CM

## 2016-01-12 LAB — CBC WITH DIFFERENTIAL/PLATELET
Basophils Absolute: 0.1 10*3/uL (ref 0.0–0.1)
Basophils Relative: 1 %
EOS ABS: 0.2 10*3/uL (ref 0.0–0.7)
EOS PCT: 4 %
HCT: 43.9 % (ref 39.0–52.0)
Hemoglobin: 14.2 g/dL (ref 13.0–17.0)
LYMPHS ABS: 1.2 10*3/uL (ref 0.7–4.0)
LYMPHS PCT: 18 %
MCH: 28.7 pg (ref 26.0–34.0)
MCHC: 32.3 g/dL (ref 30.0–36.0)
MCV: 88.7 fL (ref 78.0–100.0)
MONO ABS: 0.2 10*3/uL (ref 0.1–1.0)
Monocytes Relative: 2 %
Neutro Abs: 4.8 10*3/uL (ref 1.7–7.7)
Neutrophils Relative %: 75 %
PLATELETS: 193 10*3/uL (ref 150–400)
RBC: 4.95 MIL/uL (ref 4.22–5.81)
RDW: 13.3 % (ref 11.5–15.5)
WBC: 6.4 10*3/uL (ref 4.0–10.5)

## 2016-01-12 LAB — BASIC METABOLIC PANEL
Anion gap: 8 (ref 5–15)
BUN: 19 mg/dL (ref 6–20)
CALCIUM: 8.9 mg/dL (ref 8.9–10.3)
CO2: 21 mmol/L — ABNORMAL LOW (ref 22–32)
CREATININE: 0.96 mg/dL (ref 0.61–1.24)
Chloride: 108 mmol/L (ref 101–111)
GFR calc Af Amer: >60 mL/min (ref >60)
GLUCOSE: 103 mg/dL — AB (ref 65–99)
POTASSIUM: 4.1 mmol/L (ref 3.5–5.1)
SODIUM: 137 mmol/L (ref 135–145)

## 2016-01-12 LAB — I-STAT TROPONIN, ED: Troponin i, poc: 0 ng/mL (ref 0.00–0.08)

## 2016-01-12 MED ORDER — ALBUTEROL SULFATE (2.5 MG/3ML) 0.083% IN NEBU
5.0000 mg | INHALATION_SOLUTION | Freq: Once | RESPIRATORY_TRACT | Status: AC
  Administered 2016-01-12: 5 mg via RESPIRATORY_TRACT
  Filled 2016-01-12: qty 6

## 2016-01-12 MED ORDER — PREDNISONE 20 MG PO TABS: ORAL_TABLET | ORAL | 0 refills | Status: DC

## 2016-01-12 MED ORDER — ALBUTEROL SULFATE HFA 108 (90 BASE) MCG/ACT IN AERS: 1.0000 | INHALATION_SPRAY | RESPIRATORY_TRACT | 0 refills | Status: DC | PRN

## 2016-01-12 MED ORDER — IPRATROPIUM BROMIDE 0.02 % IN SOLN
0.5000 mg | Freq: Once | RESPIRATORY_TRACT | Status: AC
  Administered 2016-01-12: 0.5 mg via RESPIRATORY_TRACT
  Filled 2016-01-12: qty 2.5

## 2016-01-12 MED ORDER — ALBUTEROL SULFATE HFA 108 (90 BASE) MCG/ACT IN AERS
1.0000 | INHALATION_SPRAY | RESPIRATORY_TRACT | Status: DC | PRN
Start: 2016-01-12 — End: 2016-01-13
  Administered 2016-01-12: 2 via RESPIRATORY_TRACT
  Filled 2016-01-12: qty 6.7

## 2016-01-12 MED ORDER — FLUTICASONE-SALMETEROL 250-50 MCG/DOSE IN AEPB: 1.0000 | INHALATION_SPRAY | Freq: Every day | RESPIRATORY_TRACT | 0 refills | Status: DC

## 2016-01-12 NOTE — Discharge Instructions (Signed)
Continue to stay well-hydrated. Continue to alternate between Tylenol and Ibuprofen for pain or fever. Use Mucinex for cough suppression/expectoration of mucus. Use netipot and flonase to help with nasal congestion. May consider over-the-counter Benadryl or other antihistamine (like zyrtec or claritin, etc) to decrease secretions and for help with your allergies/asthma symptoms. Use inhaler as directed, as needed for cough/chest congestion/wheezing/shortness of breath. Use advair as directed. Take prednisone as directed for your asthma exacerbation, starting tomorrow since today's dose was given to you here. Followup with your primary care doctor listed on your medicaid card in 5-7 days for recheck of ongoing symptoms and ongoing management of your asthma. STOP SMOKING! Return to emergency department for emergent changing or worsening of symptoms.

## 2016-01-12 NOTE — ED Notes (Signed)
Pt BIB EMS from home. Pt reports incr'd SOB today with productive cough. Per EMS, pt had inspiratory & expiratory wheezes. Given neb treatment & 125mg  solu-medrol by EMS. VSS.

## 2016-01-12 NOTE — ED Provider Notes (Signed)
[facility]PT Provider Note   CSN: [id number] Arrival date & time: 01/12/16  2101     History   Chief Complaint Chief Complaint  Patient presents with  . Shortness of Breath    HPI Joseph Huff is a 35 y.o. male with a PMHx of asthma, who presents to the ED with complaints of gradually worsening shortness of breath and cough with yellow sputum production that began today around 4 PM. Patient states he feels like he his prior asthma exacerbations. Tried to use his home inhaler with albuterol, but was only able to get one puff and then realized it had run out. Called 911 and EMS gave him a DuoNeb and 125 mg Solu-Medrol IV which he states has helped. His shortness of breath worsens with exertion. Associated symptoms include wheezing, chest tightness, clear rhinorrhea, and when he was feeling SOB before EMS arrived he was feeling some lightheadedness and diaphoresis. States that overall his symptoms have improved after the DuoNeb and Solu-Medrol. +Smoker, but attempting to quit.  He denies any fevers, chills, ear pain or drainage, sore throat, chest pain aside from tightness, hemoptysis, leg swelling, recent travel/surgery/immobilization, personal or family history of DVT/PE, abdominal pain, nausea, vomiting, diarrhea, constipation, dysuria, hematuria, numbness, tingling, focal weakness, orthopnea, or claudication. Denies any known family history of cardiac disease. He has run out of both of his inhalers.    The history is provided by the patient and medical records. No language interpreter was used.  Shortness of Breath  This is a recurrent problem. The average episode lasts 5 hours. The problem occurs intermittently.The current episode started 3 to 5 hours ago. The problem has been rapidly improving. Associated symptoms include rhinorrhea, cough, sputum production and wheezing. Pertinent negatives include no fever, no sore throat, no ear pain, no hemoptysis, no orthopnea, no chest pain, no  vomiting, no abdominal pain, no leg swelling and no claudication. Risk factors include smoking. He has tried beta-agonist inhalers and ipratropium inhalers for the symptoms. The treatment provided mild relief. He has had prior ED visits. Associated medical issues include asthma. Associated medical issues do not include PE, past MI, DVT or recent surgery.    Past Medical History:  Diagnosis Date  . Asthma     There are no active problems to display for this patient.   No past surgical history on file.     Home Medications    Prior to Admission medications   Medication Sig Start Date End Date Taking? Authorizing Provider  albuterol (PROVENTIL HFA;VENTOLIN HFA) 108 (90 Base) MCG/ACT inhaler Inhale 2 puffs into the lungs every 4 (four) hours as needed for wheezing or shortness of breath. 12/09/15   Lavera Guise, MD  Fluticasone-Salmeterol (ADVAIR DISKUS) 250-50 MCG/DOSE AEPB Inhale 1 puff into the lungs daily. 10/24/15   Antony Madura, PA-C  Fluticasone-Salmeterol (ADVAIR DISKUS) 250-50 MCG/DOSE AEPB Inhale 1 puff into the lungs daily. 12/09/15   Lavera Guise, MD  predniSONE (DELTASONE) 10 MG tablet Take 4 tablets (40 mg total) by mouth daily. 12/09/15   Lavera Guise, MD  predniSONE (DELTASONE) 20 MG tablet Take 3 tablets (60 mg total) by mouth 2 (two) times daily. Patient not taking: Reported on 12/09/2015 10/24/15   Antony Madura, PA-C    Family History No family history on file.  Social History Social History  Substance Use Topics  . Smoking status: Current Every Day Smoker  . Smokeless tobacco: Never Used  . Alcohol use Yes     Comment: occ  Allergies   Review of patient's allergies indicates no known allergies.   Review of Systems Review of Systems  Constitutional: Positive for diaphoresis. Negative for chills and fever.  HENT: Positive for rhinorrhea. Negative for ear discharge, ear pain and sore throat.   Respiratory: Positive for cough, sputum production, chest  tightness, shortness of breath and wheezing. Negative for hemoptysis.   Cardiovascular: Negative for chest pain, orthopnea, claudication and leg swelling.  Gastrointestinal: Negative for abdominal pain, constipation, diarrhea, nausea and vomiting.  Genitourinary: Negative for dysuria and hematuria.  Musculoskeletal: Negative for arthralgias and myalgias.  Skin: Negative for color change.  Allergic/Immunologic: Negative for immunocompromised state.  Neurological: Positive for light-headedness. Negative for weakness and numbness.  Psychiatric/Behavioral: Negative for confusion.   10 Systems reviewed and are negative for acute change except as noted in the HPI.   Physical Exam Updated Vital Signs BP 111/71 (BP Location: Right Arm)   Pulse 94   Temp 97.5 F (36.4 C) (Oral)   Resp 18   SpO2 100%   Physical Exam  Constitutional: He is oriented to person, place, and time. Vital signs are normal. He appears well-developed and well-nourished.  Non-toxic appearance. No distress.  Afebrile, nontoxic, NAD  HENT:  Head: Normocephalic and atraumatic.  Mouth/Throat: Oropharynx is clear and moist and mucous membranes are normal.  Eyes: Conjunctivae and EOM are normal. Right eye exhibits no discharge. Left eye exhibits no discharge.  Neck: Normal range of motion. Neck supple.  Cardiovascular: Normal rate, regular rhythm, normal heart sounds and intact distal pulses.  Exam reveals no gallop and no friction rub.   No murmur heard. RRR, nl s1/s2, no m/r/g, distal pulses intact, no pedal edema   Pulmonary/Chest: Effort normal. No respiratory distress. He has decreased breath sounds. He has no wheezes. He has no rhonchi. He has no rales.  Diminished lung sounds in all fields, poor air movement in all fields, no definite w/r/r heard but likely 2/2 poor air movement, no hypoxia or increased WOB, speaking in full sentences, SpO2 100% on RA   Abdominal: Soft. Normal appearance and bowel sounds are normal. He  exhibits no distension. There is no tenderness. There is no rigidity, no rebound, no guarding, no CVA tenderness, no tenderness at McBurney's point and negative Murphy's sign.  Musculoskeletal: Normal range of motion.  MAE x4 Strength and sensation grossly intact Distal pulses intact Gait steady No pedal edema, neg homan's bilaterally   Neurological: He is alert and oriented to person, place, and time. He has normal strength. No sensory deficit.  Skin: Skin is warm, dry and intact. No rash noted.  Psychiatric: He has a normal mood and affect.  Nursing note and vitals reviewed.    ED Treatments / Results  Labs (all labs ordered are listed, but only abnormal results are displayed) Labs Reviewed  BASIC METABOLIC PANEL - Abnormal; Notable for the following:       Result Value   CO2 21 (*)    Glucose, Bld 103 (*)    All other components within normal limits  CBC WITH DIFFERENTIAL/PLATELET  I-STAT TROPOININ, ED    EKG  EKG Interpretation None     ED ECG REPORT   Date: 01/12/2016  Rate: 83 bpm  Rhythm: normal sinus rhythm  QRS Axis: normal  Intervals: normal  ST/T Wave abnormalities: ST elevations, early repol pattern  Conduction Disutrbances:none  Narrative Interpretation:   Old EKG Reviewed: unchanged  I have personally reviewed the EKG tracing and agree with the computerized  printout as noted.   Radiology Dg Chest 2 View  Result Date: 01/12/2016 CLINICAL DATA:  Chest tightness.  Asthma exacerbation. EXAM: CHEST  2 VIEW COMPARISON:  12/09/2015 FINDINGS: The heart size and mediastinal contours are within normal limits. Both lungs are clear. The visualized skeletal structures are unremarkable. IMPRESSION: No active cardiopulmonary disease. Electronically Signed   By: Signa Kell M.D.   On: 01/12/2016 22:32    Procedures Procedures (including critical care time)  Medications Ordered in ED Medications  albuterol (PROVENTIL HFA;VENTOLIN HFA) 108 (90 Base) MCG/ACT  inhaler 1-2 puff (not administered)  albuterol (PROVENTIL) (2.5 MG/3ML) 0.083% nebulizer solution 5 mg (5 mg Nebulization Given 01/12/16 2225)  ipratropium (ATROVENT) nebulizer solution 0.5 mg (0.5 mg Nebulization Given 01/12/16 2225)     Initial Impression / Assessment and Plan / ED Course  I have reviewed the triage vital signs and the nursing notes.  Pertinent labs & imaging results that were available during my care of the patient were reviewed by me and considered in my medical decision making (see chart for details).  Clinical Course    35 y.o. male here with SOB especially with exertion, and productive cough that began today around 4pm, some chest tightness, clear rhinorrhea, and wheezing. Initially felt lightheaded and diaphoretic but this improved after EMS gave him duoneb and solumedrol 125mg  IV. Symptoms improved overall after this tx. On exam, lung sounds diminished, poor air movement, no definite wheezing likely due to poor air movement, no focal rhonchi/rales. No hypoxia or increased WOB, no LE swelling, no RFs of PE, and PERC neg, doubt PE and doubt dissection. Likely asthma exacerbation. Will get CXR, trop, EKG, CBC w/diff, and BMP, and give another duoneb to see how this helps. Declines wanting pain meds since he isn't really experiencing pain as much as chest tightness. Will reassess shortly.   11:39 PM Trop neg. EKG with ST elevation in an early repol pattern similar to prior EKGs, no acute ischemic findings. CXR without acute findings. BMP WNL. CBC w/diff WNL. Pt feeling much better after neb tx, symptoms have resolved, and his lung sounds are greatly improved after neb tx. No ongoing wheezing and much better air movement through his lungs. Will start on prednisone for short burst. Discussed starting daily antihistamine. OTC meds for symptom control related to allergies/?viral URI symptoms. Will send down albuterol inhaler for him to go home with, and write rx for refill of this as  well as refill of advair. Smoking cessation strongly encouraged and advised. F/up with PCP listed on his Medicaid card in 5-7 days for recheck and ongoing management of his asthma. I explained the diagnosis and have given explicit precautions to return to the ER including for any other new or worsening symptoms. The patient understands and accepts the medical plan as it's been dictated and I have answered their questions. Discharge instructions concerning home care and prescriptions have been given. The patient is STABLE and is discharged to home in good condition.   Final Clinical Impressions(s) / ED Diagnoses   Final diagnoses:  Asthma exacerbation  SOB (shortness of breath)  Tobacco user  URI (upper respiratory infection)  Chest tightness    New Prescriptions New Prescriptions   ALBUTEROL (PROVENTIL HFA;VENTOLIN HFA) 108 (90 BASE) MCG/ACT INHALER    Inhale 1-2 puffs into the lungs every 4 (four) hours as needed for wheezing or shortness of breath (or cough).   FLUTICASONE-SALMETEROL (ADVAIR DISKUS) 250-50 MCG/DOSE AEPB    Inhale 1 puff into the  lungs daily.   PREDNISONE (DELTASONE) 20 MG TABLET    3 tabs po daily x 4 days starting on 01/13/16     Allen DerryMercedes Camprubi-Soms, PA-C 01/12/16 2343    Lavera Guiseana Duo Liu, MD 01/14/16 [phone]

## 2016-01-13 NOTE — ED Notes (Signed)
Pt departed in NAD, refused use of wheelchair.  

## 2016-06-06 ENCOUNTER — Encounter (HOSPITAL_COMMUNITY): Payer: Self-pay

## 2016-06-06 ENCOUNTER — Emergency Department (HOSPITAL_COMMUNITY): Payer: Medicaid Other

## 2016-06-06 ENCOUNTER — Emergency Department (HOSPITAL_COMMUNITY)
Admission: EM | Admit: 2016-06-06 | Discharge: 2016-06-06 | Disposition: A | Discharge status: Home / Self Care | Payer: Medicaid Other | Attending: Emergency Medicine | Admitting: Emergency Medicine

## 2016-06-06 DIAGNOSIS — J45909 Unspecified asthma, uncomplicated: Secondary | ICD-10-CM | POA: Diagnosis present

## 2016-06-06 DIAGNOSIS — J45901 Unspecified asthma with (acute) exacerbation: Secondary | ICD-10-CM | POA: Diagnosis not present

## 2016-06-06 DIAGNOSIS — J09X2 Influenza due to identified novel influenza A virus with other respiratory manifestations: Secondary | ICD-10-CM | POA: Diagnosis not present

## 2016-06-06 DIAGNOSIS — F17200 Nicotine dependence, unspecified, uncomplicated: Secondary | ICD-10-CM | POA: Insufficient documentation

## 2016-06-06 DIAGNOSIS — J101 Influenza due to other identified influenza virus with other respiratory manifestations: Secondary | ICD-10-CM

## 2016-06-06 LAB — CBC WITH DIFFERENTIAL/PLATELET
BASOS ABS: 0.1 10*3/uL (ref 0.0–0.1)
BASOS PCT: 1 %
BASOS PCT: 1 %
Basophils Absolute: 0 10*3/uL (ref 0.0–0.1)
EOS ABS: 0.2 10*3/uL (ref 0.0–0.7)
EOS PCT: 3 %
Eosinophils Absolute: 0.1 10*3/uL (ref 0.0–0.7)
Eosinophils Relative: 2 %
HCT: 44.6 % (ref 39.0–52.0)
HEMATOCRIT: 41.5 % (ref 39.0–52.0)
Hemoglobin: 15.3 g/dL (ref 13.0–17.0)
Hemoglobin: 13.8 g/dL (ref 13.0–17.0)
LYMPHS PCT: 10 %
LYMPHS PCT: 8 %
Lymphs Abs: 0.5 10*3/uL — ABNORMAL LOW (ref 0.7–4.0)
Lymphs Abs: 0.5 10*3/uL — ABNORMAL LOW (ref 0.7–4.0)
MCH: 28.4 pg (ref 26.0–34.0)
MCH: 28 pg (ref 26.0–34.0)
MCHC: 34.3 g/dL (ref 30.0–36.0)
MCHC: 33.3 g/dL (ref 30.0–36.0)
MCV: 82.9 fL (ref 78.0–100.0)
MCV: 84.3 fL (ref 78.0–100.0)
Monocytes Absolute: 0.3 10*3/uL (ref 0.1–1.0)
Monocytes Absolute: 0.6 10*3/uL (ref 0.1–1.0)
Monocytes Relative: 6 %
Monocytes Relative: 8 %
NEUTROS ABS: 5.5 10*3/uL (ref 1.7–7.7)
Neutro Abs: 4.2 10*3/uL (ref 1.7–7.7)
Neutrophils Relative %: 81 %
Neutrophils Relative %: 81 %
Platelets: 172 10*3/uL (ref 150–400)
RBC: 5.38 MIL/uL (ref 4.22–5.81)
RBC: 4.92 MIL/uL (ref 4.22–5.81)
RDW: 14 % (ref 11.5–15.5)
RDW: 13.9 % (ref 11.5–15.5)
WBC: 5.3 10*3/uL (ref 4.0–10.5)
WBC: 6.8 10*3/uL (ref 4.0–10.5)

## 2016-06-06 LAB — BASIC METABOLIC PANEL
ANION GAP: 8 (ref 5–15)
BUN: 15 mg/dL (ref 6–20)
CO2: 25 mmol/L (ref 22–32)
Calcium: 8.4 mg/dL — ABNORMAL LOW (ref 8.9–10.3)
Chloride: 106 mmol/L (ref 101–111)
Creatinine, Ser: 1.17 mg/dL (ref 0.61–1.24)
GFR calc Af Amer: >60 mL/min (ref >60)
Glucose, Bld: 113 mg/dL — ABNORMAL HIGH (ref 65–99)
POTASSIUM: 3.4 mmol/L — AB (ref 3.5–5.1)
SODIUM: 139 mmol/L (ref 135–145)

## 2016-06-06 LAB — INFLUENZA PANEL BY PCR (TYPE A & B)
INFLAPCR: POSITIVE — AB
Influenza B By PCR: NEGATIVE

## 2016-06-06 MED ORDER — PREDNISONE 20 MG PO TABS: ORAL_TABLET | ORAL | 0 refills | Status: DC

## 2016-06-06 MED ORDER — OSELTAMIVIR PHOSPHATE 75 MG PO CAPS
75.0000 mg | ORAL_CAPSULE | Freq: Once | ORAL | Status: AC
  Administered 2016-06-06: 75 mg via ORAL
  Filled 2016-06-06: qty 1

## 2016-06-06 MED ORDER — ACETAMINOPHEN 500 MG PO TABS
1000.0000 mg | ORAL_TABLET | Freq: Once | ORAL | Status: AC
  Administered 2016-06-06: 1000 mg via ORAL
  Filled 2016-06-06: qty 2

## 2016-06-06 MED ORDER — POTASSIUM CHLORIDE CRYS ER 20 MEQ PO TBCR
40.0000 meq | EXTENDED_RELEASE_TABLET | Freq: Once | ORAL | Status: AC
  Administered 2016-06-06: 40 meq via ORAL
  Filled 2016-06-06: qty 2

## 2016-06-06 MED ORDER — IPRATROPIUM-ALBUTEROL 0.5-2.5 (3) MG/3ML IN SOLN
3.0000 mL | Freq: Once | RESPIRATORY_TRACT | Status: AC
  Administered 2016-06-06: 3 mL via RESPIRATORY_TRACT
  Filled 2016-06-06: qty 3

## 2016-06-06 MED ORDER — ALBUTEROL SULFATE HFA 108 (90 BASE) MCG/ACT IN AERS
2.0000 | INHALATION_SPRAY | Freq: Once | RESPIRATORY_TRACT | Status: AC
  Administered 2016-06-06: 2 via RESPIRATORY_TRACT
  Filled 2016-06-06: qty 6.7

## 2016-06-06 MED ORDER — OSELTAMIVIR PHOSPHATE 75 MG PO CAPS: 75.0000 mg | ORAL_CAPSULE | Freq: Two times a day (BID) | ORAL | 0 refills | Status: DC

## 2016-06-06 MED ORDER — PREDNISONE 20 MG PO TABS
60.0000 mg | ORAL_TABLET | Freq: Once | ORAL | Status: AC
  Administered 2016-06-06: 60 mg via ORAL
  Filled 2016-06-06: qty 3

## 2016-06-06 MED ORDER — SODIUM CHLORIDE 0.9 % IV BOLUS (SEPSIS)
1000.0000 mL | Freq: Once | INTRAVENOUS | Status: AC
  Administered 2016-06-06: 1000 mL via INTRAVENOUS

## 2016-06-06 NOTE — ED Notes (Signed)
ED Provider at bedside. 

## 2016-06-06 NOTE — ED Provider Notes (Signed)
[facility]-EMERGENCY DEPT Provider Note   CSN: [id number] Arrival date & time: 06/06/16  4132     History   Chief Complaint Chief Complaint  Patient presents with  . Asthma    HPI Joseph Huff is a 36 y.o. male.  HPI  36 year old male with a history of asthma presents with shortness of breath and cough since yesterday. Also describes a pounding headache, subjective fever (no thermometer), body aches, rhinorrhea, and mild sore throat. He states this feels like asthma but also like something else in addition to his asthma. Has not taken anything for his symptoms. Given 1-1/2 duonebs by EMS. Feels some improvement. 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 Bilateral back pain, worse with coughing. States he quit smoking 1 month ago.  Past Medical History:  Diagnosis Date  . Asthma     There are no active problems to display for this patient.   No past surgical history on file.     Home Medications    Prior to Admission medications   Medication Sig Start Date End Date Taking? Authorizing Provider  acetaminophen (TYLENOL) 500 MG tablet Take 500 mg by mouth every 6 (six) hours as needed.    Historical Provider, MD  albuterol (PROVENTIL HFA;VENTOLIN HFA) 108 (90 Base) MCG/ACT inhaler Inhale 2 puffs into the lungs every 4 (four) hours as needed for wheezing or shortness of breath. 12/09/15   Lavera Guise, MD  albuterol (PROVENTIL HFA;VENTOLIN HFA) 108 (90 Base) MCG/ACT inhaler Inhale 1-2 puffs into the lungs every 4 (four) hours as needed for wheezing or shortness of breath (or cough). 01/12/16   Mercedes Street, PA-C  Fluticasone-Salmeterol (ADVAIR DISKUS) 250-50 MCG/DOSE AEPB Inhale 1 puff into the lungs daily. 10/24/15   Antony Madura, PA-C  Fluticasone-Salmeterol (ADVAIR DISKUS) 250-50 MCG/DOSE AEPB Inhale 1 puff into the lungs daily. 12/09/15   Lavera Guise, MD  Fluticasone-Salmeterol (ADVAIR DISKUS) 250-50 MCG/DOSE AEPB Inhale 1 puff into the lungs daily. 01/12/16    Mercedes Street, PA-C  Ginkgo Biloba Extract 60 MG CAPS Take 60 mg by mouth daily.    Historical Provider, MD  predniSONE (DELTASONE) 10 MG tablet Take 4 tablets (40 mg total) by mouth daily. Patient not taking: Reported on 01/12/2016 12/09/15   Lavera Guise, MD  predniSONE (DELTASONE) 20 MG tablet Take 3 tablets (60 mg total) by mouth 2 (two) times daily. 10/24/15   Antony Madura, PA-C  predniSONE (DELTASONE) 20 MG tablet 3 tabs po daily x 4 days starting on 01/13/16 01/13/16   France [address], PA-C    Family History No family history on file.  Social History Social History  Substance Use Topics  . Smoking status: Current Every Day Smoker  . Smokeless tobacco: Never Used  . Alcohol use Yes     Comment: occ     Allergies   Patient has no known allergies.   Review of Systems Review of Systems  Constitutional: Positive for fever.  HENT: Positive for rhinorrhea and sore throat.   Respiratory: Positive for cough and shortness of breath.   Gastrointestinal: Negative for vomiting.  Musculoskeletal: Positive for back pain.  Neurological: Positive for headaches.  All other systems reviewed and are negative.    Physical Exam Updated Vital Signs There were no vitals taken for this visit.  Physical Exam  Constitutional: He is oriented to person, place, and time. He appears well-developed and well-nourished.  HENT:  Head: Normocephalic and atraumatic.  Right Ear: External ear normal.  Left  Ear: External ear normal.  Nose: Nose normal.  Eyes: Right eye exhibits no discharge. Left eye exhibits no discharge.  Neck: Normal range of motion. Neck supple.  Cardiovascular: Regular rhythm and normal heart sounds.  Tachycardia present.   Pulmonary/Chest: Effort normal. No accessory muscle usage. No respiratory distress. He has wheezes (slight).  Speaks in full sentences, no distress  Abdominal: Soft. There is no tenderness.  Musculoskeletal: He exhibits no edema.  General muscle soreness  without swelling  Neurological: He is alert and oriented to person, place, and time.  CN 3-12 grossly intact. 5/5 strength in all 4 extremities. Grossly normal sensation.  Skin: Skin is warm and dry.  Nursing note and vitals reviewed.    ED Treatments / Results  Labs (all labs ordered are listed, but only abnormal results are displayed) Labs Reviewed  INFLUENZA PANEL BY PCR (TYPE A & B) - Abnormal; Notable for the following:       Result Value   Influenza A By PCR POSITIVE (*)    All other components within normal limits  CBC WITH DIFFERENTIAL/PLATELET - Abnormal; Notable for the following:    Lymphs Abs 0.5 (*)    All other components within normal limits  BASIC METABOLIC PANEL - Abnormal; Notable for the following:    Potassium 3.4 (*)    Glucose, Bld 113 (*)    Calcium 8.4 (*)    All other components within normal limits  CBC WITH DIFFERENTIAL/PLATELET - Abnormal; Notable for the following:    Lymphs Abs 0.5 (*)    All other components within normal limits  CBC WITH DIFFERENTIAL/PLATELET    EKG  EKG Interpretation  Date/Time:  Wednesday June 06 2016 09:04:43 EST Ventricular Rate:  110 PR Interval:    QRS Duration: 88 QT Interval:  315 QTC Calculation: 427 R Axis:   79 Text Interpretation:  Sinus tachycardia Right atrial enlargement ST elev, probable normal early repol pattern rate is faster, otherwise no significant change since Sept 2017 Confirmed by Criss Alvine MD, Asaiah Hunnicutt [phone]) on 06/06/2016 9:14:18 AM       Radiology Dg Chest 2 View  Result Date: 06/06/2016 CLINICAL DATA:  Wheezing and shortness of breath EXAM: CHEST  2 VIEW COMPARISON:  May 14, 2015 FINDINGS: There is no edema or consolidation. Heart size and pulmonary vascularity are normal. No adenopathy. No bone lesions. IMPRESSION: No edema or consolidation. Electronically Signed   By: Bretta Bang III M.D.   On: 06/06/2016 09:18    Procedures Procedures (including critical care  time)  Medications Ordered in ED Medications  oseltamivir (TAMIFLU) capsule 75 mg (not administered)  albuterol (PROVENTIL HFA;VENTOLIN HFA) 108 (90 Base) MCG/ACT inhaler 2 puff (not administered)  potassium chloride SA (K-DUR,KLOR-CON) CR tablet 40 mEq (not administered)  acetaminophen (TYLENOL) tablet 1,000 mg (1,000 mg Oral Given 06/06/16 0922)  ipratropium-albuterol (DUONEB) 0.5-2.5 (3) MG/3ML nebulizer solution 3 mL (3 mLs Nebulization Given 06/06/16 0923)  sodium chloride 0.9 % bolus 1,000 mL (0 mLs Intravenous Stopped 06/06/16 1034)  predniSONE (DELTASONE) tablet 60 mg (60 mg Oral Given 06/06/16 0950)     Initial Impression / Assessment and Plan / ED Course  I have reviewed the triage vital signs and the nursing notes.  Pertinent labs & imaging results that were available during my care of the patient were reviewed by me and considered in my medical decision making (see chart for details).  Clinical Course as of Jun 07 911  Wed Jun 06, 2016  6045 Will check for  flu as he would need treatment given asthma hx as well as CXR. He appears to have flu vs PNA vs URI with asthma.  [SG]    Clinical Course User Index [SG] Pricilla Loveless, MD    Patient's presentation is consistent with influenza and an asthma exacerbation. He feels better after Tylenol, fluids, and albuterol treatment. He was given a dose of prednisone and one dose of Tamiflu. Discussed with patient and he would like to take Tamiflu given that he has higher risk with his asthma. He overall appears well. He is not hypoxic and his heart rate has improved. No hypotension. No vomiting. He does not have a PCP, I have encouraged him to follow-up with the [facility]. He appears stable for outpatient treatment. He feels like he can afford the Tamiflu and states he has insurance and he was given 2 Good Rx coupons to assist. Given an albuterol inhaler here as he has run out. Strict return precautions.   Final  Clinical Impressions(s) / ED Diagnoses   Final diagnoses:  Influenza A  Exacerbation of asthma, unspecified asthma severity, unspecified whether persistent    New Prescriptions New Prescriptions   OSELTAMIVIR (TAMIFLU) 75 MG CAPSULE    Take 1 capsule (75 mg total) by mouth every 12 (twelve) hours.   PREDNISONE (DELTASONE) 20 MG TABLET    2 tabs po daily x 4 days     Pricilla Loveless, MD 06/06/16 1142

## 2016-06-06 NOTE — ED Notes (Signed)
Bed: WA05 Expected date:  Expected time:  Means of arrival:  Comments: EMS-SOB 

## 2016-06-06 NOTE — ED Triage Notes (Signed)
He c/o wheezing/shob since yesterday. He arrives in E.D. Short of breath and is receiving a second h.h.n. Treatment. Audible wheezes present. He is short of breath, but not dyspneic. He told EMS he is recently homeless and recently lost his job.

## 2016-06-20 ENCOUNTER — Encounter (HOSPITAL_COMMUNITY): Payer: Self-pay | Admitting: Emergency Medicine

## 2016-06-20 ENCOUNTER — Emergency Department (HOSPITAL_COMMUNITY): Payer: Medicaid Other

## 2016-06-20 ENCOUNTER — Emergency Department (HOSPITAL_COMMUNITY)
Admission: EM | Admit: 2016-06-20 | Discharge: 2016-06-20 | Disposition: A | Discharge status: Home / Self Care | Payer: Medicaid Other | Attending: Emergency Medicine | Admitting: Emergency Medicine

## 2016-06-20 DIAGNOSIS — J45909 Unspecified asthma, uncomplicated: Secondary | ICD-10-CM | POA: Insufficient documentation

## 2016-06-20 DIAGNOSIS — F17200 Nicotine dependence, unspecified, uncomplicated: Secondary | ICD-10-CM | POA: Diagnosis not present

## 2016-06-20 DIAGNOSIS — Z79899 Other long term (current) drug therapy: Secondary | ICD-10-CM | POA: Insufficient documentation

## 2016-06-20 DIAGNOSIS — R55 Syncope and collapse: Secondary | ICD-10-CM

## 2016-06-20 LAB — CBC WITH DIFFERENTIAL/PLATELET
Basophils Absolute: 0.1 10*3/uL (ref 0.0–0.1)
Basophils Relative: 1 %
Eosinophils Absolute: 0.4 10*3/uL (ref 0.0–0.7)
Eosinophils Relative: 5 %
HCT: 43.6 % (ref 39.0–52.0)
HEMOGLOBIN: 14.7 g/dL (ref 13.0–17.0)
LYMPHS ABS: 2.6 10*3/uL (ref 0.7–4.0)
LYMPHS PCT: 37 %
MCH: 28.6 pg (ref 26.0–34.0)
MCHC: 33.7 g/dL (ref 30.0–36.0)
MCV: 84.8 fL (ref 78.0–100.0)
Monocytes Absolute: 0.3 10*3/uL (ref 0.1–1.0)
Monocytes Relative: 5 %
NEUTROS ABS: 3.6 10*3/uL (ref 1.7–7.7)
NEUTROS PCT: 52 %
Platelets: 237 10*3/uL (ref 150–400)
RBC: 5.14 MIL/uL (ref 4.22–5.81)
RDW: 13.7 % (ref 11.5–15.5)
WBC: 7 10*3/uL (ref 4.0–10.5)

## 2016-06-20 LAB — BASIC METABOLIC PANEL
Anion gap: 12 (ref 5–15)
BUN: 16 mg/dL (ref 6–20)
CHLORIDE: 104 mmol/L (ref 101–111)
CO2: 22 mmol/L (ref 22–32)
Calcium: 9.3 mg/dL (ref 8.9–10.3)
Creatinine, Ser: 1.05 mg/dL (ref 0.61–1.24)
GFR calc Af Amer: >60 mL/min (ref >60)
GFR calc non Af Amer: >60 mL/min (ref >60)
Glucose, Bld: 82 mg/dL (ref 65–99)
POTASSIUM: 3.8 mmol/L (ref 3.5–5.1)
SODIUM: 138 mmol/L (ref 135–145)

## 2016-06-20 LAB — CBG MONITORING, ED: GLUCOSE-CAPILLARY: 78 mg/dL (ref 65–99)

## 2016-06-20 MED ORDER — SODIUM CHLORIDE 0.9 % IV BOLUS (SEPSIS)
1000.0000 mL | Freq: Once | INTRAVENOUS | Status: AC
  Administered 2016-06-20: 1000 mL via INTRAVENOUS

## 2016-06-20 NOTE — ED Notes (Signed)
Per MD gave pt. Food and drink.

## 2016-06-20 NOTE — ED Notes (Signed)
Waiting for fluids to finish per MD order before discharge
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
## 2016-06-20 NOTE — ED Triage Notes (Signed)
Per pt he stated he is homeless and was very cold today and felt like his legs had given out and couldn't walk. He stated he got a very bad headache, the right side of his face was chattering making his jaw very sore.

## 2016-06-20 NOTE — ED Notes (Signed)
Patient transported to X-ray 

## 2016-06-27 ENCOUNTER — Emergency Department (HOSPITAL_COMMUNITY)
Admission: EM | Admit: 2016-06-27 | Discharge: 2016-06-27 | Disposition: A | Discharge status: Home / Self Care | Payer: Medicaid Other | Attending: Emergency Medicine | Admitting: Emergency Medicine

## 2016-06-27 ENCOUNTER — Encounter (HOSPITAL_COMMUNITY): Payer: Self-pay | Admitting: *Deleted

## 2016-06-27 ENCOUNTER — Emergency Department (HOSPITAL_COMMUNITY): Payer: Medicaid Other

## 2016-06-27 DIAGNOSIS — J4541 Moderate persistent asthma with (acute) exacerbation: Secondary | ICD-10-CM

## 2016-06-27 DIAGNOSIS — Z79899 Other long term (current) drug therapy: Secondary | ICD-10-CM | POA: Diagnosis not present

## 2016-06-27 DIAGNOSIS — F17200 Nicotine dependence, unspecified, uncomplicated: Secondary | ICD-10-CM | POA: Insufficient documentation

## 2016-06-27 DIAGNOSIS — J45901 Unspecified asthma with (acute) exacerbation: Secondary | ICD-10-CM | POA: Diagnosis present

## 2016-06-27 MED ORDER — ALBUTEROL SULFATE HFA 108 (90 BASE) MCG/ACT IN AERS
6.0000 | INHALATION_SPRAY | Freq: Once | RESPIRATORY_TRACT | Status: AC
  Administered 2016-06-27: 6 via RESPIRATORY_TRACT
  Filled 2016-06-27: qty 6.7

## 2016-06-27 MED ORDER — DEXAMETHASONE 4 MG PO TABS
10.0000 mg | ORAL_TABLET | Freq: Once | ORAL | Status: AC
  Administered 2016-06-27: 23:00:00 10 mg via ORAL
  Filled 2016-06-27: qty 2

## 2016-06-27 NOTE — ED Triage Notes (Signed)
Per EMS, pt w/ hx of asthma complains of difficulty breathing for the past 4 hours. Pt had wheezing in all fields upon arrival. PT received 5mg  albuterol and duoneb, 125 solumedrol en route to hospital. Pt has had productive cough x 1 month.  BP111/67 HR 70 spo2 93% on neb

## 2016-06-27 NOTE — ED Notes (Signed)
Bed: WA03 Expected date:  Expected time:  Means of arrival:  Comments: 36 yo m asthma, wheezing

## 2016-06-27 NOTE — Discharge Instructions (Signed)
Use your inhaler every 4 hours(6 puffs) while awake, return for sudden worsening shortness of breath, or if you need to use your inhaler more often.  ° °

## 2016-06-28 NOTE — ED Provider Notes (Signed)
[facility]-EMERGENCY DEPT Provider Note   CSN: [id number] Arrival date & time: 06/27/16  2153     History   Chief Complaint Chief Complaint  Patient presents with  . Asthma    HPI Joseph Huff is a 36 y.o. male.  36 yo M with a cc of asthma exacerbation.  Ran out of meds slowly worsening over past week. Denies infectious symptoms.  Feels like prior asthma.  No prior hospitalization or ICU admits.    The history is provided by the patient.  Asthma  This is a new problem. The current episode started less than 1 hour ago. The problem occurs constantly. The problem has not changed since onset.Associated symptoms include shortness of breath. Pertinent negatives include no chest pain, no abdominal pain and no headaches. Nothing aggravates the symptoms. Nothing relieves the symptoms. He has tried nothing for the symptoms.  Illness  This is a new problem. The current episode started less than 1 hour ago. The problem occurs constantly. The problem has not changed since onset.Associated symptoms include shortness of breath. Pertinent negatives include no chest pain, no abdominal pain and no headaches. Nothing aggravates the symptoms. Nothing relieves the symptoms. He has tried nothing for the symptoms. The treatment provided no relief.    Past Medical History:  Diagnosis Date  . Asthma     There are no active problems to display for this patient.   History reviewed. No pertinent surgical history.     Home Medications    Prior to Admission medications   Medication Sig Start Date End Date Taking? Authorizing Provider  albuterol (PROVENTIL HFA;VENTOLIN HFA) 108 (90 Base) MCG/ACT inhaler Inhale 2 puffs into the lungs every 4 (four) hours as needed for wheezing or shortness of breath. Patient not taking: Reported on 06/06/2016 12/09/15   Lavera Guise, MD  albuterol (PROVENTIL HFA;VENTOLIN HFA) 108 (90 Base) MCG/ACT inhaler Inhale 1-2 puffs into the lungs every 4 (four) hours as needed for  wheezing or shortness of breath (or cough). 01/12/16   Mercedes Street, PA-C  Fluticasone-Salmeterol (ADVAIR DISKUS) 250-50 MCG/DOSE AEPB Inhale 1 puff into the lungs daily. Patient not taking: Reported on 06/06/2016 10/24/15   Antony Madura, PA-C  Fluticasone-Salmeterol (ADVAIR DISKUS) 250-50 MCG/DOSE AEPB Inhale 1 puff into the lungs daily. Patient not taking: Reported on 06/06/2016 12/09/15   Lavera Guise, MD  Fluticasone-Salmeterol (ADVAIR DISKUS) 250-50 MCG/DOSE AEPB Inhale 1 puff into the lungs daily. Patient not taking: Reported on 06/06/2016 01/12/16   [facility] Street, PA-C  oseltamivir (TAMIFLU) 75 MG capsule Take 1 capsule (75 mg total) by mouth every 12 (twelve) hours. 06/06/16   Pricilla Loveless, MD  predniSONE (DELTASONE) 20 MG tablet 2 tabs po daily x 4 days 06/07/16   Pricilla Loveless, MD    Family History No family history on file.  Social History Social History  Substance Use Topics  . Smoking status: Current Every Day Smoker  . Smokeless tobacco: Never Used  . Alcohol use Yes     Comment: occ     Allergies   Patient has no known allergies.   Review of Systems Review of Systems  Constitutional: Negative for chills and fever.  HENT: Negative for congestion and facial swelling.   Eyes: Negative for discharge and visual disturbance.  Respiratory: Positive for shortness of breath.   Cardiovascular: Negative for chest pain and palpitations.  Gastrointestinal: Negative for abdominal pain, diarrhea and vomiting.  Musculoskeletal: Negative for arthralgias and myalgias.  Skin: Negative for color change and rash.  Neurological: Negative for tremors, syncope and headaches.  Psychiatric/Behavioral: Negative for confusion and dysphoric mood.     Physical Exam Updated Vital Signs BP (!) 99/54 (BP Location: Right Arm)   Pulse 100   Temp 98.1 F (36.7 C) (Oral)   Resp 18   SpO2 94%   Physical Exam  Constitutional: He is oriented to person, place, and time. He appears  well-developed and well-nourished.  HENT:  Head: Normocephalic and atraumatic.  Eyes: EOM are normal. Pupils are equal, round, and reactive to light.  Neck: Normal range of motion. Neck supple. No JVD present.  Cardiovascular: Normal rate and regular rhythm.  Exam reveals no gallop and no friction rub.   No murmur heard. Pulmonary/Chest: No respiratory distress. He has no wheezes.  Abdominal: He exhibits no distension and no mass. There is no tenderness. There is no rebound and no guarding.  Musculoskeletal: Normal range of motion.  Neurological: He is alert and oriented to person, place, and time.  Skin: No rash noted. No pallor.  Psychiatric: He has a normal mood and affect. His behavior is normal.  Nursing note and vitals reviewed.    ED Treatments / Results  Labs (all labs ordered are listed, but only abnormal results are displayed) Labs Reviewed - No data to display  EKG  EKG Interpretation None       Radiology Dg Chest 2 View  Result Date: 06/27/2016 CLINICAL DATA:  Productive cough for 1 month. Shortness of breath. Generalized chest pain. Asthma attack today. Current smoker. EXAM: CHEST  2 VIEW COMPARISON:  06/20/2016 FINDINGS: Emphysematous changes and scattered fibrosis in the lungs. No focal airspace disease or consolidation. No blunting of costophrenic angles. No pneumothorax. Mediastinal contours appear intact. IMPRESSION: Emphysematous changes and scattered fibrosis in the lungs. No evidence of active disease. No significant change since prior study. Electronically Signed   By: Burman Nieves M.D.   On: 06/27/2016 22:20    Procedures Procedures (including critical care time)  Medications Ordered in ED Medications  albuterol (PROVENTIL HFA;VENTOLIN HFA) 108 (90 Base) MCG/ACT inhaler 6 puff (6 puffs Inhalation Given 06/27/16 2326)  dexamethasone (DECADRON) tablet 10 mg (10 mg Oral Given 06/27/16 2323)     Initial Impression / Assessment and Plan / ED Course  I  have reviewed the triage vital signs and the nursing notes.  Pertinent labs & imaging results that were available during my care of the patient were reviewed by me and considered in my medical decision making (see chart for details).     36 yo M with a cc of asthma exacerbation.  Improved with one neb.  Given inhaler, decadron.  Clear lungs on my exam.  D/c home.   12:53 AM: 3 I have discussed the diagnosis/risks/treatment options with the patient and family and believe the pt to be eligible for discharge home to follow-up with PCP. We also discussed returning to the ED immediately if new or worsening sx occur. We discussed the sx which are most concerning (e.g., sudden worsening pain, fever, inability to tolerate by mouth) that necessitate immediate return. Medications administered to the patient during their visit and any new prescriptions provided to the patient are listed below.  Medications given during this visit Medications  albuterol (PROVENTIL HFA;VENTOLIN HFA) 108 (90 Base) MCG/ACT inhaler 6 puff (6 puffs Inhalation Given 06/27/16 2326)  dexamethasone (DECADRON) tablet 10 mg (10 mg Oral Given 06/27/16 2323)     The patient appears reasonably screen and/or stabilized for discharge and I doubt  any other medical condition or other [facility] requiring further screening, evaluation, or treatment in the ED at this time prior to discharge.    Final Clinical Impressions(s) / ED Diagnoses   Final diagnoses:  Moderate persistent asthma with exacerbation    New Prescriptions Discharge Medication List as of 06/27/2016 11:02 PM       Melene Plan, DO 06/28/16 9604

## 2016-07-12 ENCOUNTER — Emergency Department (HOSPITAL_COMMUNITY)
Admission: EM | Admit: 2016-07-12 | Discharge: 2016-07-12 | Disposition: A | Discharge status: Home / Self Care | Payer: Medicaid Other | Attending: Emergency Medicine | Admitting: Emergency Medicine

## 2016-07-12 ENCOUNTER — Emergency Department (HOSPITAL_COMMUNITY): Payer: Medicaid Other

## 2016-07-12 ENCOUNTER — Encounter (HOSPITAL_COMMUNITY): Payer: Self-pay | Admitting: Emergency Medicine

## 2016-07-12 DIAGNOSIS — F17200 Nicotine dependence, unspecified, uncomplicated: Secondary | ICD-10-CM | POA: Diagnosis not present

## 2016-07-12 DIAGNOSIS — J4531 Mild persistent asthma with (acute) exacerbation: Secondary | ICD-10-CM

## 2016-07-12 DIAGNOSIS — R0602 Shortness of breath: Secondary | ICD-10-CM | POA: Diagnosis present

## 2016-07-12 MED ORDER — PREDNISONE 20 MG PO TABS: 20.0000 mg | ORAL_TABLET | Freq: Two times a day (BID) | ORAL | 0 refills | Status: AC

## 2016-07-12 MED ORDER — ALBUTEROL SULFATE HFA 108 (90 BASE) MCG/ACT IN AERS: 2.0000 | INHALATION_SPRAY | RESPIRATORY_TRACT | 0 refills | Status: DC | PRN

## 2016-07-12 MED ORDER — IPRATROPIUM-ALBUTEROL 0.5-2.5 (3) MG/3ML IN SOLN
3.0000 mL | Freq: Once | RESPIRATORY_TRACT | Status: AC
  Administered 2016-07-12: 3 mL via RESPIRATORY_TRACT
  Filled 2016-07-12: qty 3

## 2016-07-12 MED ORDER — ALBUTEROL SULFATE HFA 108 (90 BASE) MCG/ACT IN AERS
2.0000 | INHALATION_SPRAY | Freq: Once | RESPIRATORY_TRACT | Status: AC
  Administered 2016-07-12: 2 via RESPIRATORY_TRACT
  Filled 2016-07-12: qty 6.7

## 2016-07-12 MED ORDER — FLUTICASONE-SALMETEROL 250-50 MCG/DOSE IN AEPB: 1.0000 | INHALATION_SPRAY | Freq: Every day | RESPIRATORY_TRACT | 0 refills | Status: DC

## 2016-07-12 NOTE — ED Provider Notes (Signed)
[facility]ER[facility]Y DEPT Provider Note   CSN: [id number] Arrival date & time: 07/12/16  1540     History   Chief Complaint Chief Complaint  Patient presents with  . Shortness of Breath    HPI Joseph Huff is a 36 y.o. male who presents with progressively worsening SOB that began yesterday. Patient call EMS today when she felt like he was having difficulty talking because of symptoms. He received a duoneb and solumedrol by EMS en route and reports some mild improvement. He reports associated productive cough with yellow/dark brown sputum. He also reports some generalized cramps secondary to coughing. Patient has a history of asthma and states that symptoms feel similar to previous asthma attacks. He normally uses Albuterol and Advair but states that he has been out of them for several weeks. He reports that he has recently been working replacing floors and has been exposed to dust and black mold. He reports he was diagnosed with pneumonia several weeks ago and was prescribed antibiotics but was not able to fill the prescription. Patient denies any nausea, vomiting, diarrhea, CP. Patient does report smoking and states he has decreased to approximately 2 cigarettes/day.  The history is provided by the patient.    Past Medical History:  Diagnosis Date  . Asthma     There are no active problems to display for this patient.   No past surgical history on file.     Home Medications    Prior to Admission medications   Medication Sig Start Date End Date Taking? Authorizing Provider  albuterol (PROVENTIL HFA;VENTOLIN HFA) 108 (90 Base) MCG/ACT inhaler Inhale 2 puffs into the lungs every 4 (four) hours as needed for wheezing or shortness of breath. 07/12/16   Westley Foots, PA  Fluticasone-Salmeterol (ADVAIR DISKUS) 250-50 MCG/DOSE AEPB Inhale 1 puff into the lungs daily. 07/12/16   Westley Foots, PA  oseltamivir (TAMIFLU) 75 MG capsule Take 1 capsule (75 mg total) by mouth every 12  (twelve) hours. Patient not taking: Reported on 07/12/2016 06/06/16   Pricilla Loveless, MD  predniSONE (DELTASONE) 20 MG tablet Take 1 tablet (20 mg total) by mouth 2 (two) times daily. 07/12/16 07/16/16  Westley Foots, PA    Family History No family history on file.  Social History Social History  Substance Use Topics  . Smoking status: Current Every Day Smoker  . Smokeless tobacco: Never Used  . Alcohol use Yes     Comment: occ     Allergies   Patient has no known allergies.   Review of Systems Review of Systems  Constitutional: Positive for fever (subjective). Negative for chills.  HENT: Negative for congestion, rhinorrhea and sore throat.   Eyes: Negative for visual disturbance.  Respiratory: Positive for cough, chest tightness and shortness of breath.   Cardiovascular: Negative for chest pain.  Gastrointestinal: Negative for abdominal pain, blood in stool, diarrhea, nausea and vomiting.  Genitourinary: Negative for dysuria and hematuria.  Musculoskeletal: Negative for back pain and neck pain.  Skin: Negative for rash.  Neurological: Positive for weakness. Negative for dizziness, numbness and headaches.  Psychiatric/Behavioral: Negative for confusion.  All other systems reviewed and are negative.    Physical Exam Updated Vital Signs BP 126/76 (BP Location: Left Arm)   Pulse 83   Temp 97 F (36.1 C) (Axillary)   Resp 18   SpO2 97%   Physical Exam  Constitutional: He is oriented to person, place, and time. He appears well-developed and well-nourished.  HENT:  Head: Normocephalic  and atraumatic.  Eyes: Conjunctivae, EOM and lids are normal.  Neck: Full passive range of motion without pain.  Cardiovascular: Normal rate, regular rhythm, normal heart sounds and normal pulses.  Exam reveals no gallop and no friction rub.   No murmur heard. Pulmonary/Chest: Effort normal. No accessory muscle usage. No respiratory distress. He has wheezes.  Expiratory wheezing  throughout all lung fields but is moving air well.  Able to speak in full sentences.   Abdominal: Soft. There is no tenderness.  Musculoskeletal: Normal range of motion.  Neurological: He is alert and oriented to person, place, and time.  Skin: Skin is warm and dry. Capillary refill takes less than 2 seconds.  Psychiatric: He has a normal mood and affect. His speech is normal.  Nursing note and vitals reviewed.    ED Treatments / Results  Labs (all labs ordered are listed, but only abnormal results are displayed) Labs Reviewed - No data to display  EKG  EKG Interpretation None       Radiology Dg Chest 2 View  Result Date: 07/12/2016 CLINICAL DATA:  Shortness of breath, dust and multi exposure. Wheezing. History of asthma. EXAM: CHEST  2 VIEW COMPARISON:  Chest radiograph June 27, 2016 FINDINGS: Cardiomediastinal silhouette is normal. No pleural effusions or focal consolidations. Hyperinflation. Trachea projects midline and there is no pneumothorax. Soft tissue planes and included osseous structures are non-suspicious. IMPRESSION: Hyperinflation without focal consolidation. Electronically Signed   By: Awilda Metro M.D.   On: 07/12/2016 17:24    Procedures Procedures (including critical care time)  Medications Ordered in ED Medications  ipratropium-albuterol (DUONEB) 0.5-2.5 (3) MG/3ML nebulizer solution 3 mL (3 mLs Nebulization Given 07/12/16 1728)  albuterol (PROVENTIL HFA;VENTOLIN HFA) 108 (90 Base) MCG/ACT inhaler 2 puff (2 puffs Inhalation Given 07/12/16 1907)     Initial Impression / Assessment and Plan / ED Course  I have reviewed the triage vital signs and the nursing notes.  Pertinent labs & imaging results that were available during my care of the patient were reviewed by me and considered in my medical decision making (see chart for details).    36 yo PMH/o Asthma presents with SOB x 1 day. Out of Advair and albuterol. Recent exposure to dust/mold. Recently  diagnosed with pneumonia (beginning of March), but did not take antibiotics. Received neb x 1 and solumedrol en route with some improvement in symptoms. No respiratory distress on exam but with diffuse expiratory wheezing throughout. Concern for asthma exacerbation secondary to exposure and medication noncompliance vs pneumonia. Will check CXR to eval for infectious etiology. Currently, O2 sats 97% as he finishes up neb from EMS.   5:00 PM: Still with some expiratory wheezing after duoneb. Will start additional nebulizer   5:45 PM: Re-eval: Patient sleeping. Receiving 2nd duoneb. O2 sat 100%  6:19 PM: Re-eval: Wheezing significantly improved after 2nd duoneb.   Imaging reviewed. Negative for any focal consolidation. Symptoms likely a result of combination of asthma exacerbation, medication noncompliance, and dust/mold exposure.   7:19 PM: Patients O2 sats >95% RA after nebs. Stable for discharge at this time. Patient given 2puffs Albuterol inhaler in the ED. Plan to send him home with prescription for Albuterol, Prednisone, and Advair. Will also provide a clinic list that he can follow-up with on an outpatient basis since he has no PCP. Return precautions discussed. Patient expresses understanding and agreement to plan.   Final Clinical Impressions(s) / ED Diagnoses   Final diagnoses:  Mild persistent asthma with exacerbation  New Prescriptions New Prescriptions   ALBUTEROL (PROVENTIL HFA;VENTOLIN HFA) 108 (90 BASE) MCG/ACT INHALER    Inhale 2 puffs into the lungs every 4 (four) hours as needed for wheezing or shortness of breath.   FLUTICASONE-SALMETEROL (ADVAIR DISKUS) 250-50 MCG/DOSE AEPB    Inhale 1 puff into the lungs daily.   PREDNISONE (DELTASONE) 20 MG TABLET    Take 1 tablet (20 mg total) by mouth 2 (two) times daily.     Maxwell Caul, PA-C 07/13/16 1453    Derwood Kaplan, MD 07/13/16 [phone]

## 2016-07-12 NOTE — ED Notes (Signed)
Bed: RC78 Expected date:  Expected time:  Means of arrival:  Comments: EMS 36 y/o sob, asthma

## 2016-07-12 NOTE — Discharge Instructions (Signed)
Follow-up with one of the clinics listed below.  Take the medications as prescribed.   Return for any worsening difficulty breathing, fever, chest pain or any other concerning sypmtoms.       RESOURCE GUIDE  Dental Problems  Patients with Medicaid: [facility] [phone] [address]                                           [phone] [address] Phone:  [phone]                                                  Phone:  [phone]  If unable to pay or uninsured, contact:  [facility] or [facility]. to become qualified for the adult dental clinic.  Chronic Pain Problems Contact [facility]  [phone] Patients need to be referred by their primary care doctor.  Insufficient Money for Medicine Contact [employer]:  call "211" or [facility] [phone])[phone].  No Primary Care Doctor Call [facility]  [phone] Other agencies that provide inexpensive medical care    [facility]  [phone]    [facility]  [phone]    [facility]  3172605872    [facility]  [phone]    [facility]  [phone]    [facility]  6703309772  Psychological Services [facility]  [phone] [facility]  [phone] [facility]   [phone] (emergency services [phone])  Substance Abuse Resources [facility]  [phone] [facility] [phone] [facility] 858-046-8220 Floydene Flock [phone] Residential & Outpatient Substance Abuse Program  661-503-9463  Abuse/Neglect [facility] Child Abuse Hotline 434-610-6745 [facility] Child Abuse Hotline 972-110-1213 (After Hours)  Emergency Shelter [facility] (727)034-5367  Maternity Homes [facility] at the [place] [facility] [place] [phone] [facility] (440)[phone]  MRSA Hotline #:    510-128-6694    [facility] Resources  [facility]     [employer]                          [facility] Dept. [address] [place]                       [address]      [address]  [place]                                                Cristobal Goldmann Phone:  [phone]  Phone:  [phone]                 Phone:  [phone]  [facility] Phone:  [phone]  [facility] Child [facility] Hotline 571 161 [phone] (After Hours)

## 2016-07-12 NOTE — ED Triage Notes (Signed)
Per EMS: Pt was at work with a lot of dust and black mold.  SOB for past day.  Ran out of inhaler.  Wheezing in all fields.  No dizziness, NVD, or CP.  Has been coughing up black/green phlegm. Pt was given 10 mg albuterol, 0.5 atrovent.

## 2016-08-09 ENCOUNTER — Encounter (HOSPITAL_COMMUNITY): Payer: Self-pay

## 2016-08-09 ENCOUNTER — Emergency Department (HOSPITAL_COMMUNITY)
Admission: EM | Admit: 2016-08-09 | Discharge: 2016-08-09 | Disposition: A | Discharge status: Home / Self Care | Payer: Medicaid Other | Attending: Emergency Medicine | Admitting: Emergency Medicine

## 2016-08-09 ENCOUNTER — Emergency Department (HOSPITAL_COMMUNITY): Payer: Medicaid Other

## 2016-08-09 DIAGNOSIS — J4521 Mild intermittent asthma with (acute) exacerbation: Secondary | ICD-10-CM

## 2016-08-09 DIAGNOSIS — Z79899 Other long term (current) drug therapy: Secondary | ICD-10-CM | POA: Diagnosis not present

## 2016-08-09 DIAGNOSIS — F17200 Nicotine dependence, unspecified, uncomplicated: Secondary | ICD-10-CM | POA: Diagnosis not present

## 2016-08-09 DIAGNOSIS — J45909 Unspecified asthma, uncomplicated: Secondary | ICD-10-CM | POA: Diagnosis present

## 2016-08-09 MED ORDER — ALBUTEROL SULFATE HFA 108 (90 BASE) MCG/ACT IN AERS
2.0000 | INHALATION_SPRAY | Freq: Once | RESPIRATORY_TRACT | Status: AC
  Administered 2016-08-09: 2 via RESPIRATORY_TRACT
  Filled 2016-08-09: qty 6.7

## 2016-08-09 MED ORDER — DEXAMETHASONE 4 MG PO TABS
10.0000 mg | ORAL_TABLET | Freq: Once | ORAL | Status: AC
  Administered 2016-08-09: 23:00:00 10 mg via ORAL
  Filled 2016-08-09: qty 2

## 2016-08-09 NOTE — ED Provider Notes (Signed)
[facility]-EMERGENCY DEPT Provider Note   CSN: [id number] Arrival date & time: 08/09/16  2004     History   Chief Complaint Chief Complaint  Patient presents with  . Asthma    HPI Joseph Huff is a 36 y.o. male.  36 yo M with a chief complaint of an asthma exacerbation. Going on for the past couple days. Increased cough and wheezing. Has run out of his home medications. Denies fevers or chills. Was given a breathing treatment on in triage with significant improvement of his symptoms.   The history is provided by the patient.  Asthma  This is a recurrent problem. The current episode started 2 days ago. The problem occurs constantly. The problem has not changed since onset.Associated symptoms include shortness of breath. Pertinent negatives include no chest pain, no abdominal pain and no headaches. Nothing aggravates the symptoms. Nothing relieves the symptoms. He has tried nothing for the symptoms. The treatment provided no relief.    Past Medical History:  Diagnosis Date  . Asthma     There are no active problems to display for this patient.   History reviewed. No pertinent surgical history.     Home Medications    Prior to Admission medications   Medication Sig Start Date End Date Taking? Authorizing Provider  albuterol (PROVENTIL HFA;VENTOLIN HFA) 108 (90 Base) MCG/ACT inhaler Inhale 2 puffs into the lungs every 4 (four) hours as needed for wheezing or shortness of breath. Patient not taking: Reported on 08/09/2016 07/12/16   Maxwell Caul, PA-C  Fluticasone-Salmeterol (ADVAIR DISKUS) 250-50 MCG/DOSE AEPB Inhale 1 puff into the lungs daily. Patient not taking: Reported on 08/09/2016 07/12/16   Maxwell Caul, PA-C    Family History History reviewed. No pertinent family history.  Social History Social History  Substance Use Topics  . Smoking status: Current Every Day Smoker  . Smokeless tobacco: Never Used  . Alcohol use Yes     Comment: occ     Allergies     Patient has no known allergies.   Review of Systems Review of Systems  Constitutional: Negative for chills and fever.  HENT: Negative for congestion and facial swelling.   Eyes: Negative for discharge and visual disturbance.  Respiratory: Positive for cough, shortness of breath and wheezing.   Cardiovascular: Negative for chest pain and palpitations.  Gastrointestinal: Negative for abdominal pain, diarrhea and vomiting.  Musculoskeletal: Negative for arthralgias and myalgias.  Skin: Negative for color change and rash.  Neurological: Negative for tremors, syncope and headaches.  Psychiatric/Behavioral: Negative for confusion and dysphoric mood.     Physical Exam Updated Vital Signs BP 117/83 (BP Location: Right Arm)   Pulse (!) 101   Temp 97.8 F (36.6 C) (Oral)   Resp 20   SpO2 95%   Physical Exam  Constitutional: He is oriented to person, place, and time. He appears well-developed and well-nourished.  HENT:  Head: Normocephalic and atraumatic.  Eyes: EOM are normal. Pupils are equal, round, and reactive to light.  Neck: Normal range of motion. Neck supple. No JVD present.  Cardiovascular: Normal rate and regular rhythm.  Exam reveals no gallop and no friction rub.   No murmur heard. Pulmonary/Chest: No respiratory distress. He has no wheezes.  Abdominal: He exhibits no distension. There is no rebound and no guarding.  Musculoskeletal: Normal range of motion.  Neurological: He is alert and oriented to person, place, and time.  Skin: No rash noted. No pallor.  Psychiatric: He has a normal mood  and affect. His behavior is normal.  Nursing note and vitals reviewed.    ED Treatments / Results  Labs (all labs ordered are listed, but only abnormal results are displayed) Labs Reviewed - No data to display  EKG  EKG Interpretation None       Radiology Dg Chest 2 View  Result Date: 08/09/2016 CLINICAL DATA:  Worsening asthma chest tightness EXAM: CHEST  2 VIEW  COMPARISON:  07/12/2016 FINDINGS: The lungs are hyperinflated. No acute pulmonary infiltrate or effusion. Normal cardiomediastinal silhouette. No pneumothorax. IMPRESSION: Hyperinflation without focal infiltrate or edema Electronically Signed   By: Jasmine Pang M.D.   On: 08/09/2016 21:01    Procedures Procedures (including critical care time)  Medications Ordered in ED Medications  dexamethasone (DECADRON) tablet 10 mg (not administered)  albuterol (PROVENTIL HFA;VENTOLIN HFA) 108 (90 Base) MCG/ACT inhaler 2 puff (not administered)     Initial Impression / Assessment and Plan / ED Course  I have reviewed the triage vital signs and the nursing notes.  Pertinent labs & imaging results that were available during my care of the patient were reviewed by me and considered in my medical decision making (see chart for details).     36 yo M With a chief complaint of an asthma exacerbation. Clear lungs on my exam. Will give a dose of Decadron. Given an albuterol inhaler. Discharge home.  10:30 PM:  I have discussed the diagnosis/risks/treatment options with the patient and believe the pt to be eligible for discharge home to follow-up with PCP. We also discussed returning to the ED immediately if new or worsening sx occur. We discussed the sx which are most concerning (e.g.need to use inhaler more often than every 4 hours, sudden worsening sob, fever) that necessitate immediate return. Medications administered to the patient during their visit and any new prescriptions provided to the patient are listed below.  Medications given during this visit Medications  dexamethasone (DECADRON) tablet 10 mg (not administered)  albuterol (PROVENTIL HFA;VENTOLIN HFA) 108 (90 Base) MCG/ACT inhaler 2 puff (not administered)     The patient appears reasonably screen and/or stabilized for discharge and I doubt any other medical condition or other [facility] requiring further screening, evaluation, or treatment in the  ED at this time prior to discharge.    Final Clinical Impressions(s) / ED Diagnoses   Final diagnoses:  Mild intermittent asthma with exacerbation    New Prescriptions New Prescriptions   No medications on file     Melene Plan, DO 08/09/16 2230

## 2016-08-09 NOTE — ED Triage Notes (Signed)
Pt is homeless and doesn't have any way to get his medications or treatments, he works and his hours interfere with him being able to use those resources Pt was given 2 duonebs, solumedrol and 5mg  albuterol in route with EMS

## 2016-08-09 NOTE — Discharge Instructions (Signed)
Use your inhaler every 4 hours(6 puffs) while awake, return for sudden worsening shortness of breath, or if you need to use your inhaler more often.  ° °

## 2016-08-09 NOTE — ED Triage Notes (Signed)
Pt states he feels better after the meds from EMS but he still cant take a deep breath Pt needs and inhaler to have with him at discharge

## 2016-08-12 ENCOUNTER — Emergency Department (HOSPITAL_COMMUNITY): Payer: Medicaid Other | Admitting: Anesthesiology

## 2016-08-12 ENCOUNTER — Observation Stay (HOSPITAL_COMMUNITY)
Admission: EM | Admit: 2016-08-12 | Discharge: 2016-08-13 | Disposition: A | Discharge status: Home / Self Care | Payer: Medicaid Other | Attending: Otolaryngology | Admitting: Otolaryngology

## 2016-08-12 ENCOUNTER — Emergency Department (HOSPITAL_COMMUNITY): Payer: Medicaid Other

## 2016-08-12 ENCOUNTER — Encounter (HOSPITAL_COMMUNITY)
Admission: EM | Disposition: A | Discharge status: Home / Self Care | Payer: Self-pay | Source: Home / Self Care | Attending: Emergency Medicine

## 2016-08-12 ENCOUNTER — Encounter (HOSPITAL_COMMUNITY): Payer: Self-pay | Admitting: Emergency Medicine

## 2016-08-12 DIAGNOSIS — R1310 Dysphagia, unspecified: Secondary | ICD-10-CM | POA: Diagnosis not present

## 2016-08-12 DIAGNOSIS — J45909 Unspecified asthma, uncomplicated: Secondary | ICD-10-CM | POA: Diagnosis not present

## 2016-08-12 DIAGNOSIS — T17208A Unspecified foreign body in pharynx causing other injury, initial encounter: Secondary | ICD-10-CM | POA: Diagnosis present

## 2016-08-12 DIAGNOSIS — Z419 Encounter for procedure for purposes other than remedying health state, unspecified: Secondary | ICD-10-CM

## 2016-08-12 DIAGNOSIS — T189XXA Foreign body of alimentary tract, part unspecified, initial encounter: Secondary | ICD-10-CM | POA: Diagnosis not present

## 2016-08-12 DIAGNOSIS — X58XXXA Exposure to other specified factors, initial encounter: Secondary | ICD-10-CM | POA: Diagnosis not present

## 2016-08-12 DIAGNOSIS — Z87891 Personal history of nicotine dependence: Secondary | ICD-10-CM | POA: Diagnosis not present

## 2016-08-12 DIAGNOSIS — R070 Pain in throat: Secondary | ICD-10-CM | POA: Diagnosis not present

## 2016-08-12 HISTORY — PX: DIRECT LARYNGOSCOPY: SHX5326

## 2016-08-12 LAB — I-STAT CHEM 8, ED
BUN: 17 mg/dL (ref 6–20)
CALCIUM ION: 1.12 mmol/L — AB (ref 1.15–1.40)
CHLORIDE: 104 mmol/L (ref 101–111)
Creatinine, Ser: 1.1 mg/dL (ref 0.61–1.24)
GLUCOSE: 76 mg/dL (ref 65–99)
HCT: 47 % (ref 39.0–52.0)
HEMOGLOBIN: 16 g/dL (ref 13.0–17.0)
POTASSIUM: 3.7 mmol/L (ref 3.5–5.1)
Sodium: 140 mmol/L (ref 135–145)
TCO2: 26 mmol/L (ref 0–100)

## 2016-08-12 SURGERY — LARYNGOSCOPY, DIRECT
Anesthesia: General

## 2016-08-12 MED ORDER — LIDOCAINE 2% (20 MG/ML) 5 ML SYRINGE
INTRAMUSCULAR | Status: AC
  Filled 2016-08-12: qty 5

## 2016-08-12 MED ORDER — ONDANSETRON HCL 4 MG/2ML IJ SOLN
INTRAMUSCULAR | Status: AC
  Filled 2016-08-12: qty 2

## 2016-08-12 MED ORDER — NEOSTIGMINE METHYLSULFATE 5 MG/5ML IV SOSY
PREFILLED_SYRINGE | INTRAVENOUS | Status: AC
  Filled 2016-08-12: qty 5

## 2016-08-12 MED ORDER — LIDOCAINE VISCOUS 2 % MT SOLN: 10.0000 mL | OROMUCOSAL | 0 refills | Status: DC | PRN

## 2016-08-12 MED ORDER — LACTATED RINGERS IV SOLN
INTRAVENOUS | Status: DC | PRN
  Administered 2016-08-12 (×2): via INTRAVENOUS

## 2016-08-12 MED ORDER — PROPOFOL 10 MG/ML IV BOLUS
INTRAVENOUS | Status: DC | PRN
  Administered 2016-08-12: 160 mg via INTRAVENOUS

## 2016-08-12 MED ORDER — DEXAMETHASONE SODIUM PHOSPHATE 10 MG/ML IJ SOLN
10.0000 mg | Freq: Once | INTRAMUSCULAR | Status: AC
  Administered 2016-08-12: 10 mg via INTRAVENOUS
  Filled 2016-08-12: qty 1

## 2016-08-12 MED ORDER — FENTANYL CITRATE (PF) 100 MCG/2ML IJ SOLN: 25.0000 ug | INTRAMUSCULAR | Status: DC | PRN

## 2016-08-12 MED ORDER — OXYCODONE HCL 5 MG/5ML PO SOLN: 5.0000 mg | Freq: Once | ORAL | Status: DC | PRN

## 2016-08-12 MED ORDER — PHENYLEPHRINE HCL 10 MG/ML IJ SOLN
INTRAMUSCULAR | Status: DC | PRN
  Administered 2016-08-12: 80 ug via INTRAVENOUS

## 2016-08-12 MED ORDER — SUGAMMADEX SODIUM 200 MG/2ML IV SOLN
INTRAVENOUS | Status: AC
  Filled 2016-08-12: qty 2

## 2016-08-12 MED ORDER — MIDAZOLAM HCL 2 MG/2ML IJ SOLN
INTRAMUSCULAR | Status: AC
  Filled 2016-08-12: qty 2

## 2016-08-12 MED ORDER — SUCCINYLCHOLINE CHLORIDE 20 MG/ML IJ SOLN
INTRAMUSCULAR | Status: DC | PRN
  Administered 2016-08-12: 60 mg via INTRAVENOUS

## 2016-08-12 MED ORDER — CEFAZOLIN SODIUM-DEXTROSE 2-4 GM/100ML-% IV SOLN
2000.0000 mg | Freq: Once | INTRAVENOUS | Status: AC
  Administered 2016-08-12: 2 g via INTRAVENOUS
  Filled 2016-08-12: qty 100

## 2016-08-12 MED ORDER — HYDROCODONE-ACETAMINOPHEN 7.5-325 MG/15ML PO SOLN: 10.0000 mL | ORAL | 0 refills | Status: DC | PRN

## 2016-08-12 MED ORDER — LIDOCAINE HCL (CARDIAC) 20 MG/ML IV SOLN
INTRAVENOUS | Status: DC | PRN
  Administered 2016-08-12: 60 mg via INTRATRACHEAL

## 2016-08-12 MED ORDER — PHENYLEPHRINE 40 MCG/ML (10ML) SYRINGE FOR IV PUSH (FOR BLOOD PRESSURE SUPPORT)
PREFILLED_SYRINGE | INTRAVENOUS | Status: AC
  Filled 2016-08-12: qty 10

## 2016-08-12 MED ORDER — DEXTROSE IN LACTATED RINGERS 5 % IV SOLN
INTRAVENOUS | Status: DC
  Administered 2016-08-12: 14:00:00 via INTRAVENOUS

## 2016-08-12 MED ORDER — OXYCODONE HCL 5 MG PO TABS: 5.0000 mg | ORAL_TABLET | Freq: Once | ORAL | Status: DC | PRN

## 2016-08-12 MED ORDER — DEXAMETHASONE SODIUM PHOSPHATE 10 MG/ML IJ SOLN
10.0000 mg | Freq: Three times a day (TID) | INTRAMUSCULAR | Status: AC
  Administered 2016-08-12 (×2): 10 mg via INTRAVENOUS
  Filled 2016-08-12 (×2): qty 1

## 2016-08-12 MED ORDER — CEFAZOLIN SODIUM-DEXTROSE 2-4 GM/100ML-% IV SOLN
2.0000 g | Freq: Three times a day (TID) | INTRAVENOUS | Status: DC
  Administered 2016-08-12 – 2016-08-13 (×3): 2 g via INTRAVENOUS
  Filled 2016-08-12 (×5): qty 100

## 2016-08-12 MED ORDER — ALBUTEROL SULFATE (2.5 MG/3ML) 0.083% IN NEBU
2.5000 mg | INHALATION_SOLUTION | RESPIRATORY_TRACT | Status: DC | PRN
  Administered 2016-08-13: 2.5 mg via RESPIRATORY_TRACT
  Filled 2016-08-12: qty 3

## 2016-08-12 MED ORDER — ROCURONIUM BROMIDE 100 MG/10ML IV SOLN
INTRAVENOUS | Status: DC | PRN
  Administered 2016-08-12: 25 mg via INTRAVENOUS

## 2016-08-12 MED ORDER — SUCCINYLCHOLINE CHLORIDE 200 MG/10ML IV SOSY
PREFILLED_SYRINGE | INTRAVENOUS | Status: AC
  Filled 2016-08-12: qty 10

## 2016-08-12 MED ORDER — DEXAMETHASONE SODIUM PHOSPHATE 10 MG/ML IJ SOLN
INTRAMUSCULAR | Status: AC
  Filled 2016-08-12: qty 1

## 2016-08-12 MED ORDER — ONDANSETRON HCL 4 MG/2ML IJ SOLN: 4.0000 mg | INTRAMUSCULAR | Status: DC | PRN

## 2016-08-12 MED ORDER — IBUPROFEN 100 MG/5ML PO SUSP: 400.0000 mg | Freq: Four times a day (QID) | ORAL | Status: DC | PRN

## 2016-08-12 MED ORDER — MORPHINE SULFATE (PF) 4 MG/ML IV SOLN: 2.0000 mg | INTRAVENOUS | Status: DC | PRN

## 2016-08-12 MED ORDER — SUGAMMADEX SODIUM 200 MG/2ML IV SOLN
INTRAVENOUS | Status: DC | PRN
  Administered 2016-08-12: 200 mg via INTRAVENOUS

## 2016-08-12 MED ORDER — CEFAZOLIN SODIUM-DEXTROSE 2-4 GM/100ML-% IV SOLN
INTRAVENOUS | Status: AC
  Filled 2016-08-12: qty 100

## 2016-08-12 MED ORDER — FENTANYL CITRATE (PF) 250 MCG/5ML IJ SOLN
INTRAMUSCULAR | Status: AC
  Filled 2016-08-12: qty 5

## 2016-08-12 MED ORDER — HYDROCODONE-ACETAMINOPHEN 7.5-325 MG/15ML PO SOLN
10.0000 mL | ORAL | Status: DC | PRN
  Administered 2016-08-12: 15 mL via ORAL
  Filled 2016-08-12: qty 15

## 2016-08-12 MED ORDER — ROCURONIUM BROMIDE 10 MG/ML (PF) SYRINGE
PREFILLED_SYRINGE | INTRAVENOUS | Status: AC
  Filled 2016-08-12: qty 5

## 2016-08-12 MED ORDER — ONDANSETRON HCL 4 MG PO TABS: 4.0000 mg | ORAL_TABLET | ORAL | Status: DC | PRN

## 2016-08-12 MED ORDER — ONDANSETRON HCL 4 MG/2ML IJ SOLN
INTRAMUSCULAR | Status: DC | PRN
  Administered 2016-08-12: 4 mg via INTRAVENOUS

## 2016-08-12 MED ORDER — PROPOFOL 10 MG/ML IV BOLUS
INTRAVENOUS | Status: AC
  Filled 2016-08-12: qty 20

## 2016-08-12 SURGICAL SUPPLY — 23 items
BALLN PULM 15 16.5 18 X 75CM (BALLOONS)
BALLN PULM 15 16.5 18X75 (BALLOONS)
BALLOON PULM 15 16.5 18X75 (BALLOONS) IMPLANT
CANISTER SUCT 3000ML PPV (MISCELLANEOUS) ×3 IMPLANT
CONT SPEC 4OZ CLIKSEAL STRL BL (MISCELLANEOUS) ×3 IMPLANT
COVER BACK TABLE 60X90IN (DRAPES) ×3 IMPLANT
COVER MAYO STAND STRL (DRAPES) ×3 IMPLANT
DRAPE HALF SHEET 40X57 (DRAPES) ×3 IMPLANT
GAUZE SPONGE 4X4 16PLY XRAY LF (GAUZE/BANDAGES/DRESSINGS) ×3 IMPLANT
GLOVE BIOGEL M 7.0 STRL (GLOVE) ×3 IMPLANT
GUARD TEETH (MISCELLANEOUS) IMPLANT
KIT BASIN OR (CUSTOM PROCEDURE TRAY) ×3 IMPLANT
KIT ROOM TURNOVER OR (KITS) ×3 IMPLANT
NEEDLE 18GX1X1/2 (RX/OR ONLY) (NEEDLE) IMPLANT
NEEDLE HYPO 25GX1X1/2 BEV (NEEDLE) IMPLANT
NS IRRIG 1000ML POUR BTL (IV SOLUTION) ×3 IMPLANT
PAD ARMBOARD 7.5X6 YLW CONV (MISCELLANEOUS) ×6 IMPLANT
PATTIES SURGICAL .5 X3 (DISPOSABLE) IMPLANT
SURGILUBE 2OZ TUBE FLIPTOP (MISCELLANEOUS) ×3 IMPLANT
TOWEL OR 17X24 6PK STRL BLUE (TOWEL DISPOSABLE) ×6 IMPLANT
TUBE CONNECTING 12'X1/4 (SUCTIONS) ×1
TUBE CONNECTING 12X1/4 (SUCTIONS) ×2 IMPLANT
WATER STERILE IRR 1000ML POUR (IV SOLUTION) IMPLANT

## 2016-08-12 NOTE — Consult Note (Signed)
Reason for Consult: Foreign Body Referring Physician: EDP  Joseph Huff is an 36 y.o. male.  HPI: Pt presents to ER with FB sensation. No prior hx. Pain on swallowing  Past Medical History:  Diagnosis Date  . Asthma     History reviewed. No pertinent surgical history.  No family history on file.  Social History:  reports that he quit smoking 11 days ago. His smoking use included Cigarettes. He has never used smokeless tobacco. He reports that he uses drugs, including Marijuana. He reports that he does not drink alcohol.  Allergies: No Known Allergies  Medications: I have reviewed the patient's current medications.  Results for orders placed or performed during the hospital encounter of 08/12/16 (from the past 48 hour(s))  I-stat chem 8, ed     Status: Abnormal   Collection Time: 08/12/16  7:01 AM  Result Value Ref Range   Sodium 140 135 - 145 mmol/L   Potassium 3.7 3.5 - 5.1 mmol/L   Chloride 104 101 - 111 mmol/L   BUN 17 6 - 20 mg/dL   Creatinine, Ser 0.07 0.61 - 1.24 mg/dL   Glucose, Bld 76 65 - 99 mg/dL   Calcium, Ion 6.22 (L) 1.15 - 1.40 mmol/L   TCO2 26 0 - 100 mmol/L   Hemoglobin 16.0 13.0 - 17.0 g/dL   HCT 63.3 35.4 - 56.2 %    Dg Neck Soft Tissue  Result Date: 08/12/2016 CLINICAL DATA:  Swallowed a piece of metal a few hour ago. LEFT jaw pain. EXAM: NECK SOFT TISSUES - 1+ VIEW COMPARISON:  None. FINDINGS: Subcentimeter metallic foreign body projects at the level of the LEFT piriform sinus. There is no evidence of retropharyngeal soft tissue swelling or epiglottic enlargement. The cervical airway is unremarkable. IMPRESSION: Metallic foreign body within LEFT piriform sinus region. Patent airway. Electronically Signed   By: Awilda Metro M.D.   On: 08/12/2016 06:48   Dg Chest 1 View  Result Date: 08/12/2016 CLINICAL DATA:  Swallowed metal object a few hours ago.  Hemoptysis. EXAM: CHEST 1 VIEW COMPARISON:  Chest radiograph August 09, 2016 FINDINGS: Cardiomediastinal  silhouette is normal. No pleural effusions or focal consolidations. Hyperinflation . Trachea projects midline and there is no pneumothorax. Soft tissue planes and included osseous structures are non-suspicious. Subcentimeter radiopaque foreign body projecting in LEFT neck. IMPRESSION: Hyperinflation without focal consolidation. Metallic foreign body in LEFT neck. Electronically Signed   By: Awilda Metro M.D.   On: 08/12/2016 06:49   Dg Abdomen 1 View  Result Date: 08/12/2016 CLINICAL DATA:  Accidentally swallowed metal object a few hours ago. Nausea and vomiting. EXAM: ABDOMEN - 1 VIEW COMPARISON:  None. FINDINGS: The bowel gas pattern is normal. Stool distended rectum. No radio-opaque calculi or other significant radiographic abnormality are seen. IMPRESSION: Stool distended rectum without bowel obstruction. No radiopaque foreign bodies. Electronically Signed   By: Awilda Metro M.D.   On: 08/12/2016 06:50    Review of Systems  Constitutional: Negative.   HENT: Positive for sore throat.   Respiratory: Negative.   Cardiovascular: Negative.    Blood pressure 109/69, pulse 80, temperature 98 F (36.7 C), temperature source Oral, resp. rate 16, height 6' (1.829 m), weight 84.8 kg (187 lb), SpO2 91 %. Physical Exam  Constitutional: He appears well-developed and well-nourished.  HENT:  Normal exam   Neck: Normal range of motion. Neck supple.  Cardiovascular: Normal rate.   Respiratory: Effort normal.    Assessment/Plan: Adm for DL and removal of FB.  Latissa Frick 08/12/2016, 9:08 AM

## 2016-08-12 NOTE — ED Triage Notes (Signed)
Pt reports he swallowed a piece of metal accidentally.  States he feels like it is still there, causing him a 5/10 pain.  No trouble breathing or speaking

## 2016-08-12 NOTE — Anesthesia Preprocedure Evaluation (Signed)
Anesthesia Evaluation  Patient identified by MRN, date of birth, ID band Patient awake    Reviewed: Allergy & Precautions, NPO status , Patient's Chart, lab work & pertinent test results  Airway Mallampati: II  TM Distance: >3 FB Neck ROM: Full    Dental  (+) Teeth Intact,    Pulmonary asthma , Current Smoker,    breath sounds clear to auscultation       Cardiovascular negative cardio ROS   Rhythm:Regular     Neuro/Psych negative neurological ROS  negative psych ROS   GI/Hepatic negative GI ROS, (+)     substance abuse  marijuana use,   Endo/Other  negative endocrine ROS  Renal/GU negative Renal ROS  negative genitourinary   Musculoskeletal negative musculoskeletal ROS (+)   Abdominal   Peds negative pediatric ROS (+)  Hematology negative hematology ROS (+)   Anesthesia Other Findings   Reproductive/Obstetrics negative OB ROS                             Anesthesia Physical Anesthesia Plan  ASA: II and emergent  Anesthesia Plan: General   Post-op Pain Management:    Induction: Intravenous and Rapid sequence  Airway Management Planned: Oral ETT  Additional Equipment: None  Intra-op Plan:   Post-operative Plan: Extubation in OR  Informed Consent: I have reviewed the patients History and Physical, chart, labs and discussed the procedure including the risks, benefits and alternatives for the proposed anesthesia with the patient or authorized representative who has indicated his/her understanding and acceptance.   Dental advisory given  Plan Discussed with: CRNA and Surgeon  Anesthesia Plan Comments:         Anesthesia Quick Evaluation

## 2016-08-12 NOTE — Anesthesia Procedure Notes (Signed)
Procedure Name: Intubation Date/Time: 08/12/2016 9:28 AM Performed by: Marena Chancy Pre-anesthesia Checklist: Patient identified, Emergency Drugs available, Suction available and Patient being monitored Patient Re-evaluated:Patient Re-evaluated prior to inductionOxygen Delivery Method: Circle System Utilized Preoxygenation: Pre-oxygenation with 100% oxygen Intubation Type: IV induction Ventilation: Mask ventilation without difficulty Laryngoscope Size: Miller and 3 Grade View: Grade I Tube type: Oral Number of attempts: 1 Airway Equipment and Method: Stylet and Oral airway Placement Confirmation: ETT inserted through vocal cords under direct vision,  positive ETCO2 and breath sounds checked- equal and bilateral Tube secured with: Tape Dental Injury: Teeth and Oropharynx as per pre-operative assessment

## 2016-08-12 NOTE — Brief Op Note (Signed)
08/12/2016  10:35 AM  PATIENT:  Joseph Huff  36 y.o. male  PRE-OPERATIVE DIAGNOSIS:  FOREIGN BODY   POST-OPERATIVE DIAGNOSIS:  DYSPHAGIA  PROCEDURE:  Procedure(s): LARYNGOSCOPY AND CERVICAL ESOPHOGOSCOPY (N/A)  SURGEON:  Surgeon(s) and Role:    * Osborn Coho, MD - Primary  PHYSICIAN ASSISTANT:   ASSISTANTS: none   ANESTHESIA:   general  EBL:  Total I/O In: 1000 [I.V.:1000] Out: 1 [Blood:1]  BLOOD ADMINISTERED:none  DRAINS: none   LOCAL MEDICATIONS USED:  NONE  SPECIMEN:  No Specimen  DISPOSITION OF SPECIMEN:  N/A  COUNTS:  YES  TOURNIQUET:  * No tourniquets in log *  DICTATION: .Other Dictation: Dictation Number [id number]  PLAN OF CARE: Discharge to home after PACU  PATIENT DISPOSITION:  PACU - hemodynamically stable.   Delay start of Pharmacological VTE agent (>24hrs) due to surgical blood loss or risk of bleeding: not applicable

## 2016-08-12 NOTE — ED Provider Notes (Signed)
[facility]-EMERGENCY DEPT Provider Note   CSN: [id number] Arrival date & time: 08/12/16  0531     History   Chief Complaint Chief Complaint  Patient presents with  . Swallowed Foreign Body    HPI Joseph Huff is a 36 y.o. male.  The history is provided by the patient.  Swallowed Foreign Body  The current episode started 1 to 2 hours ago. The problem occurs constantly. The problem has not changed since onset.Pertinent negatives include no chest pain and no shortness of breath. The symptoms are aggravated by swallowing. The symptoms are relieved by rest.   Pt reports he swallowed a small metal piece from a bong prior to arrival He reports since that time he has coughed up small amt of blood and difficulty swallowing No other pain complaints He reports it feels "stuck" in his throat  Past Medical History:  Diagnosis Date  . Asthma     There are no active problems to display for this patient.   History reviewed. No pertinent surgical history.     Home Medications    Prior to Admission medications   Medication Sig Start Date End Date Taking? Authorizing Provider  albuterol (PROVENTIL HFA;VENTOLIN HFA) 108 (90 Base) MCG/ACT inhaler Inhale 2 puffs into the lungs every 4 (four) hours as needed for wheezing or shortness of breath. Patient not taking: Reported on 08/09/2016 07/12/16   Maxwell Caul, PA-C  Fluticasone-Salmeterol (ADVAIR DISKUS) 250-50 MCG/DOSE AEPB Inhale 1 puff into the lungs daily. Patient not taking: Reported on 08/09/2016 07/12/16   Maxwell Caul, PA-C    Family History No family history on file.  Social History Social History  Substance Use Topics  . Smoking status: Former Smoker    Types: Cigarettes    Quit date: 08/01/2016  . Smokeless tobacco: Never Used  . Alcohol use No     Comment: occ     Allergies   Patient has no known allergies.   Review of Systems Review of Systems  Constitutional: Negative for fever.  HENT:       Odynophagia    Respiratory: Positive for cough. Negative for shortness of breath.   Cardiovascular: Negative for chest pain.  All other systems reviewed and are negative.    Physical Exam Updated Vital Signs BP (!) 138/113 (BP Location: Right Arm)   Pulse 98   Temp 98 F (36.7 C) (Oral)   Resp 18   Ht 6' (1.829 m)   Wt 84.8 kg   SpO2 92%   BMI 25.36 kg/m   Physical Exam  CONSTITUTIONAL: Disheveled, smells of marijuana HEAD: Normocephalic/atraumatic EYES: EOMI/PERRL ENMT: Mucous membranes moist, uvula midline, no foreign bodies noted, no drooling noted, no stridor, normal phonation NECK: supple no meningeal signs, no neck crepitus noted SPINE/BACK:entire spine nontender CV: S1/S2 noted, no murmurs/rubs/gallops noted LUNGS: scattered wheezing noted, no distress noted ABDOMEN: soft, nontender NEURO: Pt is awake/alert/appropriate, moves all extremitiesx4.  No facial droop.   EXTREMITIES: pulses normal/equal, full ROM SKIN: warm, color normal PSYCH: no abnormalities of mood noted, alert and oriented to situation  ED Treatments / Results  Labs (all labs ordered are listed, but only abnormal results are displayed) Labs Reviewed - No data to display  EKG  EKG Interpretation None       Radiology No results found.  Procedures Procedures (including critical care time)  Medications Ordered in ED Medications - No data to display   Initial Impression / Assessment and Plan / ED Course  I have reviewed  the triage vital signs and the nursing notes.  Pertinent  imaging results that were available during my care of the patient were reviewed by me and considered in my medical decision making (see chart for details).     Will start with xrays and reassess 7:21 AM Xray findings noted Due to location of metallic foreign body, I have consulted ENT D/w dr Annalee Genta He will take patient to the OR for removal  Pt updated on plan No distress No stridor  Pt has been NPO He denies  any other pain complaints - denies CP/abd pain  Final Clinical Impressions(s) / ED Diagnoses   Final diagnoses:  Swallowed foreign body, initial encounter    New Prescriptions New Prescriptions   No medications on file     Zadie Rhine, MD 08/12/16 [id number]

## 2016-08-12 NOTE — Progress Notes (Signed)
Pt arrived from PACU via bed with RN. Pt drowsy but easily aroused by voice. Assessment as noted. Skin intact but pt generally unkept as evidenced by body odor and dirt. POC discussed. Pt v/u and agreement. Continuous pulse ox initiated. O2 at [facility] continued.  Bed low and locked with call bell in reach.

## 2016-08-12 NOTE — Transfer of Care (Signed)
Immediate Anesthesia Transfer of Care Note  Patient: Joseph Huff  Procedure(s) Performed: Procedure(s): LARYNGOSCOPY AND CERVICAL ESOPHOGOSCOPY (N/A)  Patient Location: PACU  Anesthesia Type:General  Level of Consciousness: awake, alert  and oriented  Airway & Oxygen Therapy: Patient Spontanous Breathing and Patient connected to nasal cannula oxygen  Post-op Assessment: Report given to RN, Post -op Vital signs reviewed and stable and Patient moving all extremities X 4  Post vital signs: Reviewed and stable  Last Vitals:  Vitals:   08/12/16 0818 08/12/16 1037  BP:  125/77  Pulse: 80 (!) 102  Resp: 16 18  Temp:  36.5 C    Last Pain:  Vitals:   08/12/16 1037  TempSrc:   PainSc: (P) Asleep         Complications: No apparent anesthesia complications

## 2016-08-12 NOTE — Progress Notes (Signed)
F/U call from Dr Annalee Genta at 1155 stating "he was enroute to hospital to write obs orders for pt"

## 2016-08-12 NOTE — Progress Notes (Signed)
First page to Dr Annalee Genta at approx 1050- pager not in service- follow up call to answering service, obtained Dr Norville Haggard cell- page to Hospitalist at [phone] - no call back - Dr Maple Hudson updated at 1115 about pt homeless status & no ride availability- referred back to Dr Annalee Genta for admit orders. Call to Dr Annalee Genta at [phone] to request Obs orders- updated on pt's homeless status. Per Dr Annalee Genta patient" can stay in recovery until anesthesia wears off"  C. Shadow Schedler, RN verbalized to Dr Annalee Genta that patient could not be discharged from the hospital w/o a ride home or someone to care for him - per [facility] & [facility] standard of care -a patient must be watched for 24 hours after anesthesia- & it was not possible for patient to remain in PACU until tomorrow to recover from anesthesia" Dr Annalee Genta responded that "I have left the hospital & am not coming back to write admit orders for a homeless patient" C. Omaya Nieland asked to call hospitalist for adm/obs orders. Dr Annalee Genta agreed to C.Jatavious Peppard, RN following up with hospitalist. Awaiting call back from Dr Melynda Ripple (hospitalist). Dr Maple Hudson updated at the bedside.

## 2016-08-12 NOTE — ED Notes (Signed)
Patient transported to X-ray 

## 2016-08-12 NOTE — Anesthesia Postprocedure Evaluation (Addendum)
Anesthesia Post Note  Patient: Joseph Huff  Procedure(s) Performed: Procedure(s) (LRB): LARYNGOSCOPY AND CERVICAL ESOPHOGOSCOPY (N/A)  Patient location during evaluation: PACU Anesthesia Type: General Level of consciousness: awake Pain management: pain level controlled Vital Signs Assessment: post-procedure vital signs reviewed and stable Respiratory status: spontaneous breathing, nonlabored ventilation, respiratory function stable and patient connected to nasal cannula oxygen Cardiovascular status: blood pressure returned to baseline and stable Postop Assessment: no signs of nausea or vomiting Anesthetic complications: no       Last Vitals:  Vitals:   08/12/16 1152 08/12/16 1200  BP: 107/67   Pulse: 83   Resp: 19   Temp:  36.6 C    Last Pain:  Vitals:   08/12/16 1037  TempSrc:   PainSc: Asleep                 Philana Younis

## 2016-08-13 ENCOUNTER — Encounter (HOSPITAL_COMMUNITY): Payer: Self-pay | Admitting: Otolaryngology

## 2016-08-13 MED ORDER — PNEUMOCOCCAL VAC POLYVALENT 25 MCG/0.5ML IJ INJ
0.5000 mL | INJECTION | INTRAMUSCULAR | Status: AC
  Administered 2016-08-13: 0.5 mL via INTRAMUSCULAR
  Filled 2016-08-13: qty 0.5

## 2016-08-13 MED ORDER — ALBUTEROL SULFATE HFA 108 (90 BASE) MCG/ACT IN AERS
2.0000 | INHALATION_SPRAY | RESPIRATORY_TRACT | Status: DC | PRN
  Filled 2016-08-13: qty 6.7

## 2016-08-13 MED ORDER — ALBUTEROL SULFATE HFA 108 (90 BASE) MCG/ACT IN AERS: 2.0000 | INHALATION_SPRAY | RESPIRATORY_TRACT | 6 refills | Status: DC | PRN

## 2016-08-13 NOTE — Discharge Summary (Signed)
Physician Discharge Summary  Patient ID: Joseph Huff MRN: [id number] DOB/AGE: [birth year] 35 y.o.  Admit date: 08/12/2016 Discharge date: 08/13/2016  Admission Diagnoses:  Principal Problem:   Foreign body of pharynx Active Problems:   Dysphagia   Discharge Diagnoses:  Same  Surgeries: Procedure(s): LARYNGOSCOPY AND CERVICAL ESOPHOGOSCOPY on 08/12/2016   Consultants: None  Discharged Condition: Improved  Hospital Course: Williamson Huff is an 36 y.o. male who was admitted 08/12/2016 with a diagnosis of Principal Problem:   Foreign body of pharynx Active Problems:   Dysphagia  and went to the operating room on 08/12/2016 and underwent the above named procedures.   Pt stable post op day #1. D/C'ed in stable condition.  Recent vital signs:  Vitals:   08/13/16 0130 08/13/16 0548  BP: (!) 103/54 (!) 104/56  Pulse: 77 63  Resp: 18 16  Temp: 98.1 F (36.7 C) 98.1 F (36.7 C)    Recent laboratory studies:  Results for orders placed or performed during the hospital encounter of 08/12/16  I-stat chem 8, ed  Result Value Ref Range   Sodium 140 135 - 145 mmol/L   Potassium 3.7 3.5 - 5.1 mmol/L   Chloride 104 101 - 111 mmol/L   BUN 17 6 - 20 mg/dL   Creatinine, Ser 1.83 0.61 - 1.24 mg/dL   Glucose, Bld 76 65 - 99 mg/dL   Calcium, Ion 4.37 (L) 1.15 - 1.40 mmol/L   TCO2 26 0 - 100 mmol/L   Hemoglobin 16.0 13.0 - 17.0 g/dL   HCT 35.7 89.7 - 84.7 %    Discharge Medications:   Allergies as of 08/13/2016   No Known Allergies     Medication List    TAKE these medications   albuterol 108 (90 Base) MCG/ACT inhaler Commonly known as:  PROVENTIL HFA;VENTOLIN HFA Inhale 2 puffs into the lungs every 4 (four) hours as needed for wheezing or shortness of breath.   Fluticasone-Salmeterol 250-50 MCG/DOSE Aepb Commonly known as:  ADVAIR DISKUS Inhale 1 puff into the lungs daily.   HYDROcodone-acetaminophen 7.5-325 mg/15 ml solution Commonly known as:  HYCET Take 10-15 mLs by mouth  every 4 (four) hours as needed for moderate pain.   lidocaine 2 % solution Commonly known as:  XYLOCAINE Use as directed 10 mLs in the mouth or throat every 4 (four) hours as needed for mouth pain (sore throat).       Diagnostic Studies: Dg Neck Soft Tissue  Result Date: 08/12/2016 CLINICAL DATA:  Swallowed a piece of metal a few hour ago. LEFT jaw pain. EXAM: NECK SOFT TISSUES - 1+ VIEW COMPARISON:  None. FINDINGS: Subcentimeter metallic foreign body projects at the level of the LEFT piriform sinus. There is no evidence of retropharyngeal soft tissue swelling or epiglottic enlargement. The cervical airway is unremarkable. IMPRESSION: Metallic foreign body within LEFT piriform sinus region. Patent airway. Electronically Signed   By: Awilda Metro M.D.   On: 08/12/2016 06:48   Dg Chest 1 View  Result Date: 08/12/2016 CLINICAL DATA:  Swallowed metal object a few hours ago.  Hemoptysis. EXAM: CHEST 1 VIEW COMPARISON:  Chest radiograph August 09, 2016 FINDINGS: Cardiomediastinal silhouette is normal. No pleural effusions or focal consolidations. Hyperinflation . Trachea projects midline and there is no pneumothorax. Soft tissue planes and included osseous structures are non-suspicious. Subcentimeter radiopaque foreign body projecting in LEFT neck. IMPRESSION: Hyperinflation without focal consolidation. Metallic foreign body in LEFT neck. Electronically Signed   By: Awilda Metro M.D.   On: 08/12/2016 06:49  Dg Chest 2 View  Result Date: 08/09/2016 CLINICAL DATA:  Worsening asthma chest tightness EXAM: CHEST  2 VIEW COMPARISON:  07/12/2016 FINDINGS: The lungs are hyperinflated. No acute pulmonary infiltrate or effusion. Normal cardiomediastinal silhouette. No pneumothorax. IMPRESSION: Hyperinflation without focal infiltrate or edema Electronically Signed   By: Jasmine Pang M.D.   On: 08/09/2016 21:01   Dg Cervical Spine 1 View  Result Date: 08/12/2016 CLINICAL DATA:  Removal of foreign  body during laryngoscopy EXAM: DG C-ARM 61-120 MIN; DG CERVICAL SPINE - 1 VIEW COMPARISON:  None. FLUOROSCOPY TIME:  0 minutes 32 seconds; 1 acquired image FINDINGS: Frontal view shows bony structures intact. Apparent endotracheal tube present. No other radiopaque foreign body. Visualized lung apices are clear. IMPRESSION: No bony abnormality. No foreign body evident beyond apparent endotracheal tube. Electronically Signed   By: Bretta Bang III M.D.   On: 08/12/2016 11:04   Dg Abd 1 View  Result Date: 08/12/2016 CLINICAL DATA:  Foreign body EXAM: ABDOMEN - 1 VIEW COMPARISON:  None. FINDINGS: There is a metallic foreign body overlying the sacrum measuring 6 mm. Bowel gas pattern is normal. No bowel obstruction or free air. Lung bases clear. IMPRESSION: Foreign body overlying the sacrum.  Bowel gas pattern normal. Electronically Signed   By: Bretta Bang III M.D.   On: 08/12/2016 11:05   Dg Abdomen 1 View  Result Date: 08/12/2016 CLINICAL DATA:  Accidentally swallowed metal object a few hours ago. Nausea and vomiting. EXAM: ABDOMEN - 1 VIEW COMPARISON:  None. FINDINGS: The bowel gas pattern is normal. Stool distended rectum. No radio-opaque calculi or other significant radiographic abnormality are seen. IMPRESSION: Stool distended rectum without bowel obstruction. No radiopaque foreign bodies. Electronically Signed   By: Awilda Metro M.D.   On: 08/12/2016 06:50   Dg C-arm 1-60 Min  Result Date: 08/12/2016 CLINICAL DATA:  Removal of foreign body during laryngoscopy EXAM: DG C-ARM 61-120 MIN; DG CERVICAL SPINE - 1 VIEW COMPARISON:  None. FLUOROSCOPY TIME:  0 minutes 32 seconds; 1 acquired image FINDINGS: Frontal view shows bony structures intact. Apparent endotracheal tube present. No other radiopaque foreign body. Visualized lung apices are clear. IMPRESSION: No bony abnormality. No foreign body evident beyond apparent endotracheal tube. Electronically Signed   By: Bretta Bang III M.D.    On: 08/12/2016 11:04    Disposition: 01-Home or Self Care  Discharge Instructions    Diet - low sodium heart healthy    Complete by:  As directed    Diet - low sodium heart healthy    Complete by:  As directed    Diet - low sodium heart healthy    Complete by:  As directed    Discharge instructions    Complete by:  As directed    Post OP Instructions: 1. Limited activity 2. Liquid and soft diet, may advance as tolerated 3. May bathe and shower 4. Warm water gargle as needed 5. Elevate Head of Bed   Increase activity slowly    Complete by:  As directed    Increase activity slowly    Complete by:  As directed    Increase activity slowly    Complete by:  As directed       Follow-up Information    Chairty Toman, MD In 2 weeks.   Specialty:  Otolaryngology Why:  As needed Contact information: [address] [place] [address] [phone]            Signed: Annalee Genta, Zyrus Hetland  08/13/2016, 8:15 AM

## 2016-08-13 NOTE — Care Management Note (Signed)
Case Management Note  Patient Details  Name: Joseph Huff MRN: [id number] Date of Birth: 02-Jul-1980  Subjective/Objective:                    Action/Plan:  Referral for medication assistance. Confirmed with patient that he has Medicaid and co pays are $3. Unable to assist with co pays. Bedside nurse will ask MD for order for inhaler here before discharge and give inhaler to patient. Expected Discharge Date:  08/13/16               Expected Discharge Plan:  Home/Self Care  In-House Referral:  Clinical Social Work  Discharge planning Services  CM Consult, Medication Assistance  Post Acute Care Choice:    Choice offered to:  Patient  DME Arranged:    DME Agency:     HH Arranged:    HH Agency:     Status of Service:  Completed, signed off  If discussed at [employer], dates discussed:    Additional Comments:  Kingsley Plan, RN 08/13/2016, 9:44 AM

## 2016-08-13 NOTE — Clinical Social Work Note (Signed)
Clinical Social Work Assessment  Patient Details  Name: Joseph Huff MRN: [id number] Date of Birth: 04-May-1980  Date of referral:  08/13/16               Reason for consult:  Housing Concerns/Homelessness                Permission sought to share information with:  Family Supports Permission granted to share information::  No  Name::        Agency::     Relationship::  Wife  Contact Information:     Housing/Transportation Living arrangements for the past 2 months:  Homeless (living out of car) Source of Information:  Patient Patient Interpreter Needed:  None Criminal Activity/Legal Involvement Pertinent to Current Situation/Hospitalization:  No - Comment as needed Significant Relationships:  Spouse Lives with:  Spouse Do you feel safe going back to the place where you live?  Yes Need for family participation in patient care:  No (Coment)  Care giving concerns:  No caregiving concerns identified.    Social Worker assessment / plan:  CSW met with pt at bedside to address consult for " homeless issues." Pt came to [place] from [place] 38 years ago. Pt has been living out of his car with his wife for 38 months. 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 Pt states he was to start today, but could not due to hospitalization. 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 Pt states he has stayed in a shelter 1 night and did not go back due to bug infestation. Pt states he has asked brother in [place] for financial assistance, but brother is having financial hardship as well. Pt also has a 74 y/o daughter here in [place] that just got married. Pt does not want to "impose" by asking to move in with her. Pt was vague when sharing his history.   CSW provided active listening and validated feelings. CSW provided resources for food kitchens and pantries, as well as other  financial assistance programs and shelter listing. Pt was appreciative for CSW support. RNCM made aware of request for Rx. CSW signing off as no further SW needs identified.   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   Patient/Family's Understanding of and Emotional Response to Diagnosis, Current Treatment, and Prognosis:  Pt understands diagnosis, prognosis, and current treatment. Pt understands the importance of taking his medication regularly, but is saddened at his circumstances due to not having had a stable job.   Emotional Assessment Appearance:  Appears stated age Attitude/Demeanor/Rapport:  Other (Appropriate) Affect (typically observed):  Accepting, Pleasant, Hopeful Orientation:  Oriented to Self, Oriented to Place, Oriented to  Time, Oriented to Situation Alcohol / Substance use:  Other Psych involvement (Current and /or in the community):  No (Comment)  Discharge Needs  Concerns to be addressed:  Homelessness Readmission within the last 30 days:  No Current discharge risk:    Barriers to Discharge:  Homeless with medical needs   Truitt Merle, LCSW 08/13/2016, 5:40 PM

## 2016-08-13 NOTE — Progress Notes (Signed)
ENT Progress Note: POD #1 s/p Procedure(s): LARYNGOSCOPY AND CERVICAL ESOPHOGOSCOPY   Subjective: tol po, mild ST  Objective: Vital signs in last 24 hours: Temp:  [97.3 F (36.3 C)-98.3 F (36.8 C)] 98.1 F (36.7 C) (04/30 0548) Pulse Rate:  [63-102] 63 (04/30 0548) Resp:  [16-19] 16 (04/30 0548) BP: (93-125)/(54-85) 104/56 (04/30 0548) SpO2:  [91 %-98 %] 94 % (04/30 0548) Weight change:  Last BM Date: 08/10/16  Intake/Output from previous day: 04/29 0701 - 04/30 0700 In: 2846.3 [P.O.:240; I.V.:2156.3; IV Piggyback:200] Out: 2101 [Urine:2100; Blood:1] Intake/Output this shift: No intake/output data recorded.  Labs:  Recent Labs  08/12/16 0701  HGB 16.0  HCT 47.0    Recent Labs  08/12/16 0701  NA 140  K 3.7  CL 104  GLUCOSE 76  BUN 17    Studies/Results: Dg Neck Soft Tissue  Result Date: 08/12/2016 CLINICAL DATA:  Swallowed a piece of metal a few hour ago. LEFT jaw pain. EXAM: NECK SOFT TISSUES - 1+ VIEW COMPARISON:  None. FINDINGS: Subcentimeter metallic foreign body projects at the level of the LEFT piriform sinus. There is no evidence of retropharyngeal soft tissue swelling or epiglottic enlargement. The cervical airway is unremarkable. IMPRESSION: Metallic foreign body within LEFT piriform sinus region. Patent airway. Electronically Signed   By: Awilda Metro M.D.   On: 08/12/2016 06:48   Dg Chest 1 View  Result Date: 08/12/2016 CLINICAL DATA:  Swallowed metal object a few hours ago.  Hemoptysis. EXAM: CHEST 1 VIEW COMPARISON:  Chest radiograph August 09, 2016 FINDINGS: Cardiomediastinal silhouette is normal. No pleural effusions or focal consolidations. Hyperinflation . Trachea projects midline and there is no pneumothorax. Soft tissue planes and included osseous structures are non-suspicious. Subcentimeter radiopaque foreign body projecting in LEFT neck. IMPRESSION: Hyperinflation without focal consolidation. Metallic foreign body in LEFT neck.  Electronically Signed   By: Awilda Metro M.D.   On: 08/12/2016 06:49   Dg Cervical Spine 1 View  Result Date: 08/12/2016 CLINICAL DATA:  Removal of foreign body during laryngoscopy EXAM: DG C-ARM 61-120 MIN; DG CERVICAL SPINE - 1 VIEW COMPARISON:  None. FLUOROSCOPY TIME:  0 minutes 32 seconds; 1 acquired image FINDINGS: Frontal view shows bony structures intact. Apparent endotracheal tube present. No other radiopaque foreign body. Visualized lung apices are clear. IMPRESSION: No bony abnormality. No foreign body evident beyond apparent endotracheal tube. Electronically Signed   By: Bretta Bang III M.D.   On: 08/12/2016 11:04   Dg Abd 1 View  Result Date: 08/12/2016 CLINICAL DATA:  Foreign body EXAM: ABDOMEN - 1 VIEW COMPARISON:  None. FINDINGS: There is a metallic foreign body overlying the sacrum measuring 6 mm. Bowel gas pattern is normal. No bowel obstruction or free air. Lung bases clear. IMPRESSION: Foreign body overlying the sacrum.  Bowel gas pattern normal. Electronically Signed   By: Bretta Bang III M.D.   On: 08/12/2016 11:05   Dg Abdomen 1 View  Result Date: 08/12/2016 CLINICAL DATA:  Accidentally swallowed metal object a few hours ago. Nausea and vomiting. EXAM: ABDOMEN - 1 VIEW COMPARISON:  None. FINDINGS: The bowel gas pattern is normal. Stool distended rectum. No radio-opaque calculi or other significant radiographic abnormality are seen. IMPRESSION: Stool distended rectum without bowel obstruction. No radiopaque foreign bodies. Electronically Signed   By: Awilda Metro M.D.   On: 08/12/2016 06:50   Dg C-arm 1-60 Min  Result Date: 08/12/2016 CLINICAL DATA:  Removal of foreign body during laryngoscopy EXAM: DG C-ARM 61-120 MIN; DG  CERVICAL SPINE - 1 VIEW COMPARISON:  None. FLUOROSCOPY TIME:  0 minutes 32 seconds; 1 acquired image FINDINGS: Frontal view shows bony structures intact. Apparent endotracheal tube present. No other radiopaque foreign body. Visualized lung  apices are clear. IMPRESSION: No bony abnormality. No foreign body evident beyond apparent endotracheal tube. Electronically Signed   By: Bretta Bang III M.D.   On: 08/12/2016 11:04     PHYSICAL EXAM: Airway stable  No stridor   Assessment/Plan: D/C to home    Joseph Huff 08/13/2016, 8:10 AM
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
## 2016-09-03 ENCOUNTER — Emergency Department (HOSPITAL_COMMUNITY)
Admission: EM | Admit: 2016-09-03 | Discharge: 2016-09-04 | Disposition: A | Discharge status: Home / Self Care | Payer: Medicaid Other | Attending: Emergency Medicine | Admitting: Emergency Medicine

## 2016-09-03 ENCOUNTER — Encounter (HOSPITAL_COMMUNITY): Payer: Self-pay | Admitting: Family Medicine

## 2016-09-03 DIAGNOSIS — J189 Pneumonia, unspecified organism: Secondary | ICD-10-CM | POA: Insufficient documentation

## 2016-09-03 DIAGNOSIS — Z79899 Other long term (current) drug therapy: Secondary | ICD-10-CM | POA: Insufficient documentation

## 2016-09-03 DIAGNOSIS — J4541 Moderate persistent asthma with (acute) exacerbation: Secondary | ICD-10-CM | POA: Insufficient documentation

## 2016-09-03 DIAGNOSIS — F17210 Nicotine dependence, cigarettes, uncomplicated: Secondary | ICD-10-CM | POA: Insufficient documentation

## 2016-09-03 MED ORDER — IPRATROPIUM-ALBUTEROL 0.5-2.5 (3) MG/3ML IN SOLN
3.0000 mL | Freq: Once | RESPIRATORY_TRACT | Status: AC
  Administered 2016-09-04: 3 mL via RESPIRATORY_TRACT
  Filled 2016-09-03: qty 3

## 2016-09-03 NOTE — ED Provider Notes (Signed)
[facility]-EMERGENCY DEPT Provider Note   CSN: [id number] Arrival date & time: 09/03/16  2201     History   Chief Complaint Chief Complaint  Patient presents with  . Asthma    HPI Joseph Huff is a 36 y.o. male.  HPI 36 year old male with a history of asthma presents to the ED with shortness of breath. He reports that the shortness of breath has been ongoing for several days and gradually worsened over that time. Patient was found by EMS upon their arrival in tripod position, diaphoretic with accessory muscle use. Patient was given duo nebs, Solu-Medrol which significantly improved his symptoms. He denies any recent fevers, illnesses, infections. Denies any chest pain, nausea, vomiting, abdominal pain. Denies any other physical complaints.   Past Medical History:  Diagnosis Date  . Asthma     Patient Active Problem List   Diagnosis Date Noted  . Foreign body of pharynx 08/12/2016  . Dysphagia 08/12/2016    Past Surgical History:  Procedure Laterality Date  . DIRECT LARYNGOSCOPY N/A 08/12/2016   Procedure: LARYNGOSCOPY AND CERVICAL ESOPHOGOSCOPY;  Surgeon: Osborn Coho, MD;  Location: [facility] OR;  Service: ENT;  Laterality: N/A;       Home Medications    Prior to Admission medications   Medication Sig Start Date End Date Taking? Authorizing Provider  albuterol (PROVENTIL HFA;VENTOLIN HFA) 108 (90 Base) MCG/ACT inhaler Inhale 2 puffs into the lungs every 4 (four) hours as needed for wheezing or shortness of breath. 08/13/16  Yes Osborn Coho, MD  Fluticasone-Salmeterol (ADVAIR DISKUS) 250-50 MCG/DOSE AEPB Inhale 1 puff into the lungs daily. Patient not taking: Reported on 08/09/2016 07/12/16   Maxwell Caul, PA-C  HYDROcodone-acetaminophen (HYCET) 7.5-325 mg/15 ml solution Take 10-15 mLs by mouth every 4 (four) hours as needed for moderate pain. Patient not taking: Reported on 09/03/2016 08/12/16   Osborn Coho, MD  lidocaine (XYLOCAINE) 2 % solution Use as directed 10  mLs in the mouth or throat every 4 (four) hours as needed for mouth pain (sore throat). Patient not taking: Reported on 09/03/2016 08/12/16   Osborn Coho, MD  predniSONE (DELTASONE) 10 MG tablet Take 4 tablets (40 mg total) by mouth daily. 09/04/16 09/08/16  Nira Conn, MD    Family History History reviewed. No pertinent family history.  Social History Social History  Substance Use Topics  . Smoking status: Current Every Day Smoker    Types: Cigarettes    Last attempt to quit: 08/01/2016  . Smokeless tobacco: Never Used  . Alcohol use Yes     Comment: Once a month     Allergies   Patient has no known allergies.   Review of Systems Review of Systems All other systems are reviewed and are negative for acute change except as noted in the HPI   Physical Exam Updated Vital Signs BP 135/66 (BP Location: Right Arm)   Pulse 95   Temp 98 F (36.7 C) (Oral)   Resp (!) 22   Ht 6' (1.829 m)   Wt 84.8 kg (187 lb)   SpO2 96%   BMI 25.36 kg/m   Physical Exam  Constitutional: He is oriented to person, place, and time. He appears well-developed and well-nourished. No distress.  HENT:  Head: Normocephalic and atraumatic.  Nose: Nose normal.  Eyes: Conjunctivae and EOM are normal. Pupils are equal, round, and reactive to light. Right eye exhibits no discharge. Left eye exhibits no discharge. No scleral icterus.  Neck: Normal range of motion. Neck supple.  Cardiovascular: Normal rate and regular rhythm.  Exam reveals no gallop and no friction rub.   No murmur heard. Pulmonary/Chest: Effort normal. No accessory muscle usage or stridor. No tachypnea. No respiratory distress. He has wheezes (Diffuse mild expiratory wheezes). He has no rales.  Abdominal: Soft. He exhibits no distension. There is no tenderness.  Musculoskeletal: He exhibits no edema or tenderness.  Neurological: He is alert and oriented to person, place, and time.  Skin: Skin is warm and dry. No rash noted.  He is not diaphoretic. No erythema.  Psychiatric: He has a normal mood and affect.  Vitals reviewed.    ED Treatments / Results  Labs (all labs ordered are listed, but only abnormal results are displayed) Labs Reviewed - No data to display  EKG  EKG Interpretation None       Radiology No results found.  Procedures Procedures (including critical care time)  Medications Ordered in ED Medications  ipratropium-albuterol (DUONEB) 0.5-2.5 (3) MG/3ML nebulizer solution 3 mL (3 mLs Nebulization Given 09/04/16 0000)     Initial Impression / Assessment and Plan / ED Course  I have reviewed the triage vital signs and the nursing notes.  Pertinent labs & imaging results that were available during my care of the patient were reviewed by me and considered in my medical decision making (see chart for details).     Asthma exacerbation improved with medicine provided with EMS. Patient still having some extra wheezes. We'll provide additional DuoNeb and reassessed.  12:07 AM Complete resolution of patient's wheezing.  The patient is safe for discharge with strict return precautions.   Final Clinical Impressions(s) / ED Diagnoses   Final diagnoses:  Moderate persistent asthma with exacerbation   Disposition: Discharge  Condition: Good  I have discussed the results, Dx and Tx plan with the patient who expressed understanding and agree(s) with the plan. Discharge instructions discussed at great length. The patient was given strict return precautions who verbalized understanding of the instructions. No further questions at time of discharge.    New Prescriptions   PREDNISONE (DELTASONE) 10 MG TABLET    Take 4 tablets (40 mg total) by mouth daily.    Follow Up: primary care provider         Nira Conn, MD 09/04/16 [phone]

## 2016-09-03 NOTE — ED Triage Notes (Signed)
Patient was picked from side of street and transported via [facility]. Pt is experiencing an asthma attack. When EMS arrived to him, he was in a tripod position, diaphoretic, and using intercostal muscles to breath. Patient was given ALBUTEROL 5mg , DUONEB, and SOLU-MEDROL 125mg  by EMS.

## 2016-09-03 NOTE — ED Notes (Signed)
Bed: WA03 Expected date:  Expected time:  Means of arrival:  Comments: Respiratory distress

## 2016-09-03 NOTE — ED Notes (Signed)
RT at bedside.

## 2016-09-04 ENCOUNTER — Emergency Department (HOSPITAL_COMMUNITY): Payer: Medicaid Other

## 2016-09-04 ENCOUNTER — Encounter (HOSPITAL_COMMUNITY): Payer: Self-pay | Admitting: Emergency Medicine

## 2016-09-04 ENCOUNTER — Emergency Department (HOSPITAL_COMMUNITY)
Admission: EM | Admit: 2016-09-04 | Discharge: 2016-09-04 | Disposition: A | Discharge status: Home / Self Care | Payer: Medicaid Other | Source: Home / Self Care | Attending: Emergency Medicine | Admitting: Emergency Medicine

## 2016-09-04 DIAGNOSIS — J45909 Unspecified asthma, uncomplicated: Secondary | ICD-10-CM | POA: Diagnosis present

## 2016-09-04 DIAGNOSIS — F17210 Nicotine dependence, cigarettes, uncomplicated: Secondary | ICD-10-CM | POA: Diagnosis not present

## 2016-09-04 DIAGNOSIS — J4541 Moderate persistent asthma with (acute) exacerbation: Secondary | ICD-10-CM

## 2016-09-04 DIAGNOSIS — J189 Pneumonia, unspecified organism: Secondary | ICD-10-CM | POA: Diagnosis not present

## 2016-09-04 DIAGNOSIS — Z79899 Other long term (current) drug therapy: Secondary | ICD-10-CM | POA: Diagnosis not present

## 2016-09-04 MED ORDER — ALBUTEROL SULFATE (2.5 MG/3ML) 0.083% IN NEBU
5.0000 mg | INHALATION_SOLUTION | Freq: Once | RESPIRATORY_TRACT | Status: AC
  Administered 2016-09-04: 5 mg via RESPIRATORY_TRACT
  Filled 2016-09-04: qty 6

## 2016-09-04 MED ORDER — AZITHROMYCIN 250 MG PO TABS: 250.0000 mg | ORAL_TABLET | Freq: Every day | ORAL | 0 refills | Status: DC

## 2016-09-04 MED ORDER — PREDNISONE 20 MG PO TABS
40.0000 mg | ORAL_TABLET | Freq: Once | ORAL | Status: AC
  Administered 2016-09-04: 40 mg via ORAL
  Filled 2016-09-04: qty 2

## 2016-09-04 MED ORDER — PREDNISONE 10 MG PO TABS: 40.0000 mg | ORAL_TABLET | Freq: Every day | ORAL | 0 refills | Status: AC

## 2016-09-04 MED ORDER — ALBUTEROL SULFATE (2.5 MG/3ML) 0.083% IN NEBU: 5.0000 mg | INHALATION_SOLUTION | Freq: Once | RESPIRATORY_TRACT | Status: DC

## 2016-09-04 MED ORDER — IPRATROPIUM BROMIDE 0.02 % IN SOLN
0.5000 mg | Freq: Once | RESPIRATORY_TRACT | Status: AC
  Administered 2016-09-04: 0.5 mg via RESPIRATORY_TRACT
  Filled 2016-09-04: qty 2.5

## 2016-09-04 MED ORDER — ALBUTEROL SULFATE HFA 108 (90 BASE) MCG/ACT IN AERS
1.0000 | INHALATION_SPRAY | Freq: Once | RESPIRATORY_TRACT | Status: AC
  Administered 2016-09-04: 1 via RESPIRATORY_TRACT
  Filled 2016-09-04: qty 6.7

## 2016-09-04 MED ORDER — ALBUTEROL SULFATE (2.5 MG/3ML) 0.083% IN NEBU
2.5000 mg | INHALATION_SOLUTION | Freq: Once | RESPIRATORY_TRACT | Status: AC
  Administered 2016-09-04: 2.5 mg via RESPIRATORY_TRACT
  Filled 2016-09-04: qty 3

## 2016-09-04 NOTE — ED Notes (Signed)
RT CALLED

## 2016-09-04 NOTE — ED Triage Notes (Signed)
Pt c/o asthma exacerbation, cough, SOB. Gets SOB with speech, pauses to get air. Here yesterday for same.

## 2016-09-04 NOTE — ED Provider Notes (Signed)
[facility]-EMERGENCY DEPT Provider Note   CSN: [id number] Arrival date & time: 09/04/16  0705     History   Chief Complaint Chief Complaint  Patient presents with  . Asthma    HPI Joseph Huff is a 36 y.o. male with PMHX asthma who patient presents today with chief complaint gradual onset, waxing and waning shortness of breath. He was seen and evaluated here yesterday with asthma exacerbation with significant improvement in shortness of breath after breathing treatments. He states that his shortness of breath began again after discharge. Filled the prescription for the prednisone that he was given yesterday. He notes DOE and midsternal chest pressure/tightness which began this morning that does not radiate which lasted for 2 hours with her SOB. He also endorses a cough that is intermittently productive of brown sputum since yesterday. He has not tried anything for these symptoms.  He was given a nebulizer prior to my assessment, which she states significantly helped the shortness of breath.  Denies fever, chills,  abd pain, n/v/d. No other physical complaints  The history is provided by the patient.    Past Medical History:  Diagnosis Date  . Asthma     Patient Active Problem List   Diagnosis Date Noted  . Foreign body of pharynx 08/12/2016  . Dysphagia 08/12/2016    Past Surgical History:  Procedure Laterality Date  . DIRECT LARYNGOSCOPY N/A 08/12/2016   Procedure: LARYNGOSCOPY AND CERVICAL ESOPHOGOSCOPY;  Surgeon: Osborn Coho, MD;  Location: [facility] OR;  Service: ENT;  Laterality: N/A;       Home Medications    Prior to Admission medications   Medication Sig Start Date End Date Taking? Authorizing Provider  albuterol (PROVENTIL HFA;VENTOLIN HFA) 108 (90 Base) MCG/ACT inhaler Inhale 2 puffs into the lungs every 4 (four) hours as needed for wheezing or shortness of breath. 08/13/16  Yes Osborn Coho, MD  azithromycin (ZITHROMAX) 250 MG tablet Take 1 tablet (250 mg total) by  mouth daily. Take first 2 tablets together, then 1 every day until finished. 09/04/16   Anthoni Geerts, Dorene Grebe A, PA-C  Fluticasone-Salmeterol (ADVAIR DISKUS) 250-50 MCG/DOSE AEPB Inhale 1 puff into the lungs daily. Patient not taking: Reported on 08/09/2016 07/12/16   Maxwell Caul, PA-C  HYDROcodone-acetaminophen (HYCET) 7.5-325 mg/15 ml solution Take 10-15 mLs by mouth every 4 (four) hours as needed for moderate pain. Patient not taking: Reported on 09/03/2016 08/12/16   Osborn Coho, MD  lidocaine (XYLOCAINE) 2 % solution Use as directed 10 mLs in the mouth or throat every 4 (four) hours as needed for mouth pain (sore throat). Patient not taking: Reported on 09/03/2016 08/12/16   Osborn Coho, MD  predniSONE (DELTASONE) 10 MG tablet Take 4 tablets (40 mg total) by mouth daily. 09/04/16 09/08/16  Nira Conn, MD    Family History History reviewed. No pertinent family history.  Social History Social History  Substance Use Topics  . Smoking status: Current Every Day Smoker    Types: Cigarettes    Last attempt to quit: 08/01/2016  . Smokeless tobacco: Never Used  . Alcohol use Yes     Comment: Once a month     Allergies   Patient has no known allergies.   Review of Systems Review of Systems  Constitutional: Negative for chills and fever.  Respiratory: Positive for cough, chest tightness and shortness of breath.   Cardiovascular: Negative for chest pain, palpitations and leg swelling.  Gastrointestinal: Negative for abdominal pain, diarrhea, nausea and vomiting.  All other systems  reviewed and are negative.    Physical Exam Updated Vital Signs BP 119/70 (BP Location: Right Arm)   Pulse 72   Temp 97.6 F (36.4 C) (Oral)   Resp 16   SpO2 98%   Physical Exam  Constitutional: He appears well-developed and well-nourished. No distress.  HENT:  Head: Normocephalic and atraumatic.  Right Ear: External ear normal.  Left Ear: External ear normal.  Nose: Nose normal.    Eyes: Conjunctivae are normal. Right eye exhibits no discharge. Left eye exhibits no discharge.  Neck: No JVD present. No tracheal deviation present.  Cardiovascular: Normal rate, regular rhythm, normal heart sounds and intact distal pulses.   2+ radial and DP/PT pulses bl, negative Homan's bl   Pulmonary/Chest: Effort normal. No stridor. No respiratory distress. He has wheezes. He exhibits tenderness.  Anterior chest wall tender to palpation. No accessory muscle use. Able to complete sentences without difficulty. Diffuse expiratory and inspiratory wheezing on auscultation.   Abdominal: Soft. Bowel sounds are normal. He exhibits no distension. There is no tenderness.  Musculoskeletal: He exhibits no edema.  Lymphadenopathy:    He has no cervical adenopathy.  Neurological: He is alert.  Skin: Skin is dry. Capillary refill takes less than 2 seconds. He is not diaphoretic.  Psychiatric: He has a normal mood and affect. His behavior is normal.     ED Treatments / Results  Labs (all labs ordered are listed, but only abnormal results are displayed) Labs Reviewed - No data to display  EKG  EKG Interpretation None       Radiology Dg Chest 2 View  Result Date: 09/04/2016 CLINICAL DATA:  Increased shortness of breath. EXAM: CHEST  2 VIEW COMPARISON:  08/12/2016 . FINDINGS: Mediastinum and hilar structures are normal. Mild subsegmental atelectasis versus infiltrate left mid lung. A pleural effusion or pneumothorax. Heart size normal. IMPRESSION: Mild subsegmental atelectasis versus infiltrate left mid lung. Electronically Signed   By: Maisie Fus  Register   On: 09/04/2016 09:45    Procedures Procedures (including critical care time)  Medications Ordered in ED Medications  albuterol (PROVENTIL) (2.5 MG/3ML) 0.083% nebulizer solution 5 mg (5 mg Nebulization Given 09/04/16 0734)  ipratropium (ATROVENT) nebulizer solution 0.5 mg (0.5 mg Nebulization Given 09/04/16 0942)  albuterol (PROVENTIL)  (2.5 MG/3ML) 0.083% nebulizer solution 2.5 mg (2.5 mg Nebulization Given 09/04/16 0942)  predniSONE (DELTASONE) tablet 40 mg (40 mg Oral Given 09/04/16 0943)  albuterol (PROVENTIL HFA;VENTOLIN HFA) 108 (90 Base) MCG/ACT inhaler 1 puff (1 puff Inhalation Given 09/04/16 1037)     Initial Impression / Assessment and Plan / ED Course  I have reviewed the triage vital signs and the nursing notes.  Pertinent labs & imaging results that were available during my care of the patient were reviewed by me and considered in my medical decision making (see chart for details).     Patient with history of asthma presents with asthma exacerbation. Evaluated yesterday for the same. Has not taken his steroid or used his rescue inhaler since yesterday. Wheezing on auscultation, given another breathing treatment and prednisone. EKG with no significant change from last. Chest x-ray shows left mid subsegmental atelectasis versus infiltrate. Given productive cough, will treat with azithromycin for CAP. Low suspicion of PE, ACS/MI, pericarditis, bronchitis. On re-auscultation wheezing has significantly improved. Encourage patient to fill his prescription for steroid, and we will give refill of albuterol rescue inhaler. Given resources for establishment of primary care. Discussed indications for return to the ED. Pt verbalized understanding of and agreement  with plan and is safe for discharge home at this time.  Final Clinical Impressions(s) / ED Diagnoses   Final diagnoses:  Moderate persistent asthma with exacerbation  Community acquired pneumonia of left lung, unspecified part of lung    New Prescriptions New Prescriptions   AZITHROMYCIN (ZITHROMAX) 250 MG TABLET    Take 1 tablet (250 mg total) by mouth daily. Take first 2 tablets together, then 1 every day until finished.     Jeanie Sewer, PA-C 09/04/16 1042    Lorre Nick, MD 09/04/16 [phone]

## 2016-09-04 NOTE — Discharge Instructions (Signed)
Please take all of your antibiotics until finished!   You may develop abdominal discomfort or diarrhea from the antibiotic.  You may help offset this with probiotics which you can buy or get in yogurt. Do not eat  or take the probiotics until 2 hours after your antibiotic.   Take your steroid as prescribed for the next few days. Use your rescue inhaler as needed. Follow up with a primary care physician for reevaluation this week. Return to the ED if any concerning symptoms develop.

## 2016-09-16 ENCOUNTER — Other Ambulatory Visit: Payer: Self-pay

## 2016-09-16 ENCOUNTER — Emergency Department (HOSPITAL_COMMUNITY)
Admission: EM | Admit: 2016-09-16 | Discharge: 2016-09-16 | Disposition: A | Discharge status: Home / Self Care | Payer: Medicaid Other | Attending: Emergency Medicine | Admitting: Emergency Medicine

## 2016-09-16 ENCOUNTER — Emergency Department (HOSPITAL_COMMUNITY): Payer: Medicaid Other

## 2016-09-16 ENCOUNTER — Encounter (HOSPITAL_COMMUNITY): Payer: Self-pay | Admitting: Emergency Medicine

## 2016-09-16 DIAGNOSIS — Z79899 Other long term (current) drug therapy: Secondary | ICD-10-CM | POA: Insufficient documentation

## 2016-09-16 DIAGNOSIS — J45901 Unspecified asthma with (acute) exacerbation: Secondary | ICD-10-CM | POA: Diagnosis not present

## 2016-09-16 DIAGNOSIS — R062 Wheezing: Secondary | ICD-10-CM | POA: Diagnosis present

## 2016-09-16 DIAGNOSIS — F17210 Nicotine dependence, cigarettes, uncomplicated: Secondary | ICD-10-CM | POA: Diagnosis not present

## 2016-09-16 DIAGNOSIS — J4541 Moderate persistent asthma with (acute) exacerbation: Secondary | ICD-10-CM

## 2016-09-16 MED ORDER — ALBUTEROL SULFATE HFA 108 (90 BASE) MCG/ACT IN AERS: 2.0000 | INHALATION_SPRAY | RESPIRATORY_TRACT | 1 refills | Status: DC | PRN

## 2016-09-16 MED ORDER — ALBUTEROL SULFATE (2.5 MG/3ML) 0.083% IN NEBU
5.0000 mg | INHALATION_SOLUTION | Freq: Once | RESPIRATORY_TRACT | Status: AC
  Administered 2016-09-16: 5 mg via RESPIRATORY_TRACT
  Filled 2016-09-16: qty 6

## 2016-09-16 MED ORDER — PREDNISONE 20 MG PO TABS
60.0000 mg | ORAL_TABLET | Freq: Every day | ORAL | 0 refills | Status: DC
Start: 2016-09-16 — End: 2016-11-10

## 2016-09-16 MED ORDER — PREDNISONE 20 MG PO TABS
60.0000 mg | ORAL_TABLET | Freq: Once | ORAL | Status: AC
  Administered 2016-09-16: 60 mg via ORAL
  Filled 2016-09-16: qty 3

## 2016-09-16 MED ORDER — IPRATROPIUM BROMIDE 0.02 % IN SOLN
0.5000 mg | Freq: Once | RESPIRATORY_TRACT | Status: AC
  Administered 2016-09-16: 0.5 mg via RESPIRATORY_TRACT
  Filled 2016-09-16: qty 2.5

## 2016-09-16 NOTE — ED Provider Notes (Signed)
[facility]-EMERGENCY DEPT Provider Note   CSN: [id number] Arrival date & time: 09/16/16  0709     History   Chief Complaint Chief Complaint  Patient presents with  . Asthma    HPI Joseph Huff is a 36 y.o. male.  Patient with hx asthma, c/o increased wheezing in past few days. Occasional non prod cough. Mod sob. Denies chest pain or discomfort. Symptoms have been persistent, worse today. Normally uses inhaler on prn basis only. Continues to smoke. Denies sore throat, runny nose or other uri c/o. No fever or chills. No leg pain or swelling.    The history is provided by the patient.  Asthma  Associated symptoms include shortness of breath. Pertinent negatives include no chest pain, no abdominal pain and no headaches.    Past Medical History:  Diagnosis Date  . Asthma     Patient Active Problem List   Diagnosis Date Noted  . Foreign body of pharynx 08/12/2016  . Dysphagia 08/12/2016    Past Surgical History:  Procedure Laterality Date  . DIRECT LARYNGOSCOPY N/A 08/12/2016   Procedure: LARYNGOSCOPY AND CERVICAL ESOPHOGOSCOPY;  Surgeon: Osborn Coho, MD;  Location: [facility] OR;  Service: ENT;  Laterality: N/A;       Home Medications    Prior to Admission medications   Medication Sig Start Date End Date Taking? Authorizing Provider  albuterol (PROVENTIL HFA;VENTOLIN HFA) 108 (90 Base) MCG/ACT inhaler Inhale 2 puffs into the lungs every 4 (four) hours as needed for wheezing or shortness of breath. 08/13/16   Osborn Coho, MD  azithromycin (ZITHROMAX) 250 MG tablet Take 1 tablet (250 mg total) by mouth daily. Take first 2 tablets together, then 1 every day until finished. 09/04/16   Fawze, Dorene Grebe A, PA-C  Fluticasone-Salmeterol (ADVAIR DISKUS) 250-50 MCG/DOSE AEPB Inhale 1 puff into the lungs daily. Patient not taking: Reported on 08/09/2016 07/12/16   Maxwell Caul, PA-C  HYDROcodone-acetaminophen (HYCET) 7.5-325 mg/15 ml solution Take 10-15 mLs by mouth every 4 (four) hours as  needed for moderate pain. Patient not taking: Reported on 09/03/2016 08/12/16   Osborn Coho, MD  lidocaine (XYLOCAINE) 2 % solution Use as directed 10 mLs in the mouth or throat every 4 (four) hours as needed for mouth pain (sore throat). Patient not taking: Reported on 09/03/2016 08/12/16   Osborn Coho, MD    Family History No family history on file.  Social History Social History  Substance Use Topics  . Smoking status: Current Every Day Smoker    Types: Cigarettes    Last attempt to quit: 08/01/2016  . Smokeless tobacco: Never Used  . Alcohol use Yes     Comment: Once a month     Allergies   Patient has no known allergies.   Review of Systems Review of Systems  Constitutional: Negative for chills and fever.  HENT: Negative for sore throat.   Eyes: Negative for redness.  Respiratory: Positive for cough, shortness of breath and wheezing.   Cardiovascular: Negative for chest pain and leg swelling.  Gastrointestinal: Negative for abdominal pain and vomiting.  Genitourinary: Negative for flank pain.  Musculoskeletal: Negative for back pain and neck pain.  Skin: Negative for rash.  Neurological: Negative for headaches.  Hematological: Does not bruise/bleed easily.  Psychiatric/Behavioral: Negative for confusion.     Physical Exam Updated Vital Signs BP 109/72 (BP Location: Left Arm)   Pulse 77   Temp 98.3 F (36.8 C) (Oral)   Resp 14   Ht 1.829 m (6')  Wt 84.8 kg (187 lb)   SpO2 94%   BMI 25.36 kg/m   Physical Exam  Constitutional: He appears well-developed and well-nourished. No distress.  HENT:  Nose: Nose normal.  Mouth/Throat: Oropharynx is clear and moist.  Eyes: Conjunctivae are normal.  Neck: Neck supple. No tracheal deviation present.  Cardiovascular: Normal rate, regular rhythm, normal heart sounds and intact distal pulses.  Exam reveals no gallop and no friction rub.   No murmur heard. Pulmonary/Chest: Effort normal. No accessory muscle  usage. No respiratory distress. He has wheezes.  Abdominal: Soft. Bowel sounds are normal. He exhibits no distension. There is no tenderness.  Musculoskeletal: He exhibits no edema or tenderness.  Neurological: He is alert.  Skin: Skin is warm and dry. No rash noted. He is not diaphoretic.  Psychiatric: He has a normal mood and affect.  Nursing note and vitals reviewed.    ED Treatments / Results  Labs (all labs ordered are listed, but only abnormal results are displayed) Labs Reviewed - No data to display  EKG  EKG Interpretation None       Radiology Dg Chest 2 View  Result Date: 09/16/2016 CLINICAL DATA:  Asthma exacerbation. EXAM: CHEST  2 VIEW COMPARISON:  09/04/2016 FINDINGS: Lungs are hyperexpanded. Interstitial markings are diffusely coarsened with chronic features. The lungs are clear wiithout focal pneumonia, edema, pneumothorax or pleural effusion. The cardiopericardial silhouette is within normal limits for size. The visualized bony structures of the thorax are intact. Telemetry leads overlie the chest. IMPRESSION: Hyperexpansion without acute cardiopulmonary findings. Electronically Signed   By: Kennith Center M.D.   On: 09/16/2016 08:00    Procedures Procedures (including critical care time)  Medications Ordered in ED Medications  albuterol (PROVENTIL) (2.5 MG/3ML) 0.083% nebulizer solution 5 mg (5 mg Nebulization Given 09/16/16 0804)  ipratropium (ATROVENT) nebulizer solution 0.5 mg (0.5 mg Nebulization Given 09/16/16 0804)  predniSONE (DELTASONE) tablet 60 mg (60 mg Oral Given 09/16/16 0803)     Initial Impression / Assessment and Plan / ED Course  I have reviewed the triage vital signs and the nursing notes.  Pertinent labs & imaging results that were available during my care of the patient were reviewed by me and considered in my medical decision making (see chart for details).    Albuterol neb.  Persistent wheezing.  Albuterol and atrovent neb.    Prednisone po.  Recheck good air exchange, no increased wob. Wheezing markedly improved.  Pt currently appears stable for d/c.     Final Clinical Impressions(s) / ED Diagnoses   Final diagnoses:  None    New Prescriptions New Prescriptions   No medications on file     Cathren Laine, MD 09/16/16 [phone]

## 2016-09-16 NOTE — Discharge Instructions (Signed)
It was our pleasure to provide your ER care today - we hope that you feel better.  Use albuterol inhaler as need.  Take prednisone as prescribed.  No smoking - smoking will make your asthma and breathing much worse.   Follow up with primary care doctor in the next 1-2 weeks.  Return to ER if worse, increased trouble breathing, other concern.

## 2016-09-16 NOTE — ED Notes (Signed)
Patient continues to fall asleep during neb treatment. RN reminds patient to be awake for tx. Patient very drowsy. States he hasn't slept well since yesterday.

## 2016-09-16 NOTE — ED Triage Notes (Signed)
Pt reports last night began having an asthma exacerbation. EMS reports upper lobes diminished and wheezing upon arrival with no sounds to bilateral lower lobes. Pt was given 125mg  solumedrol and 10 of albuterol with 0.5 of Atrovent. Breath sounds presently wheezing to all lobes. Pt has been dealing with several asthma attacks and has been to ED several times since April. Pt also reports he was given an antibiotic for a lung infection but was unable to afford to get it filled.  20G PIV to L FA.

## 2016-09-16 NOTE — ED Notes (Signed)
Patient transported to X-ray 

## 2016-09-16 NOTE — ED Notes (Signed)
Pt breathing treatment complete. -- pt noted to be falling asleep while this NT completed vital signs.

## 2016-09-17 NOTE — Addendum Note (Signed)
Addendum  created 09/17/16 1433 by Val Eagle, MD   Sign clinical note

## 2016-10-18 ENCOUNTER — Emergency Department (HOSPITAL_COMMUNITY)
Admission: EM | Admit: 2016-10-18 | Discharge: 2016-10-18 | Disposition: A | Discharge status: Home / Self Care | Payer: Medicaid Other | Attending: Emergency Medicine | Admitting: Emergency Medicine

## 2016-10-18 ENCOUNTER — Encounter (HOSPITAL_COMMUNITY): Payer: Self-pay

## 2016-10-18 DIAGNOSIS — Z79899 Other long term (current) drug therapy: Secondary | ICD-10-CM | POA: Insufficient documentation

## 2016-10-18 DIAGNOSIS — J45909 Unspecified asthma, uncomplicated: Secondary | ICD-10-CM | POA: Insufficient documentation

## 2016-10-18 DIAGNOSIS — M79652 Pain in left thigh: Secondary | ICD-10-CM | POA: Diagnosis present

## 2016-10-18 DIAGNOSIS — F17210 Nicotine dependence, cigarettes, uncomplicated: Secondary | ICD-10-CM | POA: Diagnosis not present

## 2016-10-18 DIAGNOSIS — M79605 Pain in left leg: Secondary | ICD-10-CM

## 2016-10-18 MED ORDER — METHOCARBAMOL 1000 MG/10ML IJ SOLN: 1000.0000 mg | Freq: Once | INTRAMUSCULAR | Status: DC

## 2016-10-18 MED ORDER — METHOCARBAMOL 500 MG PO TABS
500.0000 mg | ORAL_TABLET | Freq: Once | ORAL | Status: AC
  Administered 2016-10-18: 500 mg via ORAL
  Filled 2016-10-18: qty 1

## 2016-10-18 MED ORDER — METHOCARBAMOL 500 MG PO TABS: 500.0000 mg | ORAL_TABLET | Freq: Two times a day (BID) | ORAL | 0 refills | Status: DC

## 2016-10-18 NOTE — ED Notes (Signed)
Pt arrives via [facility] and sent to triage - Pt reports he woke up with sharp left leg pain that starts in his left buttocks that radiates down his left leg. Ambulatory with assistance due to the pain. Denies injury. BP-110/70, HR-84, RR-14

## 2016-10-18 NOTE — ED Provider Notes (Signed)
[facility]PT Provider Note   CSN: [id number] Arrival date & time: 10/18/16  1236  By signing my name below, I, Linna Darner, attest that this documentation has been prepared under the direction and in the presence of [employer], PA-C. Electronically Signed: Linna Darner, Scribe. 10/18/2016. 4:18 PM.  History   Chief Complaint Chief Complaint  Patient presents with  . Leg Pain   The history is provided by the patient. No language interpreter was used.    HPI Comments: Joseph Huff is a 36 y.o. male who presents to the Emergency Department via EMS complaining of constant, severe pain in the left buttock beginning today upon waking. He states his pain has been unchanging throughout the day and he has been unable to bear weight due to pain. Patient reports some associated mild tingling in the plantar surface of his left foot. He works at [employer] and did typical lifting and moving during his shift yesterday but denies any unusual activity or over-exertion. He has no prior h/o the same. Patient does note that several weeks ago he struck his left buttock on a bed frame but did not experience any significant pain at the time. He denies fevers, chills, or any other associated symptoms.  Past Medical History:  Diagnosis Date  . Asthma     Patient Active Problem List   Diagnosis Date Noted  . Foreign body of pharynx 08/12/2016  . Dysphagia 08/12/2016    Past Surgical History:  Procedure Laterality Date  . DIRECT LARYNGOSCOPY N/A 08/12/2016   Procedure: LARYNGOSCOPY AND CERVICAL ESOPHOGOSCOPY;  Surgeon: Osborn Coho, MD;  Location: [facility] OR;  Service: ENT;  Laterality: N/A;       Home Medications    Prior to Admission medications   Medication Sig Start Date End Date Taking? Authorizing Provider  albuterol (PROVENTIL HFA;VENTOLIN HFA) 108 (90 Base) MCG/ACT inhaler Inhale 2 puffs into the lungs every 4 (four) hours as needed for wheezing or shortness of breath. 08/13/16    Osborn Coho, MD  albuterol (PROVENTIL HFA;VENTOLIN HFA) 108 (90 Base) MCG/ACT inhaler Inhale 2 puffs into the lungs every 4 (four) hours as needed for wheezing or shortness of breath. 09/16/16   Cathren Laine, MD  azithromycin (ZITHROMAX) 250 MG tablet Take 1 tablet (250 mg total) by mouth daily. Take first 2 tablets together, then 1 every day until finished. 09/04/16   Fawze, Dorene Grebe A, PA-C  Fluticasone-Salmeterol (ADVAIR DISKUS) 250-50 MCG/DOSE AEPB Inhale 1 puff into the lungs daily. Patient not taking: Reported on 08/09/2016 07/12/16   Maxwell Caul, PA-C  HYDROcodone-acetaminophen (HYCET) 7.5-325 mg/15 ml solution Take 10-15 mLs by mouth every 4 (four) hours as needed for moderate pain. Patient not taking: Reported on 09/03/2016 08/12/16   Osborn Coho, MD  lidocaine (XYLOCAINE) 2 % solution Use as directed 10 mLs in the mouth or throat every 4 (four) hours as needed for mouth pain (sore throat). Patient not taking: Reported on 09/03/2016 08/12/16   Osborn Coho, MD  methocarbamol (ROBAXIN) 500 MG tablet Take 1 tablet (500 mg total) by mouth 2 (two) times daily. 10/18/16   Aerilyn Slee, Tinnie Gens, PA-C  predniSONE (DELTASONE) 20 MG tablet Take 3 tablets (60 mg total) by mouth daily. 09/16/16   Cathren Laine, MD    Family History No family history on file.  Social History Social History  Substance Use Topics  . Smoking status: Current Every Day Smoker    Types: Cigarettes    Last attempt to quit: 08/01/2016  . Smokeless tobacco:  Never Used  . Alcohol use Yes     Comment: Once a month     Allergies   Patient has no known allergies.   Review of Systems Review of Systems  Constitutional: Negative for chills and fever.  Musculoskeletal: Positive for myalgias.  Neurological: Positive for numbness (tingling).  All other systems reviewed and are negative.  Physical Exam Updated Vital Signs BP 98/63 (BP Location: Right Arm)   Pulse 71   Temp 98.6 F (37 C) (Oral)   Resp 17   Ht 6'  (1.829 m)   Wt 84.8 kg (187 lb)   SpO2 97%   BMI 25.36 kg/m   Physical Exam  Constitutional: He is oriented to person, place, and time. He appears well-developed and well-nourished. No distress.  HENT:  Head: Normocephalic and atraumatic.  Eyes: Conjunctivae and EOM are normal.  Neck: Neck supple. No tracheal deviation present.  Cardiovascular: Normal rate.   Pulmonary/Chest: Effort normal. No respiratory distress.  Musculoskeletal: Normal range of motion.  No CT or L-spine tenderness.  Tenderness to the left gluteus.  Distal sensation strength and motor function intact full active range of motion negative straight leg  Neurological: He is alert and oriented to person, place, and time.  Skin: Skin is warm and dry.  Psychiatric: He has a normal mood and affect. His behavior is normal.  Nursing note and vitals reviewed.  ED Treatments / Results  Labs (all labs ordered are listed, but only abnormal results are displayed) Labs Reviewed - No data to display  EKG  EKG Interpretation None       Radiology No results found.  Procedures Procedures (including critical care time)  DIAGNOSTIC STUDIES: Oxygen Saturation is 97% on RA, normal by my interpretation.    COORDINATION OF CARE: 4:05 PM Discussed treatment plan with pt at bedside and pt agreed to plan.  Medications Ordered in ED Medications  methocarbamol (ROBAXIN) tablet 500 mg (500 mg Oral Given 10/18/16 1701)     Initial Impression / Assessment and Plan / ED Course  I have reviewed the triage vital signs and the nursing notes.  Pertinent labs & imaging results that were available during my care of the patient were reviewed by me and considered in my medical decision making (see chart for details).      Final Clinical Impressions(s) / ED Diagnoses   Final diagnoses:  Left leg pain   36 year old male presents today with buttock pain.  Patient reports striking it on the side of the bed after originally not  recalling any sort injury.  He has no signs of trauma.  He has no distal neurological deficits.  No indication for imaging or further evaluation or management.  Symptom Medicare instructions given, follow-up information given.  Patient verbalized understanding and agreement to today's plan.  New Prescriptions Discharge Medication List as of 10/18/2016  4:46 PM    START taking these medications   Details  methocarbamol (ROBAXIN) 500 MG tablet Take 1 tablet (500 mg total) by mouth 2 (two) times daily., Starting Thu 10/18/2016, Print      I personally performed the services described in this documentation, which was scribed in my presence. The recorded information has been reviewed and is accurate.    Eyvonne Mechanic, PA-C 10/18/16 1821    Jerelyn Scott, MD 10/20/16 [phone]

## 2016-10-18 NOTE — ED Notes (Signed)
Woke with left buttock pain. No injury.

## 2016-10-18 NOTE — Discharge Instructions (Signed)
Please read attached information. If you experience any new or worsening signs or symptoms please return to the emergency room for evaluation. Please follow-up with your primary care provider or specialist as discussed. Please use medication prescribed only as directed and discontinue taking if you have any concerning signs or symptoms.   °

## 2016-11-10 ENCOUNTER — Encounter (HOSPITAL_COMMUNITY): Payer: Self-pay | Admitting: Emergency Medicine

## 2016-11-10 ENCOUNTER — Emergency Department (HOSPITAL_COMMUNITY)
Admission: EM | Admit: 2016-11-10 | Discharge: 2016-11-10 | Disposition: A | Discharge status: Home / Self Care | Payer: Medicaid Other | Attending: Emergency Medicine | Admitting: Emergency Medicine

## 2016-11-10 ENCOUNTER — Emergency Department (HOSPITAL_COMMUNITY): Payer: Medicaid Other

## 2016-11-10 DIAGNOSIS — F17210 Nicotine dependence, cigarettes, uncomplicated: Secondary | ICD-10-CM | POA: Insufficient documentation

## 2016-11-10 DIAGNOSIS — J4521 Mild intermittent asthma with (acute) exacerbation: Secondary | ICD-10-CM | POA: Diagnosis not present

## 2016-11-10 DIAGNOSIS — R0602 Shortness of breath: Secondary | ICD-10-CM | POA: Diagnosis present

## 2016-11-10 MED ORDER — ALBUTEROL SULFATE HFA 108 (90 BASE) MCG/ACT IN AERS
2.0000 | INHALATION_SPRAY | RESPIRATORY_TRACT | Status: DC | PRN
  Administered 2016-11-10: 2 via RESPIRATORY_TRACT
  Filled 2016-11-10: qty 6.7

## 2016-11-10 MED ORDER — PREDNISONE 20 MG PO TABS
60.0000 mg | ORAL_TABLET | Freq: Once | ORAL | Status: AC
  Administered 2016-11-10: 60 mg via ORAL
  Filled 2016-11-10: qty 3

## 2016-11-10 MED ORDER — ALBUTEROL SULFATE (2.5 MG/3ML) 0.083% IN NEBU
2.5000 mg | INHALATION_SOLUTION | Freq: Once | RESPIRATORY_TRACT | Status: AC
  Administered 2016-11-10: 2.5 mg via RESPIRATORY_TRACT
  Filled 2016-11-10: qty 3

## 2016-11-10 MED ORDER — ALBUTEROL SULFATE HFA 108 (90 BASE) MCG/ACT IN AERS: 2.0000 | INHALATION_SPRAY | RESPIRATORY_TRACT | 6 refills | Status: DC | PRN

## 2016-11-10 MED ORDER — IPRATROPIUM-ALBUTEROL 0.5-2.5 (3) MG/3ML IN SOLN
3.0000 mL | Freq: Once | RESPIRATORY_TRACT | Status: AC
Start: 2016-11-10 — End: 2016-11-10
  Administered 2016-11-10: 3 mL via RESPIRATORY_TRACT
  Filled 2016-11-10: qty 3

## 2016-11-10 MED ORDER — PREDNISONE 20 MG PO TABS: 60.0000 mg | ORAL_TABLET | Freq: Every day | ORAL | 0 refills | Status: DC

## 2016-11-10 NOTE — ED Triage Notes (Signed)
Pt to triage via [facility]> c/o sob all day.  History of asthma.  90% on RA on EMS arrival.  Pt received Albuterol 10 mg, Atrovent 1 mg, and Solu Medrol 125 mg IV pta.  Pt arrived to triage with breathing tx in progress still wheezing bilaterally.

## 2016-11-10 NOTE — ED Provider Notes (Signed)
[facility] Provider Note   CSN: [id number] Arrival date & time: 11/10/16  7253  By signing my name below, I, Joseph Huff, attest that this documentation has been prepared under the direction and in the presence of Pollina, Canary Brim, *. Electronically Signed: Karren Cobble, ED Scribe. 11/10/16. 2:57 AM.  History   Chief Complaint Chief Complaint  Patient presents with  . Shortness of Breath  . Asthma   The history is provided by the patient. No language interpreter was used.   HPI  HPI Comments: Joseph Huff is a 36 y.o. male with a PMHx of asthma, brought in by ambulance, who presents to the Emergency Department with sudden onset, gradually worsening, shortness of breath that began this morning. He notes associated cough with no production, subjective fever, chills, and diaphoresis. Pt endorses a history of asthma. No treatment tried PTA.    Past Medical History:  Diagnosis Date  . Asthma    Patient Active Problem List   Diagnosis Date Noted  . Foreign body of pharynx 08/12/2016  . Dysphagia 08/12/2016   Past Surgical History:  Procedure Laterality Date  . DIRECT LARYNGOSCOPY N/A 08/12/2016   Procedure: LARYNGOSCOPY AND CERVICAL ESOPHOGOSCOPY;  Surgeon: Osborn Coho, MD;  Location: [facility] OR;  Service: ENT;  Laterality: N/A;    Home Medications    Prior to Admission medications   Medication Sig Start Date End Date Taking? Authorizing Provider  albuterol (PROVENTIL HFA;VENTOLIN HFA) 108 (90 Base) MCG/ACT inhaler Inhale 2 puffs into the lungs every 4 (four) hours as needed for wheezing or shortness of breath. 11/10/16   Pollina, Canary Brim, MD  predniSONE (DELTASONE) 20 MG tablet Take 3 tablets (60 mg total) by mouth daily with breakfast. 11/10/16   Pollina, Canary Brim, MD   Family History No family history on file.  Social History Social History  Substance Use Topics  . Smoking status: Current Every Day Smoker    Types: Cigarettes    Last attempt to  quit: 08/01/2016  . Smokeless tobacco: Never Used  . Alcohol use Yes     Comment: Once a month   Allergies   Patient has no known allergies.  Review of Systems Review of Systems  Constitutional: Positive for chills, diaphoresis and fever.  Respiratory: Positive for cough and shortness of breath.   All other systems reviewed and are negative.  Physical Exam Updated Vital Signs BP 118/72   Pulse 83   Temp 98.1 F (36.7 C) (Oral)   Resp 16   SpO2 96%   Physical Exam  Constitutional: He is oriented to person, place, and time. He appears well-developed and well-nourished. No distress.  HENT:  Head: Normocephalic and atraumatic.  Right Ear: Hearing normal.  Left Ear: Hearing normal.  Nose: Nose normal.  Mouth/Throat: Oropharynx is clear and moist and mucous membranes are normal.  Eyes: Pupils are equal, round, and reactive to light. Conjunctivae and EOM are normal.  Neck: Normal range of motion. Neck supple.  Cardiovascular: Regular rhythm, S1 normal and S2 normal.  Exam reveals no gallop and no friction rub.   No murmur heard. Pulmonary/Chest: Effort normal. No respiratory distress. He has wheezes. He exhibits no tenderness.  Diminished lungs sounds bilaterally. Expiratory wheezing at base.   Abdominal: Soft. Normal appearance and bowel sounds are normal. There is no hepatosplenomegaly. There is no tenderness. There is no rebound, no guarding, no tenderness at McBurney's point and negative Murphy's sign. No hernia.  Musculoskeletal: Normal range of motion.  Neurological: He is  alert and oriented to person, place, and time. He has normal strength. No cranial nerve deficit or sensory deficit. Coordination normal. GCS eye subscore is 4. GCS verbal subscore is 5. GCS motor subscore is 6.  Skin: Skin is warm, dry and intact. No rash noted. No cyanosis.  Psychiatric: He has a normal mood and affect. His speech is normal and behavior is normal. Thought content normal.  Nursing note and  vitals reviewed.   ED Treatments / Results  DIAGNOSTIC STUDIES: Oxygen Saturation is 99% on RA, normal by my interpretation.   COORDINATION OF CARE: 2:54 AM-Discussed next steps with pt. Pt verbalized understanding and is agreeable with the plan.   Labs (all labs ordered are listed, but only abnormal results are displayed) Labs Reviewed - No data to display  EKG  EKG Interpretation  Date/Time:  Saturday November 10 2016 02:07:19 EDT Ventricular Rate:  89 PR Interval:  132 QRS Duration: 92 QT Interval:  360 QTC Calculation: 438 R Axis:   68 Text Interpretation:  Normal sinus rhythm Normal ECG Confirmed by Gilda Crease [phone]) on 11/10/2016 2:35:54 AM       Radiology Dg Chest Portable 1 View  Result Date: 11/10/2016 CLINICAL DATA:  Shortness of breath all day. History of asthma. Wheezing. EXAM: PORTABLE CHEST 1 VIEW COMPARISON:  09/16/2016 FINDINGS: Mild pulmonary hyperinflation. Normal heart size and pulmonary vascularity. No focal airspace disease or consolidation in the lungs. No blunting of costophrenic angles. No pneumothorax. Mediastinal contours appear intact. IMPRESSION: Mild hyperinflation.  No evidence of active pulmonary disease. Electronically Signed   By: Burman Nieves M.D.   On: 11/10/2016 02:57    Procedures Procedures (including critical care time)  Medications Ordered in ED Medications  ipratropium-albuterol (DUONEB) 0.5-2.5 (3) MG/3ML nebulizer solution 3 mL (not administered)  albuterol (PROVENTIL) (2.5 MG/3ML) 0.083% nebulizer solution 2.5 mg (not administered)  predniSONE (DELTASONE) tablet 60 mg (not administered)  albuterol (PROVENTIL HFA;VENTOLIN HFA) 108 (90 Base) MCG/ACT inhaler 2 puff (not administered)     Initial Impression / Assessment and Plan / ED Course  I have reviewed the triage vital signs and the nursing notes.  Pertinent labs & imaging results that were available during my care of the patient were reviewed by me and  considered in my medical decision making (see chart for details).     Patient with history of asthma presents to the emergency department with shortness of breath. He was given breathing treatment, steroids and is improved. Chest x-ray does not show any evidence of pneumonia. Currently his oxygenation is 96% on room air. Minimal residual wheezing. Patient will be discharged with albuterol inhaler, prednisone burst.  Final Clinical Impressions(s) / ED Diagnoses   Final diagnoses:  Mild intermittent asthma with exacerbation    New Prescriptions New Prescriptions   PREDNISONE (DELTASONE) 20 MG TABLET    Take 3 tablets (60 mg total) by mouth daily with breakfast.  I personally performed the services described in this documentation, which was scribed in my presence. The recorded information has been reviewed and is accurate.    Gilda Crease, MD 11/10/16 [phone]

## 2016-11-27 ENCOUNTER — Other Ambulatory Visit: Payer: Self-pay

## 2016-11-27 ENCOUNTER — Emergency Department (HOSPITAL_COMMUNITY): Payer: Medicaid Other

## 2016-11-27 ENCOUNTER — Observation Stay (HOSPITAL_COMMUNITY)
Admission: EM | Admit: 2016-11-27 | Discharge: 2016-11-28 | Disposition: A | Discharge status: Home / Self Care | Payer: Medicaid Other | Attending: Internal Medicine | Admitting: Internal Medicine

## 2016-11-27 ENCOUNTER — Encounter (HOSPITAL_COMMUNITY): Payer: Self-pay | Admitting: Emergency Medicine

## 2016-11-27 DIAGNOSIS — Z8249 Family history of ischemic heart disease and other diseases of the circulatory system: Secondary | ICD-10-CM | POA: Diagnosis not present

## 2016-11-27 DIAGNOSIS — J4521 Mild intermittent asthma with (acute) exacerbation: Secondary | ICD-10-CM | POA: Diagnosis not present

## 2016-11-27 DIAGNOSIS — R0602 Shortness of breath: Secondary | ICD-10-CM | POA: Diagnosis not present

## 2016-11-27 DIAGNOSIS — Z79899 Other long term (current) drug therapy: Secondary | ICD-10-CM | POA: Insufficient documentation

## 2016-11-27 DIAGNOSIS — F17210 Nicotine dependence, cigarettes, uncomplicated: Secondary | ICD-10-CM | POA: Insufficient documentation

## 2016-11-27 DIAGNOSIS — I429 Cardiomyopathy, unspecified: Secondary | ICD-10-CM | POA: Diagnosis not present

## 2016-11-27 DIAGNOSIS — F1410 Cocaine abuse, uncomplicated: Secondary | ICD-10-CM | POA: Insufficient documentation

## 2016-11-27 DIAGNOSIS — R0781 Pleurodynia: Secondary | ICD-10-CM | POA: Diagnosis present

## 2016-11-27 DIAGNOSIS — R079 Chest pain, unspecified: Secondary | ICD-10-CM | POA: Diagnosis not present

## 2016-11-27 DIAGNOSIS — F1110 Opioid abuse, uncomplicated: Secondary | ICD-10-CM | POA: Diagnosis not present

## 2016-11-27 DIAGNOSIS — I428 Other cardiomyopathies: Secondary | ICD-10-CM

## 2016-11-27 HISTORY — DX: Pneumonia, unspecified organism: J18.9

## 2016-11-27 HISTORY — DX: Panic disorder (episodic paroxysmal anxiety): F41.0

## 2016-11-27 LAB — I-STAT TROPONIN, ED: TROPONIN I, POC: 0.09 ng/mL — AB (ref 0.00–0.08)

## 2016-11-27 LAB — TROPONIN I
TROPONIN I: 0.07 ng/mL — AB (ref <0.03)
TROPONIN I: 0.06 ng/mL — AB (ref <0.03)
Troponin I: 0.07 ng/mL (ref <0.03)

## 2016-11-27 LAB — BASIC METABOLIC PANEL
ANION GAP: 10 (ref 5–15)
BUN: 12 mg/dL (ref 6–20)
CALCIUM: 8.9 mg/dL (ref 8.9–10.3)
CO2: 23 mmol/L (ref 22–32)
CREATININE: 1.08 mg/dL (ref 0.61–1.24)
Chloride: 107 mmol/L (ref 101–111)
GLUCOSE: 80 mg/dL (ref 65–99)
Potassium: 3.6 mmol/L (ref 3.5–5.1)
Sodium: 140 mmol/L (ref 135–145)

## 2016-11-27 LAB — RAPID URINE DRUG SCREEN, HOSP PERFORMED
Amphetamines: NOT DETECTED
Barbiturates: NOT DETECTED
Benzodiazepines: NOT DETECTED
Cocaine: POSITIVE — AB
Opiates: NOT DETECTED
Tetrahydrocannabinol: NOT DETECTED

## 2016-11-27 LAB — CBC
HCT: 44.9 % (ref 39.0–52.0)
HEMOGLOBIN: 15.1 g/dL (ref 13.0–17.0)
MCH: 28.1 pg (ref 26.0–34.0)
MCHC: 33.6 g/dL (ref 30.0–36.0)
MCV: 83.6 fL (ref 78.0–100.0)
PLATELETS: 194 10*3/uL (ref 150–400)
RBC: 5.37 MIL/uL (ref 4.22–5.81)
RDW: 13.7 % (ref 11.5–15.5)
WBC: 6 10*3/uL (ref 4.0–10.5)

## 2016-11-27 MED ORDER — HEPARIN (PORCINE) IN NACL 100-0.45 UNIT/ML-% IJ SOLN
1150.0000 [IU]/h | INTRAMUSCULAR | Status: DC
  Administered 2016-11-27: 18:00:00 1150 [IU]/h via INTRAVENOUS
  Filled 2016-11-27 (×2): qty 250

## 2016-11-27 MED ORDER — LORAZEPAM 2 MG/ML IJ SOLN
1.0000 mg | Freq: Once | INTRAMUSCULAR | Status: AC
  Administered 2016-11-27: 1 mg via INTRAVENOUS
  Filled 2016-11-27: qty 1

## 2016-11-27 MED ORDER — ALBUTEROL SULFATE (2.5 MG/3ML) 0.083% IN NEBU
3.0000 mL | INHALATION_SOLUTION | RESPIRATORY_TRACT | Status: DC | PRN
  Administered 2016-11-28: 3 mL via RESPIRATORY_TRACT
  Filled 2016-11-27: qty 3

## 2016-11-27 MED ORDER — ACETAMINOPHEN 325 MG PO TABS: 650.0000 mg | ORAL_TABLET | ORAL | Status: DC | PRN

## 2016-11-27 MED ORDER — ONDANSETRON HCL 4 MG/2ML IJ SOLN: 4.0000 mg | Freq: Four times a day (QID) | INTRAMUSCULAR | Status: DC | PRN

## 2016-11-27 MED ORDER — IPRATROPIUM-ALBUTEROL 0.5-2.5 (3) MG/3ML IN SOLN
3.0000 mL | Freq: Three times a day (TID) | RESPIRATORY_TRACT | Status: DC
  Administered 2016-11-27 – 2016-11-28 (×2): 3 mL via RESPIRATORY_TRACT
  Filled 2016-11-27: qty 3

## 2016-11-27 MED ORDER — NITROGLYCERIN 0.4 MG SL SUBL: 0.4000 mg | SUBLINGUAL_TABLET | SUBLINGUAL | Status: DC | PRN

## 2016-11-27 MED ORDER — ASPIRIN EC 325 MG PO TBEC
325.0000 mg | DELAYED_RELEASE_TABLET | Freq: Once | ORAL | Status: AC
  Administered 2016-11-27: 325 mg via ORAL
  Filled 2016-11-27: qty 1

## 2016-11-27 MED ORDER — ALBUTEROL SULFATE (2.5 MG/3ML) 0.083% IN NEBU: 2.5000 mg | INHALATION_SOLUTION | Freq: Four times a day (QID) | RESPIRATORY_TRACT | Status: DC

## 2016-11-27 MED ORDER — ASPIRIN EC 81 MG PO TBEC
81.0000 mg | DELAYED_RELEASE_TABLET | Freq: Every day | ORAL | Status: DC
  Administered 2016-11-28: 10:00:00 81 mg via ORAL
  Filled 2016-11-27: qty 1

## 2016-11-27 MED ORDER — HEPARIN BOLUS VIA INFUSION
4000.0000 [IU] | Freq: Once | INTRAVENOUS | Status: AC
  Administered 2016-11-27: 4000 [IU] via INTRAVENOUS
  Filled 2016-11-27: qty 4000

## 2016-11-27 MED ORDER — ALBUTEROL SULFATE (2.5 MG/3ML) 0.083% IN NEBU
3.0000 mL | INHALATION_SOLUTION | Freq: Four times a day (QID) | RESPIRATORY_TRACT | Status: DC | PRN
  Administered 2016-11-27: 3 mL via RESPIRATORY_TRACT
  Filled 2016-11-27: qty 3

## 2016-11-27 NOTE — Care Management Note (Addendum)
Case Management Note  Patient Details  Name: Joseph Huff MRN: [id number] Date of Birth: [birth year]  Subjective/Objective:   From home with a friend, pta indep, he has no PCP, has hx of psa,presents with chest pain, he also has asthma.  NCM scheduled follow up apt at [facility] clinic for 8/22 at 9:30.  He pays co payof 3.50 for medications.                Action/Plan: NCM will follow for dc needs.   Expected Discharge Date:                  Expected Discharge Plan:     In-House Referral:     Discharge planning Services  CM Consult  Post Acute Care Choice:    Choice offered to:     DME Arranged:    DME Agency:     HH Arranged:    HH Agency:     Status of Service:  In process, will continue to follow  If discussed at Long Length of Stay Meetings, dates discussed:    Additional Comments:  Leone Haven, RN 11/27/2016, 5:18 PM

## 2016-11-27 NOTE — ED Provider Notes (Signed)
[facility] Provider Note   CSN: [id number] Arrival date & time: 11/27/16  0417     History   Chief Complaint Chief Complaint  Patient presents with  . Shortness of Breath  . Chest Pain    HPI Joseph Huff is a 36 y.o. male with history of asthma, polysubstance abuse who presents with a one-day history of chest pain, shortness of breath, cough. Patient reports chest pressure radiating to his back. Symptoms are worsening a deep breath. He has been constant since yesterday morning. Patient reports using cocaine yesterday. He has not taken any medications at home for his symptoms. He reports associated nausea. He denies any fevers, abdominal pain, urinary symptoms. In addition to cocaine, patient also smokes marijuana intermittently.  HPI  Past Medical History:  Diagnosis Date  . Asthma     Patient Active Problem List   Diagnosis Date Noted  . Chest pain 11/27/2016  . Foreign body of pharynx 08/12/2016  . Dysphagia 08/12/2016    Past Surgical History:  Procedure Laterality Date  . DIRECT LARYNGOSCOPY N/A 08/12/2016   Procedure: LARYNGOSCOPY AND CERVICAL ESOPHOGOSCOPY;  Surgeon: Osborn Coho, MD;  Location: [facility] OR;  Service: ENT;  Laterality: N/A;       Home Medications    Prior to Admission medications   Medication Sig Start Date End Date Taking? Authorizing Provider  albuterol (PROVENTIL HFA;VENTOLIN HFA) 108 (90 Base) MCG/ACT inhaler Inhale 2 puffs into the lungs every 4 (four) hours as needed for wheezing or shortness of breath. 11/10/16  Yes Pollina, Canary Brim, MD  predniSONE (DELTASONE) 20 MG tablet Take 3 tablets (60 mg total) by mouth daily with breakfast. Patient not taking: Reported on 11/27/2016 11/10/16   Gilda Crease, MD    Family History Family History  Problem Relation Age of Onset  . Hypertension Maternal Uncle   . Diabetes Maternal Uncle     Social History Social History  Substance Use Topics  . Smoking status: Current Every  Day Smoker    Types: Cigarettes    Last attempt to quit: 08/01/2016  . Smokeless tobacco: Never Used  . Alcohol use Yes     Comment: Once a month     Allergies   Patient has no known allergies.   Review of Systems Review of Systems  Constitutional: Negative for chills and fever.  HENT: Negative for facial swelling and sore throat.   Respiratory: Positive for shortness of breath.   Cardiovascular: Positive for chest pain.  Gastrointestinal: Negative for abdominal pain, nausea and vomiting.  Genitourinary: Negative for dysuria.  Musculoskeletal: Negative for back pain.  Skin: Negative for rash and wound.  Neurological: Negative for headaches.  Psychiatric/Behavioral: The patient is not nervous/anxious.      Physical Exam Updated Vital Signs BP 111/81   Pulse 66   Temp (!) 97.5 F (36.4 C) (Oral)   Resp 19   Ht 5\' 11"  (1.803 m)   Wt 84.8 kg (187 lb)   SpO2 96%   BMI 26.08 kg/m   Physical Exam  Constitutional: He appears well-developed and well-nourished. No distress.  HENT:  Head: Normocephalic and atraumatic.  Mouth/Throat: Oropharynx is clear and moist. No oropharyngeal exudate.  Eyes: Pupils are equal, round, and reactive to light. Conjunctivae are normal. Right eye exhibits no discharge. Left eye exhibits no discharge. No scleral icterus.  Neck: Normal range of motion. Neck supple. No thyromegaly present.  Cardiovascular: Normal rate, regular rhythm, normal heart sounds and intact distal pulses.  Exam reveals no  gallop and no friction rub.   No murmur heard. Pulmonary/Chest: Effort normal and breath sounds normal. No stridor. No respiratory distress. He has no wheezes. He has no rales.  Abdominal: Soft. Bowel sounds are normal. He exhibits no distension. There is no tenderness. There is no rebound and no guarding.  Musculoskeletal: He exhibits no edema.       Back:  Lymphadenopathy:    He has no cervical adenopathy.  Neurological: He is alert. Coordination  normal.  Skin: Skin is warm and dry. No rash noted. He is not diaphoretic. No pallor.  Psychiatric: He has a normal mood and affect.  Nursing note and vitals reviewed.    ED Treatments / Results  Labs (all labs ordered are listed, but only abnormal results are displayed) Labs Reviewed  TROPONIN I - Abnormal; Notable for the following:       Result Value   Troponin I 0.07 (*)    All other components within normal limits  RAPID URINE DRUG SCREEN, HOSP PERFORMED - Abnormal; Notable for the following:    Cocaine POSITIVE (*)    All other components within normal limits  I-STAT TROPONIN, ED - Abnormal; Notable for the following:    Troponin i, poc 0.09 (*)    All other components within normal limits  BASIC METABOLIC PANEL  CBC    EKG  EKG Interpretation  Date/Time:  Tuesday November 27 2016 04:38:49 EDT Ventricular Rate:  91 PR Interval:  128 QRS Duration: 98 QT Interval:  372 QTC Calculation: 457 R Axis:   83 Text Interpretation:  Normal sinus rhythm Normal ECG ?pr depression Confirmed by Zadie Rhine ([id number]) on 11/27/2016 6:37:00 AM Also confirmed by Zadie Rhine ([id number]), editor Madalyn Rob [phone])  on 11/27/2016 7:26:26 AM       Radiology Dg Chest 2 View  Result Date: 11/27/2016 CLINICAL DATA:  Acute onset of cough, congestion and shortness of breath. Generalized chest pain. Initial encounter. EXAM: CHEST  2 VIEW COMPARISON:  Chest radiograph performed 11/10/2016 FINDINGS: The lungs are well-aerated and clear. There is no evidence of focal opacification, pleural effusion or pneumothorax. The heart is normal in size; the mediastinal contour is within normal limits. No acute osseous abnormalities are seen. A metallic piercing is noted overlying the left nipple shadow. IMPRESSION: No acute cardiopulmonary process seen. Electronically Signed   By: Roanna Raider M.D.   On: 11/27/2016 05:26    Procedures Procedures (including critical care time)  Medications  Ordered in ED Medications  aspirin EC tablet 325 mg (325 mg Oral Given 11/27/16 9147)     Initial Impression / Assessment and Plan / ED Course  I have reviewed the triage vital signs and the nursing notes.  Pertinent labs & imaging results that were available during my care of the patient were reviewed by me and considered in my medical decision making (see chart for details).     PERC negative. I-STAT troponin 0.09, troponin I is 0.07. Otherwise, labs unremarkable. CXR shows no active cardiopulmonary disease. UDS positive for cocaine. I consulted Dr. Rennis Golden with cardiology who will evaluate the patient. Aspirin 325mg  initiated in the ED.  After evaluation by cardiology, patient will be admitted for echocardiogram and cycling of troponins. Consider coronary spasm secondary to cocaine use. Patient understands and agrees with plan. Patient vitals stable throughout ED course. Patient also evaluated by Dr. Bebe Shaggy who guided the patient's management and agrees with plan.   Final Clinical Impressions(s) / ED Diagnoses   Final diagnoses:  Chest pain, unspecified type  Shortness of breath    New Prescriptions New Prescriptions   No medications on file       Verdis Prime 11/27/16 1610    Zadie Rhine, MD 11/28/16 [phone]

## 2016-11-27 NOTE — Progress Notes (Signed)
ANTICOAGULATION CONSULT NOTE - Initial Consult  Pharmacy Consult for heparin Indication: chest pain/ACS  No Known Allergies  Patient Measurements: Height: 5\' 11"  (180.3 cm) Weight: 187 lb (84.8 kg) IBW/kg (Calculated) : 75.3 Heparin Dosing Weight: 85  Vital Signs: Temp: 98.3 F (36.8 C) (08/14 1647) Temp Source: Oral (08/14 1647) BP: 106/66 (08/14 1647) Pulse Rate: 75 (08/14 1647)  Labs:  Recent Labs  11/27/16 0426 11/27/16 1332  HGB 15.1  --   HCT 44.9  --   PLT 194  --   CREATININE 1.08  --   TROPONINI 0.07* 0.07*    Estimated Creatinine Clearance: 99.7 mL/min (by C-G formula based on SCr of 1.08 mg/dL).   Medical History: Past Medical History:  Diagnosis Date  . Asthma     Medications:  Scheduled:  . albuterol  2.5 mg Nebulization Q6H  . aspirin EC  81 mg Oral Daily    Assessment: 36yo male with chest pain & increased Troponin s/p Cocaine use on 8/13, to start Heparin.  Hg and pltc wnl.  Goal of Therapy:  Heparin level 0.3-0.7 units/ml Monitor platelets by anticoagulation protocol: Yes   Plan:  Heparin 4000 units IV x 1, then 1150 units/hr Heparin level 6hr Daily heparin level, CBC Watch for s/s of bleeding   Marisue Humble, B.S., PharmD Clinical Pharmacist [facility]

## 2016-11-27 NOTE — ED Notes (Signed)
Cards PA [place] at bedside to see patient

## 2016-11-27 NOTE — H&P (Signed)
Cardiology History and Physical    Patient ID: Joseph Huff MRN: [id number], DOB/AGE: 11-06-1980   Admit date: 11/27/2016 Date of Consult: 11/27/2016  Primary Physician: System, Provider Not In Reason for Consult: Chest Pain Primary Cardiologist: New to Joseph Huff - Dr. Rennis Huff  Patient Profile    Joseph Huff is a 36 y.o. male with past medical history of asthma, substance use (Cocaine and THC) and no prior cardiac history who is being seen today for the evaluation of chest pain at the request of Dr. Bebe Huff.   History of Present Illness    He has been evaluated in the ED 5 times over the past 3 months, of which 4 visits have been for shortness of breath thought to be secondary to asthma exacerbations.   He presented to the ED this morning reporting chest pain which started yesterday morning. He reports having shortness of breath starting yesterday morning which was associated with a productive cough. He developed a stabbing pain along his left pectoral region yesterday evening which he describes as a shooting pain lasting for seconds at a time. Pain was not worse with exertion. He reports a mild pain at this time.   He denies any prior cardiac history. He is active at baseline and runs on the treadmill multiple times per week and goes to the batting cages. He is unaware of any family history of CAD. Does have a history of substance use including tobacco use and Cocaine use. He last used Cocaine yesterday morning and reports using every 2-3 days.  Initial labs show WBC of 6.0, Hgb 15.1, platelets 194, K+ 3.6, and creatinine 1.08. Troponin I positive at 0.07 with I-STAT at 0.09. CXR with no acute cardiopulmonary processes. EKG shows NSR, HR 91, with no acute ST or T-wave changes.   Past Medical History   Past Medical History:  Diagnosis Date  . Asthma     Past Surgical History:  Procedure Laterality Date  . DIRECT LARYNGOSCOPY N/A 08/12/2016   Procedure: LARYNGOSCOPY AND CERVICAL  ESOPHOGOSCOPY;  Surgeon: Joseph Coho, MD;  Location[facility] OR;  Service: ENT;  Laterality: N/A;     Allergies  No Known Allergies  Inpatient Medications      Family History    Family History  Problem Relation Age of Onset  . Hypertension Maternal Uncle   . Diabetes Maternal Uncle     Social History    Social History   Social History  . Marital status: Single    Spouse name: N/A  . Number of children: N/A  . Years of education: N/A   Occupational History  . Not on file.   Social History Main Topics  . Smoking status: Current Every Day Smoker    Types: Cigarettes    Last attempt to quit: 08/01/2016  . Smokeless tobacco: Never Used  . Alcohol use Yes     Comment: Once a month  . Drug use: Yes    Types: Marijuana, Cocaine     Comment: Unknown of how often.   Marland Kitchen Sexual activity: Not on file   Other Topics Concern  . Not on file   Social History Narrative  . No narrative on file     Review of Systems    General:  No chills, fever, night sweats or weight changes.  Cardiovascular:  No dyspnea on exertion, edema, orthopnea, palpitations, paroxysmal nocturnal dyspnea. Positive for chest pain and dyspnea.  Dermatological: No rash, lesions/masses Respiratory: Positive for cough and dyspnea Urologic: No hematuria,  dysuria Abdominal:   No nausea, vomiting, diarrhea, bright red blood per rectum, melena, or hematemesis Neurologic:  No visual changes, wkns, changes in mental status. All other systems reviewed and are otherwise negative except as noted above.  Physical Exam    Blood pressure 107/78, pulse 73, temperature (!) 97.5 F (36.4 C), temperature source Oral, resp. rate 17, height 5\' 11"  (1.803 m), weight 187 lb (84.8 kg), SpO2 96 %.  General: Pleasant male appearing in NAD Psych: Normal affect. Neuro: Alert and oriented X 3. Moves all extremities spontaneously. HEENT: Normal  Neck: Supple without bruits or JVD. Lungs:  Resp regular and unlabored, decreased  breath sounds along bases bilaterally. Heart: RRR no s3, s4, or murmurs. Abdomen: Soft, non-tender, non-distended, BS + x 4.  Extremities: No clubbing, cyanosis or edema. DP/PT/Radials 2+ and equal bilaterally.  Labs    Troponin [facility] Test)  Recent Labs  11/27/16 0438  TROPIPOC 0.09*    Recent Labs  11/27/16 0426  TROPONINI 0.07*   Lab Results  Component Value Date   WBC 6.0 11/27/2016   HGB 15.1 11/27/2016   HCT 44.9 11/27/2016   MCV 83.6 11/27/2016   PLT 194 11/27/2016     Recent Labs Lab 11/27/16 0426  NA 140  K 3.6  CL 107  CO2 23  BUN 12  CREATININE 1.08  CALCIUM 8.9  GLUCOSE 80   No results found for: CHOL, HDL, LDLCALC, TRIG No results found for: [facility]   Radiology Studies    Dg Chest 2 View  Result Date: 11/27/2016 CLINICAL DATA:  Acute onset of cough, congestion and shortness of breath. Generalized chest pain. Initial encounter. EXAM: CHEST  2 VIEW COMPARISON:  Chest radiograph performed 11/10/2016 FINDINGS: The lungs are well-aerated and clear. There is no evidence of focal opacification, pleural effusion or pneumothorax. The heart is normal in size; the mediastinal contour is within normal limits. No acute osseous abnormalities are seen. A metallic piercing is noted overlying the left nipple shadow. IMPRESSION: No acute cardiopulmonary process seen. Electronically Signed   By: Roanna Raider M.D.   On: 11/27/2016 05:26   Dg Chest Portable 1 View  Result Date: 11/10/2016 CLINICAL DATA:  Shortness of breath all day. History of asthma. Wheezing. EXAM: PORTABLE CHEST 1 VIEW COMPARISON:  09/16/2016 FINDINGS: Mild pulmonary hyperinflation. Normal heart size and pulmonary vascularity. No focal airspace disease or consolidation in the lungs. No blunting of costophrenic angles. No pneumothorax. Mediastinal contours appear intact. IMPRESSION: Mild hyperinflation.  No evidence of active pulmonary disease. Electronically Signed   By: Burman Nieves M.D.    On: 11/10/2016 02:57    EKG & Cardiac Imaging    EKG: NSR, HR 91, with no acute ST or T-wave changes.  - Personally Reviewed  Echocardiogram: None on File  Assessment & Plan    Joseph Huff is a 36 y.o. male with past medical history of asthma, substance use (Cocaine and THC) and no prior cardiac history who is being seen today for the evaluation of chest pain at the request of Dr. Bebe Huff.   History of Present Illness    1. Chest Pain/ Elevated Troponin - reports developing chest pain yesterday evening which was worse with coughing and not exacerbated with exertion. Describes it as a shooting pain which would only last for seconds at a time.  - his symptoms seem more pleuritic in etiology but there is concern for coronary spasm in the setting of known cocaine use.  - Troponin I  positive at 0.07 with I-STAT at 0.09. EKG shows NSR, HR 91, with no acute ST or T-wave changes when compared to prior tracings.  - continue to cycle cardiac enzymes. Will obtain an echocardiogram to assess EF and wall motion. Pending troponin trend and echo results, consider further ischemic evaluation at that time.  - start Heparin at this time along with ASA. FLP pending. No BB secondary to Cocaine use.   2. Asthma Exacerbation - reports worsening dyspnea over the past few days. Has a history of multiple asthma exacerbations. - restart Albuterol nebulizers.  3. Substance Use - reports using Cocaine for the past 10+ years. Cessation advised.   Signed, Ellsworth Lennox, PA-C 11/27/2016, 8:15 AM Pager: [phone]

## 2016-11-27 NOTE — ED Notes (Signed)
Pt continues to c/o chest pain, ST elevation present on cardiac monitor, EKG captured and given to Dr. Juleen China

## 2016-11-27 NOTE — ED Triage Notes (Signed)
Pt brought to ED by [facility] for Respiratory distress and cp, per pt is bee diagnose with upper respiratory infection and sent home on abx and prednisone, pt still getting resp distress.

## 2016-11-28 ENCOUNTER — Encounter (HOSPITAL_COMMUNITY): Payer: Self-pay | Admitting: *Deleted

## 2016-11-28 ENCOUNTER — Observation Stay (HOSPITAL_COMMUNITY): Payer: Medicaid Other

## 2016-11-28 ENCOUNTER — Observation Stay (HOSPITAL_BASED_OUTPATIENT_CLINIC_OR_DEPARTMENT_OTHER): Payer: Medicaid Other

## 2016-11-28 DIAGNOSIS — R079 Chest pain, unspecified: Secondary | ICD-10-CM | POA: Diagnosis not present

## 2016-11-28 DIAGNOSIS — I428 Other cardiomyopathies: Secondary | ICD-10-CM

## 2016-11-28 DIAGNOSIS — R0602 Shortness of breath: Secondary | ICD-10-CM

## 2016-11-28 DIAGNOSIS — J4521 Mild intermittent asthma with (acute) exacerbation: Secondary | ICD-10-CM | POA: Diagnosis not present

## 2016-11-28 DIAGNOSIS — I429 Cardiomyopathy, unspecified: Secondary | ICD-10-CM | POA: Diagnosis not present

## 2016-11-28 DIAGNOSIS — R0781 Pleurodynia: Secondary | ICD-10-CM | POA: Diagnosis not present

## 2016-11-28 LAB — ECHOCARDIOGRAM COMPLETE
Height: 71 in
Weight: 2737.23 oz

## 2016-11-28 LAB — CBC
HEMATOCRIT: 41.3 % (ref 39.0–52.0)
HEMOGLOBIN: 13.8 g/dL (ref 13.0–17.0)
MCH: 28.1 pg (ref 26.0–34.0)
MCHC: 33.4 g/dL (ref 30.0–36.0)
MCV: 84.1 fL (ref 78.0–100.0)
Platelets: 180 10*3/uL (ref 150–400)
RBC: 4.91 MIL/uL (ref 4.22–5.81)
RDW: 13.7 % (ref 11.5–15.5)
WBC: 6.5 10*3/uL (ref 4.0–10.5)

## 2016-11-28 LAB — LIPID PANEL
CHOL/HDL RATIO: 2.4 ratio
CHOLESTEROL: 139 mg/dL (ref 0–200)
HDL: 59 mg/dL (ref >40)
LDL Cholesterol: 71 mg/dL (ref 0–99)
Triglycerides: 47 mg/dL (ref <150)
VLDL: 9 mg/dL (ref 0–40)

## 2016-11-28 LAB — BASIC METABOLIC PANEL
ANION GAP: 6 (ref 5–15)
BUN: 15 mg/dL (ref 6–20)
CALCIUM: 8.7 mg/dL — AB (ref 8.9–10.3)
CO2: 26 mmol/L (ref 22–32)
Chloride: 106 mmol/L (ref 101–111)
Creatinine, Ser: 1.18 mg/dL (ref 0.61–1.24)
Glucose, Bld: 118 mg/dL — ABNORMAL HIGH (ref 65–99)
POTASSIUM: 3.6 mmol/L (ref 3.5–5.1)
SODIUM: 138 mmol/L (ref 135–145)

## 2016-11-28 LAB — HIV ANTIBODY (ROUTINE TESTING W REFLEX): HIV SCREEN 4TH GENERATION: NONREACTIVE

## 2016-11-28 LAB — HEPARIN LEVEL (UNFRACTIONATED): Heparin Unfractionated: 0.39 IU/mL (ref 0.30–0.70)

## 2016-11-28 LAB — TROPONIN I: TROPONIN I: 0.07 ng/mL — AB (ref <0.03)

## 2016-11-28 MED ORDER — FLUTICASONE-SALMETEROL 250-50 MCG/DOSE IN AEPB: 1.0000 | INHALATION_SPRAY | Freq: Two times a day (BID) | RESPIRATORY_TRACT | 0 refills | Status: AC

## 2016-11-28 MED ORDER — ALBUTEROL SULFATE HFA 108 (90 BASE) MCG/ACT IN AERS: 2.0000 | INHALATION_SPRAY | RESPIRATORY_TRACT | 0 refills | Status: AC | PRN

## 2016-11-28 MED ORDER — LOSARTAN POTASSIUM 25 MG PO TABS: 12.5000 mg | ORAL_TABLET | Freq: Every day | ORAL | 5 refills | Status: DC

## 2016-11-28 MED ORDER — PERFLUTREN LIPID MICROSPHERE
1.0000 mL | INTRAVENOUS | Status: AC | PRN
  Administered 2016-11-28: 12:00:00 2 mL via INTRAVENOUS
  Filled 2016-11-28: qty 10

## 2016-11-28 MED ORDER — IOPAMIDOL (ISOVUE-370) INJECTION 76%
INTRAVENOUS | Status: AC
  Filled 2016-11-28: qty 100

## 2016-11-28 MED ORDER — IOPAMIDOL (ISOVUE-370) INJECTION 76%
INTRAVENOUS | Status: AC
  Administered 2016-11-28: 12:00:00 80 mL
  Filled 2016-11-28: qty 100

## 2016-11-28 MED ORDER — LOSARTAN POTASSIUM 25 MG PO TABS: 12.5000 mg | ORAL_TABLET | Freq: Every day | ORAL | Status: DC

## 2016-11-28 MED ORDER — ALBUTEROL SULFATE (2.5 MG/3ML) 0.083% IN NEBU: 2.5000 mg | INHALATION_SOLUTION | Freq: Four times a day (QID) | RESPIRATORY_TRACT | Status: DC | PRN

## 2016-11-28 MED ORDER — METOPROLOL TARTRATE 5 MG/5ML IV SOLN
5.0000 mg | Freq: Once | INTRAVENOUS | Status: AC
  Administered 2016-11-28: 5 mg via INTRAVENOUS

## 2016-11-28 MED ORDER — METOPROLOL TARTRATE 5 MG/5ML IV SOLN
INTRAVENOUS | Status: AC
  Administered 2016-11-28: 5 mg via INTRAVENOUS
  Filled 2016-11-28: qty 5

## 2016-11-28 MED ORDER — IPRATROPIUM-ALBUTEROL 0.5-2.5 (3) MG/3ML IN SOLN: 3.0000 mL | Freq: Two times a day (BID) | RESPIRATORY_TRACT | Status: DC

## 2016-11-28 MED ORDER — NITROGLYCERIN 0.4 MG SL SUBL
0.4000 mg | SUBLINGUAL_TABLET | Freq: Once | SUBLINGUAL | Status: AC
  Administered 2016-11-28: 0.4 mg via SUBLINGUAL

## 2016-11-28 MED ORDER — ASPIRIN 81 MG PO TBEC: 81.0000 mg | DELAYED_RELEASE_TABLET | Freq: Every day | ORAL | Status: AC

## 2016-11-28 MED ORDER — NITROGLYCERIN 0.4 MG SL SUBL
SUBLINGUAL_TABLET | SUBLINGUAL | Status: AC
  Administered 2016-11-28: 0.4 mg via SUBLINGUAL
  Filled 2016-11-28: qty 1

## 2016-11-28 NOTE — Progress Notes (Signed)
Chaplain visited with patient via consult for prayer.  Patient recently accepted the Lord into his life and feels like some difficult things have come up since making that choice.  Chaplain allowed patient to speak freely about what was happening in his life.  Patient expresses wanting to get closer to the [facility] and wanting to do what he is supposed to do.  Chaplain and patient prayed regarding these things.  Chaplain provided ministry of presence and a safe space for patient to reflect on his feelings.  Chaplains are available should we be needed for this patient.    11/28/16 2297  Clinical Encounter Type  Visited With Patient  Visit Type Initial;Psychological support;Spiritual support;Social support  Referral From Patient  Consult/Referral To Chaplain  Spiritual Encounters  Spiritual Needs Prayer

## 2016-11-28 NOTE — Progress Notes (Signed)
Progress Note  Patient Name: Joseph Huff Date of Encounter: 11/28/2016  Primary Cardiologist: New - Dr. Rennis Golden  Subjective   Reports having mild chest pain this morning, relieved with Nebulizer treatment. Breathing now at baseline.   Inpatient Medications    Scheduled Meds: . aspirin EC  81 mg Oral Daily  . ipratropium-albuterol  3 mL Nebulization BID   Continuous Infusions:  PRN Meds: acetaminophen, albuterol, nitroGLYCERIN, ondansetron (ZOFRAN) IV   Vital Signs    Vitals:   11/27/16 2008 11/28/16 0000 11/28/16 0612 11/28/16 0730  BP: 108/63  105/72 113/61  Pulse: 88 100 79 89  Resp: 17 20 18 20   Temp: 98.8 F (37.1 C)  97.6 F (36.4 C) 97.6 F (36.4 C)  TempSrc: Oral  Oral Oral  SpO2: 95% 100% 93% 98%  Weight:   171 lb 1.2 oz (77.6 kg)   Height:        Intake/Output Summary (Last 24 hours) at 11/28/16 0911 Last data filed at 11/28/16 0700  Gross per 24 hour  Intake           992.57 ml  Output              800 ml  Net           192.57 ml   Filed Weights   11/27/16 0428 11/28/16 0612  Weight: 187 lb (84.8 kg) 171 lb 1.2 oz (77.6 kg)    Telemetry    NSR, HR in the 60's - 80's. - Personally Reviewed  ECG    NSR, HR 66, with peaked T-waves (similar to prior tracings). - Personally Reviewed  Physical Exam   General: Well developed, well nourished, male appearing in no acute distress. Head: Normocephalic, atraumatic.  Neck: Supple without bruits, JVD not elevated. Lungs:  Resp regular and unlabored, mild expiratory wheezing along upper lung fields bilaterally. Heart: RRR, S1, S2, no S3, S4, or murmur; no rub. Abdomen: Soft, non-tender, non-distended with normoactive bowel sounds. No hepatomegaly. No rebound/guarding. No obvious abdominal masses. Extremities: No clubbing, cyanosis, or edema. Distal pedal pulses are 2+ bilaterally. Neuro: Alert and oriented X 3. Moves all extremities spontaneously. Psych: Normal affect.  Labs    Chemistry Recent  Labs Lab 11/27/16 0426 11/28/16 0046  NA 140 138  K 3.6 3.6  CL 107 106  CO2 23 26  GLUCOSE 80 118*  BUN 12 15  CREATININE 1.08 1.18  CALCIUM 8.9 8.7*  GFRNONAA >60 >60  GFRAA >60 >60  ANIONGAP 10 6     Hematology Recent Labs Lab 11/27/16 0426 11/28/16 0046  WBC 6.0 6.5  RBC 5.37 4.91  HGB 15.1 13.8  HCT 44.9 41.3  MCV 83.6 84.1  MCH 28.1 28.1  MCHC 33.6 33.4  RDW 13.7 13.7  PLT 194 180    Cardiac Enzymes Recent Labs Lab 11/27/16 0426 11/27/16 1332 11/27/16 1857 11/28/16 0046  TROPONINI 0.07* 0.07* 0.06* 0.07*    Recent Labs Lab 11/27/16 0438  TROPIPOC 0.09*     BNPNo results for input(s): BNP, PROBNP in the last 168 hours.   DDimer No results for input(s): DDIMER in the last 168 hours.   Radiology    Dg Chest 2 View  Result Date: 11/27/2016 CLINICAL DATA:  Acute onset of cough, congestion and shortness of breath. Generalized chest pain. Initial encounter. EXAM: CHEST  2 VIEW COMPARISON:  Chest radiograph performed 11/10/2016 FINDINGS: The lungs are well-aerated and clear. There is no evidence of focal opacification, pleural effusion or pneumothorax.  The heart is normal in size; the mediastinal contour is within normal limits. No acute osseous abnormalities are seen. A metallic piercing is noted overlying the left nipple shadow. IMPRESSION: No acute cardiopulmonary process seen. Electronically Signed   By: Roanna Raider M.D.   On: 11/27/2016 05:26    Cardiac Studies   Echocardiogram: Pending  Patient Profile     36 y.o. male with past medical history of asthma, substance use (Cocaine and THC) and no prior cardiac history who presented to [facility] ED on 11/27/2016 for evaluation of chest pain.   Assessment & Plan    1. Chest Pain/ Elevated Troponin - reports developing chest pain the evening prior to his admission which was worse with coughing and not exacerbated with exertion. Describes it as a shooting pain which would only last for seconds at  a time.  - his symptoms seem more pleuritic in etiology but there is concern for coronary spasm in the setting of known cocaine use.  - Troponin I positive at 0.07 with repeat values flat at 0.06 and 0.07. Will discontinue Heparin with flat-trend. EKG without acute changes. Lipid Panel with LDL of 71. - echo is pending. Initial plan was for a NST this morning but he consumed breakfast, therefore will order a Coronary CT.  If testing is without acute findings, plan for discharge later today. Continue ASA. No BB secondary to Cocaine use.    2. Asthma Exacerbation - reports worsening dyspnea over the past few days. Has a history of multiple asthma exacerbations. - receiving Albuterol nebulizers. On Albuterol inhaler as an outpatient.  - recommended he establish with a PCP (Social Work has been consulted).   3. Substance Use - reports using Cocaine for the past 10+ years. Cessation advised.   Signed, Ellsworth Lennox , PA-C 9:11 AM 11/28/2016 Pager: [phone]

## 2016-11-28 NOTE — Clinical Social Work Note (Signed)
Patient medically stable for discharge today and call received from [facility] nurse Vernona Rieger requesting bus pass for patient. Bus pass provided and CSW signing off as no other intervention services needed.  Genelle Bal, MSW, LCSW Licensed Clinical Social Worker Clinical Social Work Department [employer] [phone]

## 2016-11-28 NOTE — Discharge Summary (Signed)
Discharge Summary    Patient ID: Joseph Huff,  MRN: [id number], DOB/AGE: April 09, 1981 36 y.o.  Admit date: 11/27/2016 Discharge date: 11/28/2016  Primary Care Provider: Patient, No Pcp Per Primary Cardiologist: New to [facility] - Dr. Rennis Golden  Discharge Diagnoses    Principal Problem:   Pleuritic chest pain Active Problems:   Mild intermittent asthma with exacerbation   Nonischemic cardiomyopathy (HCC)   History of Present Illness     Joseph Huff is a 36 y.o. male with past medical history of asthma, substance use (Cocaine and THC) and no prior cardiac history who is being seen today for the evaluation of chest pain at the request of Dr. Bebe Shaggy.   He has been evaluated in the ED 5 times over the past 3 months, of which 4 visits have been for shortness of breath thought to be secondary to asthma exacerbations.   He presented to the ED on 11/27/2016 reporting chest pain which started the morning prior to his presentation. He noted an associated productive cough. He developed a stabbing pain along his left pectoral region which he described as a shooting pain lasting for seconds at a time. Pain was not worse with exertion.   Initial labs showed WBC of 6.0, Hgb 15.1, platelets 194, K+ 3.6, and creatinine 1.08. Troponin I positive at 0.07 with I-STAT at 0.09. CXR with no acute cardiopulmonary processes. EKG showed NSR, HR 91, with no acute ST or T-wave changes. He was started on Heparin and admitted for further evaluation.   Hospital Course     Consultants: None   The following morning, he reported having mild chest discomfort which was relieved with a nebulizer treatment. Cardiac enzymes remained flat at 0.07. A Coronary CT was obtained and showed a coronary calcium score of 0 suggesting low-risk for future cardiac events. No plaque or stenosis was noted along the coronary arteries. No significant lung findings were noted. Echo showed a reduced EF of 35-40% with diffuse HK and no significant  valve abnormalities.  He was last examined by Dr. Rennis Golden and deemed stable for discharge. He was started on Losartan for his cardiomyopathy. Was not placed on BB therapy with known Cocaine use. Cardiology follow-up was arranged along with a PCP appointment to follow-up in regards to his asthma. He was discharged home in stable condition.    _____________  Discharge Vitals Blood pressure 100/62, pulse 74, temperature (!) 97.4 F (36.3 C), temperature source Oral, resp. rate 18, height 5\' 11"  (1.803 m), weight 171 lb 1.2 oz (77.6 kg), SpO2 93 %.  Filed Weights   11/27/16 0428 11/28/16 0612  Weight: 187 lb (84.8 kg) 171 lb 1.2 oz (77.6 kg)    Labs & Radiologic Studies     CBC  Recent Labs  11/27/16 0426 11/28/16 0046  WBC 6.0 6.5  HGB 15.1 13.8  HCT 44.9 41.3  MCV 83.6 84.1  PLT 194 180   Basic Metabolic Panel  Recent Labs  11/27/16 0426 11/28/16 0046  NA 140 138  K 3.6 3.6  CL 107 106  CO2 23 26  GLUCOSE 80 118*  BUN 12 15  CREATININE 1.08 1.18  CALCIUM 8.9 8.7*   Liver Function Tests No results for input(s): AST, ALT, ALKPHOS, BILITOT, PROT, ALBUMIN in the last 72 hours. No results for input(s): LIPASE, AMYLASE in the last 72 hours. Cardiac Enzymes  Recent Labs  11/27/16 1332 11/27/16 1857 11/28/16 0046  TROPONINI 0.07* 0.06* 0.07*   BNP Invalid input(s): POCBNP D-Dimer  No results for input(s): DDIMER in the last 72 hours. Hemoglobin A1C No results for input(s): HGBA1C in the last 72 hours. Fasting Lipid Panel  Recent Labs  11/28/16 0046  CHOL 139  HDL 59  LDLCALC 71  TRIG 47  CHOLHDL 2.4   Thyroid Function Tests No results for input(s): TSH, T4TOTAL, T3FREE, THYROIDAB in the last 72 hours.  Invalid input(s): FREET3  Dg Chest 2 View  Result Date: 11/27/2016 CLINICAL DATA:  Acute onset of cough, congestion and shortness of breath. Generalized chest pain. Initial encounter. EXAM: CHEST  2 VIEW COMPARISON:  Chest radiograph performed  11/10/2016 FINDINGS: The lungs are well-aerated and clear. There is no evidence of focal opacification, pleural effusion or pneumothorax. The heart is normal in size; the mediastinal contour is within normal limits. No acute osseous abnormalities are seen. A metallic piercing is noted overlying the left nipple shadow. IMPRESSION: No acute cardiopulmonary process seen. Electronically Signed   By: Roanna Raider M.D.   On: 11/27/2016 05:26   Ct Cardiac Morph/pulm Vein W/cm&w/o Ca Score  Addendum Date: 11/28/2016   ADDENDUM REPORT: 11/28/2016 14:14 CLINICAL DATA:  Chest pain EXAM: Cardiac CTA MEDICATIONS: Sub lingual nitro. 4mg  and lopressor 5mg  TECHNIQUE: The patient was scanned on a Siemens 192 slice scanner. Gantry rotation speed was 250 msecs. Collimation was .6 mm. A 100 kV prospective scan was triggered in the ascending thoracic aorta at 140 HU's at 65-75% of the R-R interval. Average HR during the scan was 65 bpm. The 3D data set was interpreted on a dedicated work station using MPR, MIP and VRT modes. A total of 80cc of contrast was used. FINDINGS: Non-cardiac: See separate report from [facility]. Calcium Score:  0 Agatston units. Coronary Arteries: Right dominant with no anomalies LM:  No plaque or stenosis. LAD system:  Large caliber LAD.  No plaque or stenosis. Circumflex system: Large caliber AV LCx with large OM1. No plaque or stenosis. RCA:  No plaque or stenosis. IMPRESSION: 1. Coronary artery clalcium score 0 Agatston units, suggesting low risk for future cardiac events. 2. No plaque or stenosis in the coronary arteries. The LAD and LCx were large caliber vessels. Dalton Mclean Electronically Signed   By: Marca Ancona M.D.   On: 11/28/2016 14:14   Result Date: 11/28/2016 EXAM: OVER-READ INTERPRETATION  CT CHEST The following report is an over-read performed by radiologist Dr. Maryelizabeth Rowan [facility] on 11/28/2016. This over-read does not include interpretation of  cardiac or coronary anatomy or pathology. The coronary CTA interpretation by the cardiologist is attached. COMPARISON:  None. FINDINGS: Limited view of the lung parenchyma demonstrates no suspicious nodularity. Airways are normal. Limited view of the mediastinum demonstrates no adenopathy. Esophagus normal. Limited view of the upper abdomen unremarkable. Limited view of the skeleton and chest wall is unremarkable. IMPRESSION: No significant extracardiac findings. Electronically Signed: By: Genevive Bi M.D. On: 11/28/2016 13:09   Dg Chest Portable 1 View  Result Date: 11/10/2016 CLINICAL DATA:  Shortness of breath all day. History of asthma. Wheezing. EXAM: PORTABLE CHEST 1 VIEW COMPARISON:  09/16/2016 FINDINGS: Mild pulmonary hyperinflation. Normal heart size and pulmonary vascularity. No focal airspace disease or consolidation in the lungs. No blunting of costophrenic angles. No pneumothorax. Mediastinal contours appear intact. IMPRESSION: Mild hyperinflation.  No evidence of active pulmonary disease. Electronically Signed   By: Burman Nieves M.D.   On: 11/10/2016 02:57     Diagnostic Studies/Procedures    Echocardiogram: 11/28/2016 Study Conclusions  - Left  ventricle: The cavity size was normal. Wall thickness was   normal. Systolic function was moderately reduced. The estimated   ejection fraction was in the range of 35% to 40%. Diffuse   hypokinesis. Left ventricular diastolic function parameters were   normal. - Aortic valve: There was no stenosis. - Mitral valve: There was no significant regurgitation. - Left atrium: The atrium was mildly dilated. - Right ventricle: The cavity size was normal. Systolic function   was mildly reduced. - Pulmonary arteries: No complete TR doppler jet so unable to   estimate PA systolic pressure. - Systemic veins: IVC measured 2.6 cm with < 50% respirophasic   variation, suggesting RA pressure 15 mmHg.  Impressions:  - Normal LV size with  diffuse hypokinesis, EF 35-40%. Diastolic   function actually appears normal. Normal RV size with mildly   decreased systolic function. No significant valvular   abnormalities. Dilated IVC suggestive of elevated RV filling   pressure.  Disposition   Pt is being discharged home today in good condition.  Follow-up Plans & Appointments    Follow-up Information    [facility] Follow up on 12/05/2016.   Why:  9:30 am for hospital follow up Contact information: [address] [facility] [address] [phone]       Ellsworth Lennox, PA-C Follow up.   Specialties:  Physician Assistant, Cardiology Why:  Cardiology Hospital Follow-up on 12/14/2016 at 10:30AM with Dr. Blanchie Dessert PA. Contact information: [address] [place] [address] [phone]          Discharge Instructions    Diet - low sodium heart healthy    Complete by:  As directed    Increase activity slowly    Complete by:  As directed       Discharge Medications     Medication List    STOP taking these medications   predniSONE 20 MG tablet Commonly known as:  DELTASONE     TAKE these medications   albuterol 108 (90 Base) MCG/ACT inhaler Commonly known as:  PROVENTIL HFA;VENTOLIN HFA Inhale 2 puffs into the lungs every 4 (four) hours as needed for wheezing or shortness of breath. Notes to patient:  As needed for shortness of breath   aspirin 81 MG EC tablet Take 1 tablet (81 mg total) by mouth daily. Notes to patient:  Heart health   Fluticasone-Salmeterol 250-50 MCG/DOSE Aepb Commonly known as:  ADVAIR DISKUS Inhale 1 puff into the lungs 2 (two) times daily. Notes to patient:  Shortness of breath    losartan 25 MG tablet Commonly known as:  COZAAR Take 0.5 tablets (12.5 mg total) by mouth daily. Notes to patient:  Decreases blood pressure Decreases work of the heart        Aspirin prescribed at discharge?  Yes High  Intensity Statin Prescribed? (Lipitor 40-80mg  or Crestor 20-40mg ): No: LDL at goal Beta Blocker Prescribed? No: Cocaine Use For EF 40% or less, Was ACEI/ARB Prescribed? Yes ADP Receptor Inhibitor Prescribed? (i.e. Plavix etc.-Includes Medically Managed Patients): No: Not ACS For EF <40%, Aldosterone Inhibitor Prescribed? No, consider as outpatient.  Was EF assessed during THIS hospitalization? Yes Was Cardiac Rehab II ordered? (Included Medically managed Patients): No: Not ACS   Allergies No Known Allergies   Outstanding Labs/Studies   None  Duration of Discharge Encounter   Greater than 30 minutes including physician time.  Signed, Ellsworth Lennox, PA-C 11/28/2016, 5:25 PM

## 2016-11-28 NOTE — Progress Notes (Signed)
  Echocardiogram 2D Echocardiogram has been performed.  Joseph Huff 11/28/2016, 12:02 PM

## 2016-12-02 ENCOUNTER — Encounter (HOSPITAL_COMMUNITY): Payer: Self-pay

## 2016-12-02 ENCOUNTER — Inpatient Hospital Stay (HOSPITAL_COMMUNITY)
Admission: EM | Admit: 2016-12-02 | Discharge: 2016-12-05 | DRG: 897 | Disposition: A | Discharge status: Home / Self Care | Payer: Medicaid Other | Attending: Student in an Organized Health Care Education/Training Program | Admitting: Student in an Organized Health Care Education/Training Program

## 2016-12-02 ENCOUNTER — Other Ambulatory Visit: Payer: Self-pay

## 2016-12-02 ENCOUNTER — Emergency Department (HOSPITAL_COMMUNITY): Payer: Medicaid Other

## 2016-12-02 DIAGNOSIS — F17210 Nicotine dependence, cigarettes, uncomplicated: Secondary | ICD-10-CM | POA: Diagnosis not present

## 2016-12-02 DIAGNOSIS — Y900 Blood alcohol level of less than 20 mg/100 ml: Secondary | ICD-10-CM | POA: Diagnosis present

## 2016-12-02 DIAGNOSIS — R4182 Altered mental status, unspecified: Secondary | ICD-10-CM | POA: Diagnosis not present

## 2016-12-02 DIAGNOSIS — I5022 Chronic systolic (congestive) heart failure: Secondary | ICD-10-CM | POA: Diagnosis not present

## 2016-12-02 DIAGNOSIS — Z79899 Other long term (current) drug therapy: Secondary | ICD-10-CM

## 2016-12-02 DIAGNOSIS — J4521 Mild intermittent asthma with (acute) exacerbation: Secondary | ICD-10-CM | POA: Diagnosis present

## 2016-12-02 DIAGNOSIS — I951 Orthostatic hypotension: Secondary | ICD-10-CM

## 2016-12-02 DIAGNOSIS — I428 Other cardiomyopathies: Secondary | ICD-10-CM | POA: Diagnosis not present

## 2016-12-02 DIAGNOSIS — R51 Headache: Secondary | ICD-10-CM | POA: Diagnosis present

## 2016-12-02 DIAGNOSIS — R079 Chest pain, unspecified: Secondary | ICD-10-CM | POA: Diagnosis present

## 2016-12-02 DIAGNOSIS — F1410 Cocaine abuse, uncomplicated: Secondary | ICD-10-CM | POA: Diagnosis present

## 2016-12-02 DIAGNOSIS — R0902 Hypoxemia: Secondary | ICD-10-CM | POA: Diagnosis present

## 2016-12-02 DIAGNOSIS — F19239 Other psychoactive substance dependence with withdrawal, unspecified: Principal | ICD-10-CM | POA: Diagnosis present

## 2016-12-02 DIAGNOSIS — Z8249 Family history of ischemic heart disease and other diseases of the circulatory system: Secondary | ICD-10-CM | POA: Diagnosis not present

## 2016-12-02 DIAGNOSIS — Z833 Family history of diabetes mellitus: Secondary | ICD-10-CM | POA: Diagnosis not present

## 2016-12-02 DIAGNOSIS — R509 Fever, unspecified: Secondary | ICD-10-CM

## 2016-12-02 DIAGNOSIS — J45909 Unspecified asthma, uncomplicated: Secondary | ICD-10-CM | POA: Diagnosis not present

## 2016-12-02 DIAGNOSIS — Z7982 Long term (current) use of aspirin: Secondary | ICD-10-CM

## 2016-12-02 DIAGNOSIS — R0789 Other chest pain: Secondary | ICD-10-CM | POA: Diagnosis present

## 2016-12-02 DIAGNOSIS — R0602 Shortness of breath: Secondary | ICD-10-CM | POA: Diagnosis present

## 2016-12-02 DIAGNOSIS — F1290 Cannabis use, unspecified, uncomplicated: Secondary | ICD-10-CM | POA: Diagnosis not present

## 2016-12-02 DIAGNOSIS — F1010 Alcohol abuse, uncomplicated: Secondary | ICD-10-CM | POA: Diagnosis present

## 2016-12-02 DIAGNOSIS — F1490 Cocaine use, unspecified, uncomplicated: Secondary | ICD-10-CM | POA: Diagnosis not present

## 2016-12-02 DIAGNOSIS — I959 Hypotension, unspecified: Secondary | ICD-10-CM | POA: Diagnosis present

## 2016-12-02 DIAGNOSIS — Z7951 Long term (current) use of inhaled steroids: Secondary | ICD-10-CM | POA: Diagnosis not present

## 2016-12-02 DIAGNOSIS — F1990 Other psychoactive substance use, unspecified, uncomplicated: Secondary | ICD-10-CM

## 2016-12-02 HISTORY — DX: Essential (primary) hypertension: I10

## 2016-12-02 LAB — CBC
HEMATOCRIT: 40 % (ref 39.0–52.0)
HEMOGLOBIN: 13.6 g/dL (ref 13.0–17.0)
MCH: 28.4 pg (ref 26.0–34.0)
MCHC: 34 g/dL (ref 30.0–36.0)
MCV: 83.5 fL (ref 78.0–100.0)
Platelets: 222 10*3/uL (ref 150–400)
RBC: 4.79 MIL/uL (ref 4.22–5.81)
RDW: 13.8 % (ref 11.5–15.5)
WBC: 8.4 10*3/uL (ref 4.0–10.5)

## 2016-12-02 LAB — COMPREHENSIVE METABOLIC PANEL
ALBUMIN: 3.6 g/dL (ref 3.5–5.0)
ALT: 18 U/L (ref 17–63)
ANION GAP: 9 (ref 5–15)
AST: 28 U/L (ref 15–41)
Alkaline Phosphatase: 96 U/L (ref 38–126)
BILIRUBIN TOTAL: 0.6 mg/dL (ref 0.3–1.2)
BUN: 20 mg/dL (ref 6–20)
CALCIUM: 8.6 mg/dL — AB (ref 8.9–10.3)
CO2: 26 mmol/L (ref 22–32)
Chloride: 101 mmol/L (ref 101–111)
Creatinine, Ser: 1.13 mg/dL (ref 0.61–1.24)
GLUCOSE: 100 mg/dL — AB (ref 65–99)
POTASSIUM: 3.6 mmol/L (ref 3.5–5.1)
Sodium: 136 mmol/L (ref 135–145)
TOTAL PROTEIN: 6.3 g/dL — AB (ref 6.5–8.1)

## 2016-12-02 LAB — URINALYSIS, ROUTINE W REFLEX MICROSCOPIC
BILIRUBIN URINE: NEGATIVE
GLUCOSE, UA: NEGATIVE mg/dL
HGB URINE DIPSTICK: NEGATIVE
KETONES UR: 5 mg/dL — AB
Leukocytes, UA: NEGATIVE
Nitrite: NEGATIVE
PH: 5 (ref 5.0–8.0)
Protein, ur: NEGATIVE mg/dL
SPECIFIC GRAVITY, URINE: 1.03 (ref 1.005–1.030)

## 2016-12-02 LAB — I-STAT ARTERIAL BLOOD GAS, ED
ACID-BASE EXCESS: 2 mmol/L (ref 0.0–2.0)
Bicarbonate: 27.3 mmol/L (ref 20.0–28.0)
O2 SAT: 97 %
PH ART: 7.404 (ref 7.350–7.450)
Patient temperature: 98
TCO2: 29 mmol/L (ref 0–100)
pCO2 arterial: 43.5 mmHg (ref 32.0–48.0)
pO2, Arterial: 87 mmHg (ref 83.0–108.0)

## 2016-12-02 LAB — I-STAT TROPONIN, ED
Troponin i, poc: 0 ng/mL (ref 0.00–0.08)
Troponin i, poc: 0 ng/mL (ref 0.00–0.08)

## 2016-12-02 LAB — RAPID URINE DRUG SCREEN, HOSP PERFORMED
AMPHETAMINES: NOT DETECTED
BENZODIAZEPINES: NOT DETECTED
Barbiturates: NOT DETECTED
Cocaine: POSITIVE — AB
OPIATES: NOT DETECTED
TETRAHYDROCANNABINOL: NOT DETECTED

## 2016-12-02 LAB — ETHANOL

## 2016-12-02 LAB — SALICYLATE LEVEL

## 2016-12-02 LAB — ACETAMINOPHEN LEVEL: Acetaminophen (Tylenol), Serum: <10 ug/mL — ABNORMAL LOW (ref 10–30)

## 2016-12-02 LAB — D-DIMER, QUANTITATIVE: D-Dimer, Quant: 0.45 ug/mL-FEU (ref 0.00–0.50)

## 2016-12-02 LAB — BRAIN NATRIURETIC PEPTIDE: B NATRIURETIC PEPTIDE 5: 8.1 pg/mL (ref 0.0–100.0)

## 2016-12-02 LAB — TROPONIN I: Troponin I: <0.03 ng/mL (ref <0.03)

## 2016-12-02 MED ORDER — SODIUM CHLORIDE 0.9% FLUSH
3.0000 mL | Freq: Two times a day (BID) | INTRAVENOUS | Status: DC
  Administered 2016-12-02 – 2016-12-05 (×3): 3 mL via INTRAVENOUS

## 2016-12-02 MED ORDER — ALUM & MAG HYDROXIDE-SIMETH 200-200-20 MG/5ML PO SUSP
15.0000 mL | Freq: Four times a day (QID) | ORAL | Status: DC | PRN
  Administered 2016-12-02: 15 mL via ORAL
  Filled 2016-12-02: qty 30

## 2016-12-02 MED ORDER — IPRATROPIUM-ALBUTEROL 0.5-2.5 (3) MG/3ML IN SOLN
3.0000 mL | Freq: Once | RESPIRATORY_TRACT | Status: AC
  Administered 2016-12-02: 3 mL via RESPIRATORY_TRACT
  Filled 2016-12-02: qty 3

## 2016-12-02 MED ORDER — NALOXONE HCL 0.4 MG/ML IJ SOLN
0.4000 mg | Freq: Once | INTRAMUSCULAR | Status: AC
  Administered 2016-12-02: 0.4 mg via INTRAVENOUS
  Filled 2016-12-02: qty 1

## 2016-12-02 MED ORDER — IPRATROPIUM-ALBUTEROL 0.5-2.5 (3) MG/3ML IN SOLN
3.0000 mL | Freq: Once | RESPIRATORY_TRACT | Status: AC
Start: 2016-12-02 — End: 2016-12-02
  Administered 2016-12-02: 3 mL via RESPIRATORY_TRACT
  Filled 2016-12-02: qty 3

## 2016-12-02 MED ORDER — SENNA 8.6 MG PO TABS
1.0000 | ORAL_TABLET | Freq: Every day | ORAL | Status: DC
  Administered 2016-12-03 – 2016-12-05 (×3): 8.6 mg via ORAL
  Filled 2016-12-02 (×3): qty 1

## 2016-12-02 MED ORDER — ONDANSETRON HCL 4 MG/2ML IJ SOLN
4.0000 mg | Freq: Once | INTRAMUSCULAR | Status: AC
  Administered 2016-12-02: 4 mg via INTRAVENOUS
  Filled 2016-12-02: qty 2

## 2016-12-02 MED ORDER — SODIUM CHLORIDE 0.9 % IV BOLUS (SEPSIS)
1000.0000 mL | Freq: Once | INTRAVENOUS | Status: AC
  Administered 2016-12-02: 1000 mL via INTRAVENOUS

## 2016-12-02 MED ORDER — ACETAMINOPHEN 650 MG RE SUPP: 650.0000 mg | Freq: Four times a day (QID) | RECTAL | Status: DC | PRN

## 2016-12-02 MED ORDER — GUAIFENESIN-DM 100-10 MG/5ML PO SYRP
5.0000 mL | ORAL_SOLUTION | ORAL | Status: DC | PRN
  Administered 2016-12-02 (×2): 5 mL via ORAL
  Filled 2016-12-02 (×2): qty 5

## 2016-12-02 MED ORDER — ACETAMINOPHEN 325 MG PO TABS
650.0000 mg | ORAL_TABLET | Freq: Four times a day (QID) | ORAL | Status: DC | PRN
  Administered 2016-12-02 – 2016-12-05 (×8): 650 mg via ORAL
  Filled 2016-12-02 (×8): qty 2

## 2016-12-02 MED ORDER — NALOXONE HCL 2 MG/2ML IJ SOSY
1.0000 mg | PREFILLED_SYRINGE | Freq: Once | INTRAMUSCULAR | Status: AC
  Administered 2016-12-02: 1 mg via INTRAVENOUS
  Filled 2016-12-02: qty 2

## 2016-12-02 MED ORDER — POLYETHYLENE GLYCOL 3350 17 G PO PACK
17.0000 g | PACK | Freq: Every day | ORAL | Status: DC
  Administered 2016-12-02 – 2016-12-05 (×4): 17 g via ORAL
  Filled 2016-12-02 (×4): qty 1

## 2016-12-02 MED ORDER — SODIUM CHLORIDE 0.9 % IV BOLUS (SEPSIS): 1000.0000 mL | Freq: Once | INTRAVENOUS | Status: DC

## 2016-12-02 MED ORDER — IPRATROPIUM-ALBUTEROL 0.5-2.5 (3) MG/3ML IN SOLN
3.0000 mL | Freq: Four times a day (QID) | RESPIRATORY_TRACT | Status: DC
  Filled 2016-12-02: qty 3

## 2016-12-02 MED ORDER — ASPIRIN EC 81 MG PO TBEC
81.0000 mg | DELAYED_RELEASE_TABLET | Freq: Every day | ORAL | Status: DC
  Administered 2016-12-02 – 2016-12-05 (×4): 81 mg via ORAL
  Filled 2016-12-02 (×4): qty 1

## 2016-12-02 MED ORDER — ALBUTEROL SULFATE (2.5 MG/3ML) 0.083% IN NEBU
2.5000 mg | INHALATION_SOLUTION | RESPIRATORY_TRACT | Status: DC | PRN
  Administered 2016-12-02: 2.5 mg via RESPIRATORY_TRACT
  Filled 2016-12-02: qty 3

## 2016-12-02 MED ORDER — ENOXAPARIN SODIUM 40 MG/0.4ML ~~LOC~~ SOLN
40.0000 mg | SUBCUTANEOUS | Status: DC
  Administered 2016-12-02 – 2016-12-05 (×3): 40 mg via SUBCUTANEOUS
  Filled 2016-12-02 (×4): qty 0.4

## 2016-12-02 MED ORDER — MOMETASONE FURO-FORMOTEROL FUM 200-5 MCG/ACT IN AERO
2.0000 | INHALATION_SPRAY | Freq: Two times a day (BID) | RESPIRATORY_TRACT | Status: DC
  Administered 2016-12-02 – 2016-12-05 (×5): 2 via RESPIRATORY_TRACT
  Filled 2016-12-02 (×2): qty 8.8

## 2016-12-02 MED ORDER — IPRATROPIUM-ALBUTEROL 0.5-2.5 (3) MG/3ML IN SOLN: 3.0000 mL | Freq: Four times a day (QID) | RESPIRATORY_TRACT | Status: DC | PRN

## 2016-12-02 NOTE — ED Notes (Signed)
Admitting at bedside 

## 2016-12-02 NOTE — ED Provider Notes (Signed)
[facility]-EMERGENCY DEPT Provider Note   CSN: [id number] Arrival date & time: 12/02/16  0302     History   Chief Complaint Chief Complaint  Patient presents with  . Chest Pain    HPI Joseph Huff is a 36 y.o. male.  The history is provided by the patient and medical records. No language interpreter was used.   Joseph Huff is a 36 y.o. male  with a PMH of asthma who presents to the Emergency Department complaining of constant chest pain which began suddenly tonight. He called 911. Patient very drowsy and unable to contribute much to history. He does endorse cocaine use yesterday and that he took something a friend gave him for a headache earlier today which he thought was tylenol. Unsure of if / what drugs he used tonight. Given 324 ASA and 5mg  albuterol prior to arrival. Of note, patient was admitted on 8/14 for chest pain thought to be 2/2 cocaine use. Troponin at that time was positive at 0.07. Cardiac CT reassuring. Echo with EF of 35-45%. Discharged the next day, 8/15.    Level V caveat applies 2/2 altered mental status.  Past Medical History:  Diagnosis Date  . Asthma   . Panic attack ?2017 X 1  . Pneumonia ~ 2016    Patient Active Problem List   Diagnosis Date Noted  . Nonischemic cardiomyopathy (HCC) 11/28/2016  . Mild intermittent asthma with exacerbation   . Pleuritic chest pain 11/27/2016  . Shortness of breath   . Mild intermittent asthma with acute exacerbation   . Foreign body of pharynx 08/12/2016  . Dysphagia 08/12/2016    Past Surgical History:  Procedure Laterality Date  . DIRECT LARYNGOSCOPY N/A 08/12/2016   Procedure: LARYNGOSCOPY AND CERVICAL ESOPHOGOSCOPY;  Surgeon: Osborn Coho, MD;  Location: [facility] OR;  Service: ENT;  Laterality: N/A;       Home Medications    Prior to Admission medications   Medication Sig Start Date End Date Taking? Authorizing Provider  albuterol (PROVENTIL HFA;VENTOLIN HFA) 108 (90 Base) MCG/ACT inhaler Inhale 2 puffs into the  lungs every 4 (four) hours as needed for wheezing or shortness of breath. 11/28/16   Strader, Lennart Pall, PA-C  aspirin 81 MG EC tablet Take 1 tablet (81 mg total) by mouth daily. 11/29/16   Strader, Lennart Pall, PA-C  Fluticasone-Salmeterol (ADVAIR DISKUS) 250-50 MCG/DOSE AEPB Inhale 1 puff into the lungs 2 (two) times daily. 11/28/16 11/28/17  Strader, Lennart Pall, PA-C  losartan (COZAAR) 25 MG tablet Take 0.5 tablets (12.5 mg total) by mouth daily. 11/29/16   Ellsworth Lennox, PA-C    Family History Family History  Problem Relation Age of Onset  . Hypertension Maternal Uncle   . Diabetes Maternal Uncle     Social History Social History  Substance Use Topics  . Smoking status: Current Every Day Smoker    Packs/day: 0.10    Years: 11.00    Types: Cigarettes  . Smokeless tobacco: Never Used  . Alcohol use Yes     Comment: 11/27/2016 "I'll have a beer q couple months"     Allergies   Patient has no known allergies.   Review of Systems Review of Systems  Unable to perform ROS: Mental status change     Physical Exam Updated Vital Signs BP (!) 94/53   Pulse 78   Temp 97.8 F (36.6 C) (Oral)   Resp 16   Ht 6' (1.829 m)   Wt 77.6 kg (171 lb)   SpO2 91%  BMI 23.19 kg/m   Physical Exam  Constitutional: He appears well-developed and well-nourished. No distress.  Somnolent, continually falling asleep during evaluation.  HENT:  Head: Normocephalic and atraumatic.  Cardiovascular: Normal rate, regular rhythm and normal heart sounds.   No murmur heard. Pulmonary/Chest: Effort normal. No respiratory distress.  + rhonchi and faint wheezing bilaterally.  Abdominal: Soft. He exhibits no distension. There is no tenderness.  Musculoskeletal: He exhibits no edema.  Neurological: He is alert.  Alert. Oriented x3. Slurred speech. CN 2-12 grossly intact. Strength/sensation equal and intact.   Skin: Skin is warm and dry.  Nursing note and vitals reviewed.   ED Treatments /  Results  Labs (all labs ordered are listed, but only abnormal results are displayed) Labs Reviewed  RAPID URINE DRUG SCREEN, HOSP PERFORMED - Abnormal; Notable for the following:       Result Value   Cocaine POSITIVE (*)    All other components within normal limits  COMPREHENSIVE METABOLIC PANEL - Abnormal; Notable for the following:    Glucose, Bld 100 (*)    Calcium 8.6 (*)    Total Protein 6.3 (*)    All other components within normal limits  ACETAMINOPHEN LEVEL - Abnormal; Notable for the following:    Acetaminophen (Tylenol), Serum <10 (*)    All other components within normal limits  URINALYSIS, ROUTINE W REFLEX MICROSCOPIC - Abnormal; Notable for the following:    Ketones, ur 5 (*)    All other components within normal limits  CBC  ETHANOL  BRAIN NATRIURETIC PEPTIDE  SALICYLATE LEVEL  D-DIMER, QUANTITATIVE (NOT AT [facility])  I-STAT TROPONIN, ED  I-STAT ARTERIAL BLOOD GAS, ED  I-STAT TROPONIN, ED    EKG  EKG Interpretation  Date/Time:  Sunday December 02 2016 03:07:31 EDT Ventricular Rate:  92 PR Interval:    QRS Duration: 98 QT Interval:  361 QTC Calculation: 447 R Axis:   79 Text Interpretation:  Sinus rhythm ST elev, probable normal early repol pattern No significant change since last tracing Confirmed by Rochele Raring [phone]) on 12/02/2016 3:14:17 AM       Radiology Dg Chest Portable 1 View  Result Date: 12/02/2016 CLINICAL DATA:  Chest pain and shortness of breath. History of asthma and pneumonia. EXAM: PORTABLE CHEST 1 VIEW COMPARISON:  Chest radiograph November 27, 2016 FINDINGS: Cardiomediastinal silhouette is normal. No pleural effusions or focal consolidations. Hyperinflation. Mild bronchitic changes. Trachea projects midline and there is no pneumothorax. Soft tissue planes and included osseous structures are non-suspicious. IMPRESSION: Mild bronchitic changes and hyperinflation. Electronically Signed   By: Awilda Metro M.D.   On: 12/02/2016 03:51     Procedures Procedures (including critical care time)  CRITICAL CARE Performed by: Chase Picket Tzivia Oneil   Total critical care time: 45 minutes  Critical care time was exclusive of separately billable procedures and treating other patients.  Critical care was necessary to treat or prevent imminent or life-threatening deterioration.  Critical care was time spent personally by me on the following activities: development of treatment plan with patient and/or surrogate as well as nursing, discussions with consultants, evaluation of patient's response to treatment, examination of patient, obtaining history from patient or surrogate, ordering and performing treatments and interventions, ordering and review of laboratory studies, ordering and review of radiographic studies, pulse oximetry and re-evaluation of patient's condition.   Medications Ordered in ED Medications  sodium chloride 0.9 % bolus 1,000 mL (1,000 mLs Intravenous New Bag/Given 12/02/16 0645)  naloxone [facility]) injection 0.4 mg (0.4  mg Intravenous Given 12/02/16 0329)  ipratropium-albuterol (DUONEB) 0.5-2.5 (3) MG/3ML nebulizer solution 3 mL (3 mLs Nebulization Given 12/02/16 0328)  ondansetron (ZOFRAN) injection 4 mg (4 mg Intravenous Given 12/02/16 0329)  naloxone [facility]) injection 1 mg (1 mg Intravenous Given 12/02/16 0645)  ipratropium-albuterol (DUONEB) 0.5-2.5 (3) MG/3ML nebulizer solution 3 mL (3 mLs Nebulization Given 12/02/16 0700)     Initial Impression / Assessment and Plan / ED Course  I have reviewed the triage vital signs and the nursing notes.  Pertinent labs & imaging results that were available during my care of the patient were reviewed by me and considered in my medical decision making (see chart for details).    Joseph Huff is a 36 y.o. male who presents to ED for chest pain. Initially presented hypotensive and hypoxic. Somnolent but arousable. Hx of substance abuse - more likely presentation due to substance  abuse today. Troponin negative x 2. D-dimer negative. EKG non-ischemic. CXR reassuring. Labwork including ABG reassuring. UDS + for cocaine but otherwise unremarkable. Patient persistently hypotensive. 90-92% O2 on RA. Feel like patient needs admission for further evaluation / management. Teaching service consulted who will admit.    Patient seen by and discussed with Dr. Elesa Massed who agrees with treatment plan.    Final Clinical Impressions(s) / ED Diagnoses   Final diagnoses:  Other chest pain  Cocaine abuse  Hypotension, unspecified hypotension type  Hypoxia  Altered mental status, unspecified altered mental status type    New Prescriptions New Prescriptions   No medications on file     Lluvia Gwynne, Chase Picket, PA-C 12/02/16 0730

## 2016-12-02 NOTE — ED Notes (Signed)
Pt ambulates efficiently with no assistance, while maintaining O2 stats above 90%.

## 2016-12-02 NOTE — ED Provider Notes (Signed)
Medical screening examination/treatment/procedure(s) were conducted as a shared visit with non-physician practitioner(s) and myself.  I personally evaluated the patient during the encounter.   EKG Interpretation  Date/Time:  Sunday December 02 2016 03:07:31 EDT Ventricular Rate:  92 PR Interval:    QRS Duration: 98 QT Interval:  361 QTC Calculation: 447 R Axis:   79 Text Interpretation:  Sinus rhythm ST elev, probable normal early repol pattern No significant change since last tracing Confirmed by Rochele Raring [phone]) on 12/02/2016 3:14:17 AM      Patient is a 36 year old male with history of cocaine abuse, nonischemic cardiomyopathy with EF of 35-40% who presents emergency department with chest pain. Patient was just admitted to the hospital for chest pain in the setting of cocaine use. Had a normal cardiac CTA. Patient arrives to the emergency department very somnolent. He reports he was given something by a coworker today for his headache but thinks it was Tylenol. Reports last cocaine use was yesterday. Denies any other drug use. He denies fevers, cough, vomiting, diarrhea, shortness of breath. On exam, patient is initially hypoxic, hypotensive. He is somnolent and falls asleep repeatedly during questioning and examination. His pupils are 2-3 mm and sluggishly reactive bilaterally. He is no sign of trauma on exam. He has rhonchorous breath sounds diffusely. His abdomen is soft and nontender. He is moving all of his extremities. No slurred speech. No facial asymmetry. EKG shows repolarization abnormalities consistent with previous. Troponin is negative x 2.  No significant improvement with Narcan. Drug screen is positive for cocaine but no other abnormality. Ethanol, Tylenol, salicylate levels negative. Chest x-ray shows bronchitic changes but no edema or infiltrate in his BNP is normal. D-dimer is negative.  On reevaluation, patient's oxygen saturation is 90% on room air and he continues to be  mildly hypotensive. Repeat temperature is normal. He has a normal neurologic exam. Reports normal sensation diffusely, cranial nerves II-12 intact, normal movement of all 4 extremities, no pronator drift. I still suspect that this is substance related. I feel he will need admission given continued hypoxia and hypotension. He is now getting a liter of IV fluids.   Dory Demont, Layla Maw, DO 12/02/16 [phone]

## 2016-12-02 NOTE — H&P (Signed)
Date: 12/02/2016               Patient Name:  Joseph Huff MRN: [id number]  DOB: [birth year] Age / Sex: 36 y.o., male   PCP: Patient, No Pcp Per         Medical Service: Internal Medicine Teaching Service         Attending Physician: Dr. Oswaldo Done, Marquita Palms, *    First Contact: Dr. Anthonette Legato Pager: [phone]  Second Contact: Dr. Earlene Plater Pager: [phone]       After Hours (After 5p/  First Contact Pager: [phone]  weekends / holidays): Second Contact Pager: [phone]   Chief Complaint: Chest pain, altered mental status  History of Present Illness: Joseph Huff is a 36 yo M with a history of asthma, cocaine use disorder, non-ischemic cardiomyopathy (EF 35-40%) who presents to the ED with complaints of chest pain. During history, he is drowsy, easily awoken but falls asleep between questions.    He initially reported chest pain to EMS and ED triage personnel, however, pt denies any current chest pain or pain that led him to seek care. He states yesterday he had episodes of dizziness during which he would become disoriented and unsure of where he was, felt like his brain "wasn't working right". He is unable to elucidate further details of these events. Yesterday, he notes a headache or shoulder pain which he took an over the counter pain medicine his friend gave him, unsure of the specific name. He used cocaine and marijuana yesterday, took his Losartan as prescribed, recently started on 8/15. He denies other drug use.     Prior to arrival he was found to be wheezing and received albuterol with EMS. In the ED, T 98, HR 86, 105/55, 92% on RA. He was given Narcan x2 without change in mental status. His BP decreased (lowest 80/45), responded to 1 L NS bolus and O2 sat decreased to 90%, ABG wnl. UDS positive for cocaine. Labs otherwise unremarkable.    Meds:  Current Meds  Medication Sig  . albuterol (PROVENTIL HFA;VENTOLIN HFA) 108 (90 Base) MCG/ACT inhaler Inhale 2 puffs into the lungs every 4  (four) hours as needed for wheezing or shortness of breath.  Marland Kitchen aspirin 81 MG EC tablet Take 1 tablet (81 mg total) by mouth daily.  . Fluticasone-Salmeterol (ADVAIR DISKUS) 250-50 MCG/DOSE AEPB Inhale 1 puff into the lungs 2 (two) times daily.  Marland Kitchen losartan (COZAAR) 25 MG tablet Take 0.5 tablets (12.5 mg total) by mouth daily.     Allergies: Allergies as of 12/02/2016  . (No Known Allergies)   Past Medical History:  Diagnosis Date  . Asthma   . Panic attack ?2017 X 1  . Pneumonia ~ 2016    Family History:  Family History  Problem Relation Age of Onset  . Hypertension Maternal Uncle   . Diabetes Maternal Uncle      Social History:  Social History  Substance Use Topics  . Smoking status: Current Every Day Smoker    Packs/day: 0.10    Years: 11.00    Types: Cigarettes  . Smokeless tobacco: Never Used  . Alcohol use Yes     Comment: 11/27/2016 "I'll have a beer q couple months"     Review of Systems: A complete ROS was negative except as per HPI.   Physical Exam: Blood pressure 104/67, pulse 73, temperature 97.8 F (36.6 C), temperature source Oral, resp. rate 18, height 6' (1.829 m), weight 171 lb (77.6  kg), SpO2 93 %. Physical Exam  Constitutional: He appears well-developed.  Drowsy, receiving breathing treatment   HENT:  Head: Normocephalic and atraumatic.  Mouth/Throat: Oropharynx is clear and moist.  Eyes: Pupils are equal, round, and reactive to light. EOM are normal.  Neck: Neck supple.  Cardiovascular: Normal rate, regular rhythm and intact distal pulses.   Pulmonary/Chest: Effort normal. No respiratory distress.  Inspiratory wheeze bilaterally   Abdominal: Soft. He exhibits no distension.  Mild tenderness to palpation of abdomen   Musculoskeletal: He exhibits no edema.  Neurological: No cranial nerve deficit.  Alert and oriented x 3, full strength in all extremities, no focal deficits   Skin: Skin is warm and dry. Capillary refill takes less than 2  seconds.  Vitals reviewed.    EKG: personally reviewed my interpretation is sinus rhythm, repolarization abnormality, similar appearance to prior.   CXR: personally reviewed my interpretation is hyperinflation, no acute process   Assessment & Plan by Problem:  Hypotension,  Altered Mental Status Pt drowsy on exam with low BP down to 80s systolic. Pt recently started Losartan 25 mg after recent admission 8/15. It appears his baseline BP is soft with systolic values of 90s, low 100s documented at past ED visits. Initiation of anti-hypertensive for HF may have caused symptomatic hypotension. Differential also includes substance use- no response to Narcan, UDS negative for opiods though may not be sufficiently sensitive to rule out or include all possible substances. Tylenol, salicylate, ethanol levels normal.  --Hold home Losartan --Orthostatic vital signs, monitor vital signs --Additional 1 L NS bolus, assess response  --BMP, CBC  Chest Pain,  HFrEF Initial complaints of chest pain, though denies on repeat history. Troponin negative, no ischemic changes on EKG, D-dimer negative. Recent admission 8/14-8/15 for chest pain with low risk coronary CT, echo revealed EF 35-40% felt to be non-ischemic cardiomyopathy, possibly related to cocaine use. Started on ARB, beta blocker held given cocaine use. No signs of volume overload on exam. Will monitor while giving fluids.  --I/O, daily weights --Repeat EKG    Asthma History of asthma with numerous ED visits for exacerbation. On home Advair and albuterol. Received breathing treatments prior to arrival and in ED for wheezing. O2 sats dropped to 90% in ED.    --Continue home asthma medications, duoneb q6hr scheduled --O2 to keep sats >92%, wean as tolerated    Dispo: Admit patient to Observation with expected length of stay less than 2 midnights.  Signed: Ginger Carne, MD 12/02/2016, 10:42 AM  Pager: [phone]

## 2016-12-02 NOTE — ED Triage Notes (Signed)
Pt arrived EMS from home with c/o left sided chest ain radiating to left arm.EMS believes he may have used narcotics/heroin prior to EMS arrival. Pt is arrousable to speech but continuously nods off. t found ot be wheeezing in the field, given 5mg  albuterol, 324 ASA and fluid PTA

## 2016-12-02 NOTE — Plan of Care (Signed)
Problem: Safety: Goal: Ability to remain free from injury will improve Outcome: Progressing Oriented to unit. Instructed to call for needs. Call bell in reach.

## 2016-12-02 NOTE — Progress Notes (Addendum)
Patient having increased anxiety, contacted IM to request medication. Was told by IM to redirect patient and no medication ordered at this time, will continue to monitor. 12/02/16 2259  Patient has increased detox symptoms, sweating, anxiety, nausea and tremors. Spoke with IM resident in regards to this and she said she will round on patient this morning.  12/03/16 9201

## 2016-12-03 DIAGNOSIS — F17210 Nicotine dependence, cigarettes, uncomplicated: Secondary | ICD-10-CM | POA: Diagnosis not present

## 2016-12-03 DIAGNOSIS — M542 Cervicalgia: Secondary | ICD-10-CM | POA: Diagnosis present

## 2016-12-03 DIAGNOSIS — Z7982 Long term (current) use of aspirin: Secondary | ICD-10-CM | POA: Diagnosis not present

## 2016-12-03 DIAGNOSIS — R4182 Altered mental status, unspecified: Secondary | ICD-10-CM

## 2016-12-03 DIAGNOSIS — R51 Headache: Secondary | ICD-10-CM | POA: Diagnosis present

## 2016-12-03 DIAGNOSIS — Z79899 Other long term (current) drug therapy: Secondary | ICD-10-CM | POA: Diagnosis not present

## 2016-12-03 DIAGNOSIS — F1290 Cannabis use, unspecified, uncomplicated: Secondary | ICD-10-CM | POA: Diagnosis not present

## 2016-12-03 DIAGNOSIS — I5022 Chronic systolic (congestive) heart failure: Secondary | ICD-10-CM | POA: Diagnosis present

## 2016-12-03 DIAGNOSIS — F1490 Cocaine use, unspecified, uncomplicated: Secondary | ICD-10-CM | POA: Diagnosis not present

## 2016-12-03 DIAGNOSIS — R000 Tachycardia, unspecified: Secondary | ICD-10-CM

## 2016-12-03 DIAGNOSIS — I5020 Unspecified systolic (congestive) heart failure: Secondary | ICD-10-CM | POA: Diagnosis not present

## 2016-12-03 DIAGNOSIS — J4521 Mild intermittent asthma with (acute) exacerbation: Secondary | ICD-10-CM | POA: Diagnosis present

## 2016-12-03 DIAGNOSIS — Z8249 Family history of ischemic heart disease and other diseases of the circulatory system: Secondary | ICD-10-CM | POA: Diagnosis not present

## 2016-12-03 DIAGNOSIS — R509 Fever, unspecified: Secondary | ICD-10-CM | POA: Diagnosis present

## 2016-12-03 DIAGNOSIS — J45909 Unspecified asthma, uncomplicated: Secondary | ICD-10-CM

## 2016-12-03 DIAGNOSIS — R0789 Other chest pain: Secondary | ICD-10-CM | POA: Diagnosis present

## 2016-12-03 DIAGNOSIS — I428 Other cardiomyopathies: Secondary | ICD-10-CM | POA: Diagnosis present

## 2016-12-03 DIAGNOSIS — I959 Hypotension, unspecified: Secondary | ICD-10-CM | POA: Diagnosis present

## 2016-12-03 DIAGNOSIS — R0902 Hypoxemia: Secondary | ICD-10-CM | POA: Diagnosis present

## 2016-12-03 DIAGNOSIS — Z7951 Long term (current) use of inhaled steroids: Secondary | ICD-10-CM

## 2016-12-03 DIAGNOSIS — F1410 Cocaine abuse, uncomplicated: Secondary | ICD-10-CM | POA: Diagnosis present

## 2016-12-03 DIAGNOSIS — F1010 Alcohol abuse, uncomplicated: Secondary | ICD-10-CM | POA: Diagnosis present

## 2016-12-03 DIAGNOSIS — F1990 Other psychoactive substance use, unspecified, uncomplicated: Secondary | ICD-10-CM | POA: Diagnosis not present

## 2016-12-03 DIAGNOSIS — R112 Nausea with vomiting, unspecified: Secondary | ICD-10-CM | POA: Diagnosis not present

## 2016-12-03 DIAGNOSIS — I10 Essential (primary) hypertension: Secondary | ICD-10-CM | POA: Diagnosis not present

## 2016-12-03 DIAGNOSIS — F19239 Other psychoactive substance dependence with withdrawal, unspecified: Secondary | ICD-10-CM | POA: Diagnosis present

## 2016-12-03 DIAGNOSIS — Y900 Blood alcohol level of less than 20 mg/100 ml: Secondary | ICD-10-CM | POA: Diagnosis present

## 2016-12-03 LAB — BASIC METABOLIC PANEL
Anion gap: 6 (ref 5–15)
BUN: 10 mg/dL (ref 6–20)
CALCIUM: 8.3 mg/dL — AB (ref 8.9–10.3)
CO2: 29 mmol/L (ref 22–32)
Chloride: 104 mmol/L (ref 101–111)
Creatinine, Ser: 1.11 mg/dL (ref 0.61–1.24)
GFR calc Af Amer: >60 mL/min (ref >60)
GLUCOSE: 93 mg/dL (ref 65–99)
Potassium: 3.8 mmol/L (ref 3.5–5.1)
Sodium: 139 mmol/L (ref 135–145)

## 2016-12-03 LAB — CBC
HCT: 40.3 % (ref 39.0–52.0)
Hemoglobin: 13 g/dL (ref 13.0–17.0)
MCH: 27.7 pg (ref 26.0–34.0)
MCHC: 32.3 g/dL (ref 30.0–36.0)
MCV: 85.9 fL (ref 78.0–100.0)
PLATELETS: 197 10*3/uL (ref 150–400)
RBC: 4.69 MIL/uL (ref 4.22–5.81)
RDW: 14.1 % (ref 11.5–15.5)
WBC: 9.9 10*3/uL (ref 4.0–10.5)

## 2016-12-03 LAB — CSF CELL COUNT WITH DIFFERENTIAL
RBC Count, CSF: 1 /mm3 — ABNORMAL HIGH
Tube #: 3
WBC, CSF: 2 /mm3 (ref 0–5)

## 2016-12-03 LAB — PROTEIN AND GLUCOSE, CSF
GLUCOSE CSF: 71 mg/dL — AB (ref 40–70)
TOTAL PROTEIN, CSF: 19 mg/dL (ref 15–45)

## 2016-12-03 MED ORDER — IPRATROPIUM-ALBUTEROL 0.5-2.5 (3) MG/3ML IN SOLN
3.0000 mL | Freq: Once | RESPIRATORY_TRACT | Status: AC
  Administered 2016-12-05: 3 mL via RESPIRATORY_TRACT
  Filled 2016-12-03 (×2): qty 3

## 2016-12-03 MED ORDER — ONDANSETRON HCL 4 MG/2ML IJ SOLN
4.0000 mg | Freq: Once | INTRAMUSCULAR | Status: AC
  Administered 2016-12-03: 4 mg via INTRAVENOUS
  Filled 2016-12-03: qty 2

## 2016-12-03 MED ORDER — KETOROLAC TROMETHAMINE 30 MG/ML IJ SOLN
30.0000 mg | Freq: Three times a day (TID) | INTRAMUSCULAR | Status: DC | PRN
  Administered 2016-12-03 – 2016-12-04 (×2): 30 mg via INTRAVENOUS
  Filled 2016-12-03 (×2): qty 1

## 2016-12-03 NOTE — Progress Notes (Signed)
Pt still c/o 6/10 headache - had subsided earlier after tylenol but has since returned. No other PRN orders, too early for tylenol. MD paged and made aware. Order written for Toradol. Will administer per order and continue to monitor closely.

## 2016-12-03 NOTE — Procedures (Signed)
Lumbar Puncture Procedure Note  Pre-operative Diagnosis: Fever, neck pain, altered mental status    Indications:  Fever, neck pain, altered mental status. Rule out CNS infection   Procedure Details:  Informed consent was obtained after explanation of the risks and benefits of the procedure, refer to the consent documentation.  Time-out performed immediately prior to the procedure.  Patient was placed in the seated position  The superior aspect of the iliac crests were identified, with the traverse demarcating the L4-L5 interspace, L3-L4 interspace palpated superiorly. The intervertebral space was located and marked.  This area was prepped and draped in the usual sterile fashion. Maximum sterile technique was used including antiseptics, cap, gloves, gown, hand hygiene, mask, and sterile sheet.  Local anesthesia with 1% lidocaine was applied subcutaneously then deep to the skin. The spinal needle with trocar was introduced with frequent removal of the trocar to evaluate for cerebrospinal fluid. When this fluid was noted samples were collected in four separate tubes and sent to the lab after proper labeling. The spinal needle with trocar was removed, with minimal bleeding noted upon removal. A sterile bandage was placed over the puncture site after holding pressure.  A spinal needle was inserted at the L3 - L4 interspace.   Findings: 10-12 mL of clear spinal fluid was obtained. Tube 1 was sent for glucose, protein.  Tube 2 was sent for cell count with diff.  Tube 3 was sent for gram stain and culture.  Tube 4 was sent for additional studies as indicated.         Condition:   The patient tolerated the procedure well and remains in the same condition as pre-procedure.  Complications: None; patient tolerated the procedure well.  Plan: Pt to remain supine for 1 hour.

## 2016-12-03 NOTE — Progress Notes (Signed)
Paged by RN due to concern for withdrawal. Went to bedside to evaluate. Patient is diaphoretic and mildly tremulous but hemodynamically stable (HR 90s, oxygen mid 90s on RA, BP 110s/60s). He is A&O x 3. Endorses going on a "binder" yesterday with cocaine and alcohol due to a recent "tragedy". Patient became tearful and was unable to answer further questions about this tragedy. He reports infrequent alcohol use, normally "1 beer every 3 weeks or so" and does not normal use cocaine. Reports his substance use yesterday was out of character for him. He denies any IV drug use. Of note, patient is febrile this morning to 100.5. Exam is also notable for diffuse bilaterally expiratory wheezing. Will order albuterol neb x 1 and sign out updates to AM rounding team.   Reymundo Poll, M.D. - PGY2 12/03/2016, 6:38 AM

## 2016-12-03 NOTE — Progress Notes (Signed)
Pt's wife at bedside and requesting update from MD - paged and spoke with Dr. Anthonette Legato who stated he would be up to speak with wife. He was also informed of low grade temp and headache complaint. Tylenol given per PRN orders. Will continue to monitor patient closely.

## 2016-12-03 NOTE — Progress Notes (Signed)
   Subjective: Joseph Huff mental status is improved this morning, more alert and interactive. Reports still feeling like his thinking isn't right. Also notes neck pain and headache. He admits to cocaine use via snorting, denies IV drug use or other prescription drug use such as BZDs.    Objective:  Vital signs in last 24 hours: Vitals:   12/03/16 0549 12/03/16 0551 12/03/16 0838 12/03/16 1015  BP:  108/67 114/67 109/73  Pulse:  (!) 106 98 97  Resp:   (!) 22 (!) 22  Temp:      TempSrc:      SpO2:   99% 99%  Weight: 157 lb 3 oz (71.3 kg)     Height: 6' 0.01" (1.829 m)      Physical Exam  Constitutional: He is oriented to person, place, and time. He appears well-developed.  Resting in bed comfortably in no acute distress   Cardiovascular: Regular rhythm and intact distal pulses.   Slightly tachycardic   Pulmonary/Chest: Effort normal. No respiratory distress.  Neurological: He is alert and oriented to person, place, and time.  Skin: Skin is warm and dry.     Assessment/Plan:   Altered Mental Status, Fever Substance Use Pt mental status has improved on exam, continues to endorse altered cognition from his baseline. He became febrile this morning at 100.5 F and endorsed headache and neck pain also. With this constellation of symptoms, LP will be performed to rule out encephalitis or meningitis. Recent HIV negative. His symptoms may also be related to substance withdrawal (opiod, alcohol, bzd) though he denies substance use other than cocaine, THC. No response to Narcan, UDS negative for opiods though may not be sufficiently sensitive to rule out or include all possible substances. Tylenol, salicylate, ethanol levels normal. Will continue to monitor.   --LP today with protein, glucose, cell count, stain and culture-additional studies as indicated  --Hepatitis serologies, RPR   --Hold home Losartan --Monitor vital signs  --BMP, CBC   HFrEF Initial complaints of chest pain. Troponin  negative, no ischemic changes on EKG, D-dimer negative. Recent admission 8/14-8/15 for chest pain with low risk coronary CT, echo revealed EF 35-40% felt to be non-ischemic cardiomyopathy, possibly related to cocaine use. Started on ARB, beta blocker held given cocaine use. No signs of volume overload on exam.    Asthma History of asthma with numerous ED visits for exacerbation. On home Advair and albuterol. Received breathing treatments prior to arrival and in ED for wheezing. O2 sats dropped to 90% in ED.    --Continue home asthma medications, duoneb q6hr scheduled --O2 to keep sats >92%, wean as tolerated    Dispo: Anticipated discharge in approximately 1-2 day(s).   Ginger Carne, MD 12/03/2016, 12:47 PM Pager: [phone]

## 2016-12-03 NOTE — Plan of Care (Signed)
Problem: Safety: Goal: Ability to remain free from injury will improve  Patient was disoriented to place and situation, CIWAL done, will continue to monitor

## 2016-12-04 LAB — CBC
HCT: 41.9 % (ref 39.0–52.0)
Hemoglobin: 13.6 g/dL (ref 13.0–17.0)
MCH: 27.7 pg (ref 26.0–34.0)
MCHC: 32.5 g/dL (ref 30.0–36.0)
MCV: 85.3 fL (ref 78.0–100.0)
Platelets: 198 10*3/uL (ref 150–400)
RBC: 4.91 MIL/uL (ref 4.22–5.81)
RDW: 13.9 % (ref 11.5–15.5)
WBC: 12.4 10*3/uL — ABNORMAL HIGH (ref 4.0–10.5)

## 2016-12-04 LAB — BASIC METABOLIC PANEL
Anion gap: 6 (ref 5–15)
BUN: 13 mg/dL (ref 6–20)
CHLORIDE: 108 mmol/L (ref 101–111)
CO2: 25 mmol/L (ref 22–32)
CREATININE: 1.2 mg/dL (ref 0.61–1.24)
Calcium: 8.4 mg/dL — ABNORMAL LOW (ref 8.9–10.3)
GFR calc Af Amer: >60 mL/min (ref >60)
GFR calc non Af Amer: >60 mL/min (ref >60)
GLUCOSE: 96 mg/dL (ref 65–99)
Potassium: 4.5 mmol/L (ref 3.5–5.1)
Sodium: 139 mmol/L (ref 135–145)

## 2016-12-04 LAB — HEPATITIS C ANTIBODY (REFLEX): HCV Ab: 0.1 s/co ratio (ref 0.0–0.9)

## 2016-12-04 LAB — RPR: RPR Ser Ql: NONREACTIVE

## 2016-12-04 LAB — HCV COMMENT:

## 2016-12-04 LAB — HEPATITIS B SURFACE ANTIGEN: Hepatitis B Surface Ag: NEGATIVE

## 2016-12-04 LAB — GLUCOSE, CAPILLARY: Glucose-Capillary: 128 mg/dL — ABNORMAL HIGH (ref 65–99)

## 2016-12-04 LAB — HEPATITIS B SURFACE ANTIBODY,QUALITATIVE: Hep B S Ab: NONREACTIVE

## 2016-12-04 LAB — TSH: TSH: 0.892 u[IU]/mL (ref 0.350–4.500)

## 2016-12-04 LAB — HEPATITIS B CORE ANTIBODY, TOTAL: HEP B C TOTAL AB: NEGATIVE

## 2016-12-04 MED ORDER — SODIUM CHLORIDE 0.9 % IV SOLN
INTRAVENOUS | Status: DC
  Administered 2016-12-04 – 2016-12-05 (×3): via INTRAVENOUS

## 2016-12-04 NOTE — Progress Notes (Signed)
Internal Medicine Attending:   I saw and examined the patient. I reviewed the resident's note and I agree with the resident's findings and plan as documented in the resident's note.  36 year old man hospital day #2 with substance use disorder complicated by fever, tachycardia, and headache. Lumbar puncture performed yesterday without complications was normal, ruling out meningitis and encephalitis. This morning he still looks ill, sinus tachycardia while lying in bed, diaphoretic, headache and a cloudy thinking. He continues to clinically appear that he is withdrawing, however he denies any long-term use of opioids, benzos, or alcohol. He does admit to long-term use of cocaine, but cocaine withdrawal usually does not cause this kind of constellation of findings and symptoms. Plan to check TSH today. Continue to monitor blood cultures as he is at risk for endocarditis. Low threshold to start benzos if withdrawal symptoms worsen.

## 2016-12-04 NOTE — Progress Notes (Signed)
CM Referral for PCP follow up.  Pt had an appointment scheduled for 12/05/16 at 9:30.  Cancelled this appointment, as pt remains in hospital.  Attempted to reschedule appointment, but schedule is closed for this week.  Pt will need to call back on Friday, 8/24 to reschedule appointment for the following week.   Information put on AVS.  Quintella Baton, RN, BSN  Trauma/Neuro ICU Case Manager [phone]

## 2016-12-04 NOTE — Progress Notes (Signed)
   Subjective: No acute events overnight, he does note some nausea overnight with low amount of emesis, relieved with nausea. No recorded febrile temperatures through yesterday afternoon and this morning. Continues to have headache, bifrontal. On questioning if he may be in withdrawal from a substance, he responds it's possible but denies regular use of opiods, alochol, or benzodiazepines.   Objective:  Vital signs in last 24 hours: Vitals:   12/03/16 1330 12/03/16 1814 12/03/16 2100 12/04/16 0500  BP: 115/68  110/64 95/68  Pulse: (!) 110  86 91  Resp: (!) 21  18 18   Temp: 100.3 F (37.9 C) 99.6 F (37.6 C) 98.8 F (37.1 C) 99 F (37.2 C)  TempSrc: Oral Oral Oral Oral  SpO2: 94%  96% 94%  Weight:    156 lb (70.8 kg)  Height:       Physical Exam  Constitutional: He is oriented to person, place, and time. He appears well-developed.  Resting in bed, no acute distress   HENT:  Dry mucous membranes   Cardiovascular: Regular rhythm and intact distal pulses.   Tachycardic   Pulmonary/Chest: Effort normal. No respiratory distress.  Neurological: He is alert and oriented to person, place, and time.  Skin: Skin is warm and dry.    Assessment/Plan:  Altered Mental Status, Fever Substance Use Pt mental status has improved on exam, continues to endorse altered cognition from his baseline. He became febrile at 100.5 F and endorsed headache and neck pain also. With this constellation of symptoms, LP performed, CSF studies unremarkable. Recent HIV negative. RPR, Hep C negative. His symptoms may also be related to substance withdrawal (opiod, alcohol, bzd) though he denies substance use other than cocaine, THC. No response to Narcan, UDS negative for opiods though may not be sufficiently sensitive to rule out or include all possible substances. Tylenol, salicylate, ethanol levels normal. His symptoms continue to be consistent with a withdrawal syndrome, will continue to monitor for infection or  other causes. Blood cultures no growth at 24hrs.     --Hold home Losartan --Monitor vital signs  --BMP, CBC --F/u Hep B serology    HFrEF Initial complaints of chest pain. Troponin negative, no ischemic changes on EKG, D-dimer negative. Recent admission 8/14-8/15 for chest pain with low risk coronary CT, echo revealed EF 35-40% felt to be non-ischemic cardiomyopathy, possibly related to cocaine use. Started on ARB, beta blocker held given cocaine use. No signs of volume overload on exam.    Asthma History of asthma with numerous ED visits for exacerbation. On home Advair and albuterol. Received breathing treatments prior to arrival and in ED for wheezing. O2 sats dropped to 90% in ED.    --Continue home asthma medications, duoneb q6hr scheduled --O2 to keep sats >92%, wean as tolerated    Dispo: Anticipated discharge in approximately 1-2 day(s).   Ginger Carne, MD 12/04/2016, 7:35 AM Pager: [phone]

## 2016-12-04 NOTE — Clinical Social Work Note (Signed)
Clinical Social Work Assessment  Patient Details  Name: Joseph Huff MRN: [id number] Date of Birth: [birth year]  Date of referral:  12/04/16               Reason for consult:  Substance Use/ETOH Abuse                Permission sought to share information with:    Permission granted to share information::     Name::        Agency::     Relationship::     Contact Information:     Housing/Transportation Living arrangements for the past 2 months:  Single Family Home Source of Information:  Patient Patient Interpreter Needed:  None Criminal Activity/Legal Involvement Pertinent to Current Situation/Hospitalization:  No - Comment as needed Significant Relationships:  Spouse Lives with:  Spouse Do you feel safe going back to the place where you live?  Yes Need for family participation in patient care:  No (Coment)  Care giving concerns: Patient from home with wife. Consult for substance use.   Social Worker assessment / plan:  CSW met with patient at bedside and discussed substance use. Patient endorsed alcohol, cocaine, and "pain pill" use, as well as smoking cigarettes. Patient discussed having experienced a traumatic event two weeks ago, but did not want to share what the event was. Patient reported he has been using above named substances before the traumatic event regularly, but not everyday and not all together. Patient reports the use increased after the traumatic event and patient has had a hard time coping with the event. CSW and patient spoke at length abou patient's symptoms and experience since the event. Patient is clearly preoccupied and distracted by this event and finds himself dissociating while doing everyday activities. Patient is also having flashbacks and nightmares about the event. CSW and patient discussed treatment options for mental health and substance use. CSW provided lists of resources. Patient appreciative of resources, though not willing to commit to engage in  treatment at this time. CSW signing off but will be available if other social issues arise during patient's admission.  Employment status:  Therapist, music:  Medicaid In [place] PT Recommendations:  Not assessed at this time Information / Referral to community resources:  Outpatient Substance Abuse Treatment Options, Inpatient Psychiatric Care (Comment Required), Residential Substance Abuse Treatment Options, [facility] [place]  Patient/Family's Response to care: Patient appreciative of care and time spent talking.   Patient/Family's Understanding of and Emotional Response to Diagnosis, Current Treatment, and Prognosis: Patient seems to have good understanding of his medical condition, though with concern for anxiety and chest pains he has been experiencing.  Emotional Assessment Appearance:  Appears stated age Attitude/Demeanor/Rapport:  Other (appropriate) Affect (typically observed):  Quiet, Pleasant Orientation:  Oriented to Self, Oriented to Situation, Oriented to Place, Oriented to  Time Alcohol / Substance use:  Tobacco Use, Alcohol Use, Illicit Drugs Psych involvement (Current and /or in the community):  No (Comment)  Discharge Needs  Concerns to be addressed:  Substance Abuse Concerns Readmission within the last 30 days:  No Current discharge risk:  Substance Abuse Barriers to Discharge:  Continued Medical Work up   Estanislado Emms, LCSW 12/04/2016, 5:29 PM

## 2016-12-05 ENCOUNTER — Encounter: Payer: Self-pay | Admitting: Internal Medicine

## 2016-12-05 ENCOUNTER — Inpatient Hospital Stay: Payer: Medicaid Other | Admitting: Critical Care Medicine

## 2016-12-05 ENCOUNTER — Emergency Department (HOSPITAL_COMMUNITY)
Admission: EM | Admit: 2016-12-05 | Discharge: 2016-12-06 | Disposition: A | Discharge status: Home / Self Care | Payer: Medicaid Other | Attending: Emergency Medicine | Admitting: Emergency Medicine

## 2016-12-05 ENCOUNTER — Encounter (HOSPITAL_COMMUNITY): Payer: Self-pay | Admitting: *Deleted

## 2016-12-05 DIAGNOSIS — F1990 Other psychoactive substance use, unspecified, uncomplicated: Secondary | ICD-10-CM

## 2016-12-05 DIAGNOSIS — F17210 Nicotine dependence, cigarettes, uncomplicated: Secondary | ICD-10-CM | POA: Insufficient documentation

## 2016-12-05 DIAGNOSIS — J45909 Unspecified asthma, uncomplicated: Secondary | ICD-10-CM | POA: Insufficient documentation

## 2016-12-05 DIAGNOSIS — R51 Headache: Secondary | ICD-10-CM | POA: Diagnosis not present

## 2016-12-05 DIAGNOSIS — R519 Headache, unspecified: Secondary | ICD-10-CM

## 2016-12-05 DIAGNOSIS — I10 Essential (primary) hypertension: Secondary | ICD-10-CM | POA: Insufficient documentation

## 2016-12-05 DIAGNOSIS — J4521 Mild intermittent asthma with (acute) exacerbation: Secondary | ICD-10-CM

## 2016-12-05 DIAGNOSIS — R112 Nausea with vomiting, unspecified: Secondary | ICD-10-CM | POA: Insufficient documentation

## 2016-12-05 LAB — BASIC METABOLIC PANEL
Anion gap: 5 (ref 5–15)
BUN: 9 mg/dL (ref 6–20)
CHLORIDE: 109 mmol/L (ref 101–111)
CO2: 26 mmol/L (ref 22–32)
CREATININE: 1 mg/dL (ref 0.61–1.24)
Calcium: 8.4 mg/dL — ABNORMAL LOW (ref 8.9–10.3)
GFR calc Af Amer: >60 mL/min (ref >60)
GFR calc non Af Amer: >60 mL/min (ref >60)
Glucose, Bld: 89 mg/dL (ref 65–99)
Potassium: 4.2 mmol/L (ref 3.5–5.1)
Sodium: 140 mmol/L (ref 135–145)

## 2016-12-05 LAB — CBC
HEMATOCRIT: 40.8 % (ref 39.0–52.0)
Hemoglobin: 12.7 g/dL — ABNORMAL LOW (ref 13.0–17.0)
MCH: 26.7 pg (ref 26.0–34.0)
MCHC: 31.1 g/dL (ref 30.0–36.0)
MCV: 85.9 fL (ref 78.0–100.0)
PLATELETS: 201 10*3/uL (ref 150–400)
RBC: 4.75 MIL/uL (ref 4.22–5.81)
RDW: 13.8 % (ref 11.5–15.5)
WBC: 10.1 10*3/uL (ref 4.0–10.5)

## 2016-12-05 MED ORDER — DEXAMETHASONE SODIUM PHOSPHATE 10 MG/ML IJ SOLN
10.0000 mg | Freq: Once | INTRAMUSCULAR | Status: AC
  Administered 2016-12-06: 10 mg via INTRAVENOUS
  Filled 2016-12-05: qty 1

## 2016-12-05 MED ORDER — IBUPROFEN 400 MG PO TABS
ORAL_TABLET | ORAL | Status: AC
  Filled 2016-12-05: qty 1

## 2016-12-05 MED ORDER — KETOROLAC TROMETHAMINE 30 MG/ML IJ SOLN
30.0000 mg | Freq: Once | INTRAMUSCULAR | Status: AC
  Administered 2016-12-06: 30 mg via INTRAVENOUS
  Filled 2016-12-05: qty 1

## 2016-12-05 MED ORDER — PROCHLORPERAZINE EDISYLATE 5 MG/ML IJ SOLN
10.0000 mg | Freq: Once | INTRAMUSCULAR | Status: AC
  Administered 2016-12-06: 10 mg via INTRAVENOUS
  Filled 2016-12-05: qty 2

## 2016-12-05 MED ORDER — SODIUM CHLORIDE 0.9 % IV BOLUS (SEPSIS)
500.0000 mL | Freq: Once | INTRAVENOUS | Status: AC
  Administered 2016-12-06: 500 mL via INTRAVENOUS

## 2016-12-05 MED ORDER — IBUPROFEN 400 MG PO TABS
400.0000 mg | ORAL_TABLET | Freq: Once | ORAL | Status: AC | PRN
  Administered 2016-12-05: 400 mg via ORAL

## 2016-12-05 NOTE — Plan of Care (Signed)
Problem: Activity: Goal: Risk for activity intolerance will decrease Outcome: Completed/Met Date Met: 12/05/16 Patient's ambulates freely in room and tolerates well.   Problem: Fluid Volume: Goal: Ability to maintain a balanced intake and output will improve Outcome: Completed/Met Date Met: 12/05/16 Patient able to tolerate regular diet.   Problem: Nutrition: Goal: Adequate nutrition will be maintained Outcome: Completed/Met Date Met: 12/05/16 Patient is able to tolerate a regular diet.

## 2016-12-05 NOTE — Discharge Summary (Signed)
Name: Joseph Huff MRN: [id number] DOB: Dec 26, 1980 36 y.o. PCP: Patient, No Pcp Per  Date of Admission: 12/02/2016  3:02 AM Date of Discharge: 12/05/2016 Attending Physician: Tyson Alias, *  Discharge Diagnosis: Active Problems:   Shortness of breath   Mild intermittent asthma with exacerbation   Nonischemic cardiomyopathy (HCC)   Substance use disorder   Discharge Medications: Allergies as of 12/05/2016   No Known Allergies     Medication List    STOP taking these medications   losartan 25 MG tablet Commonly known as:  COZAAR     TAKE these medications   albuterol 108 (90 Base) MCG/ACT inhaler Commonly known as:  PROVENTIL HFA;VENTOLIN HFA Inhale 2 puffs into the lungs every 4 (four) hours as needed for wheezing or shortness of breath.   aspirin 81 MG EC tablet Take 1 tablet (81 mg total) by mouth daily.   Fluticasone-Salmeterol 250-50 MCG/DOSE Aepb Commonly known as:  ADVAIR DISKUS Inhale 1 puff into the lungs 2 (two) times daily.            Discharge Care Instructions        Start     Ordered   12/05/16 0000  Discharge instructions    Comments:  -Make a follow up appointment with the [facility] soon. The number is provided -You also have an appointment with your heart doctor coming up on Aug 31st at 10:30 am. Stop taking your Losartan until you see them   -We have ruled out any sort of infection that may have been causing your symptoms. Try to cut back on using substances such as cocaine and other drugs as that could have also caused your symptoms   12/05/16 1156      Disposition and follow-up:   Joseph Huff was discharged from [facility] in Stable condition.  At the hospital follow up visit please address:  1.  -Assess for continued resolution of fever, tachycardia -Counsel on substance use  -Assess readiness for mental health intervention, discussing details of reported life event -Address  asthma control    2.  Labs / imaging needed at time of follow-up: None  3.  Pending labs/ test needing follow-up: Blood cultures to [facility]   Follow-up Appointments: Follow-up Information    [facility] Follow up.   Why:  Please call clinic on 12/07/16 to schedule appointment for PCP/hospital follow up.   Contact information: [address] [place] [address] [phone]          Hospital Course by problem list:   Suspected Withdrawal Syndrome, Substance Use  No evidence of infection Presented with drowsiness, lethargy and complaints of "not thinking right". On subsequent questioning he also endorsed recent headache and neck pain. On day 2 of his stay he became febrile to 100.5. With this constellation of symptoms, an LP was performed to rule out CNS infection, CSF studies unremarkable. He was also tachycardic, blood cultures drawn to rule out other infection. In the absence of infection, withdrawal explained his symptoms, especially withdrawal from alcohol, benzodiazepine, or opiate use. UDS was positive for cocaine, otherwise negative, he did not respond to Narcan on ED presentation, Tylenol, salicylate, and ethanol levels negative on admission. On repeat questioning of his substance use history, he admitted to cocaine use via nasal inhalation, noted taking unknown pain pills in the last two days but not regularly. His responses were vague and varied at different times. He defervesced,  tachycardia improved, blood cultures no growth x 2 days, other infectious workup returned negative (RPR, hepatitis, recent HIV negative). Though no clear diagnosis was obtained, in light of negative infectious workup it is felt his presenting symptoms and hospital course were due to withdrawal of an unknown substance that was not forthcoming in patient reports. He was discharged in stable condition and will follow up with [facility]     Asthma Prior to arrival and in the early stages of his admission he was noted to have wheezing and increased work of breathing, no significant oxygen desaturations or requirement. He has a known history of asthma with prior ED evaluations for exacerbations. Equivalent home asthma medications were continued and he received schedule duonebs early in the admission with resolution of his wheezing.   HFrEF Recently admitted 8/14-8/15 with chest pain, found to have EF 35-40% felt to be non-ischemic cardiomyopathy likely related to substance use, low risk coronary CT for ischemic etiology. He had been recently started on low dose ARB, no beta blocker due to cocaine use. He initially complained of chest pain on presentation, troponin negative, D-dimer negative, no ischemia on EKG, and his chest pain quickly resolved. His ARB was held during admission for soft blood pressures. Also held on discharge to be addressed at upcoming Cardiology appointment.   Discharge Vitals:   BP 111/68 (BP Location: Left Arm)   Pulse 90   Temp 98.4 F (36.9 C) (Oral)   Resp 18   Ht 6' 0.01" (1.829 m)   Wt 157 lb 3.2 oz (71.3 kg)   SpO2 97%   BMI 21.32 kg/m   Pertinent Labs, Studies, and Procedures:  CSF WBC, RBC, Protein, Glucose unremarkable Gram Stain: No WBC, no organism, Culture: no growth x 2 days  Blood Cultures No Growth x 2 days (2/2 samples)   Discharge Instructions: Discharge Instructions    Discharge instructions    Complete by:  As directed    -Make a follow up appointment with the [facility] soon. The number is provided -You also have an appointment with your heart doctor coming up on Aug 31st at 10:30 am. Stop taking your Losartan until you see them   -We have ruled out any sort of infection that may have been causing your symptoms. Try to cut back on using substances such as cocaine and other drugs as that could have also caused your symptoms      Signed: Ginger Carne, MD 12/05/2016, 11:57 AM   Pager: [phone]

## 2016-12-05 NOTE — ED Triage Notes (Signed)
Pt ambulatory to triage room. Pt was d/c from inpatient unit today. Pt says since then he has had a splitting headache and cramping in his back. Pt said he was going to go home to rest to see if it got better and has been waiting on his ride to come get him and they can't come get him until the morning.

## 2016-12-05 NOTE — Plan of Care (Signed)
Problem: Education: Goal: Knowledge of [facility] Education information/materials will improve Outcome: Completed/Met Date Met: 12/05/16 Patient educated from [place] about substance abuse resources.

## 2016-12-05 NOTE — Plan of Care (Signed)
Problem: Health Behavior/Discharge Planning: Goal: Ability to manage health-related needs will improve Outcome: Completed/Met Date Met: 12/05/16 Patient being discharged by MD order. All discharged instructions will be given verbally and in written format.

## 2016-12-05 NOTE — ED Notes (Signed)
Patient transported to CT 

## 2016-12-05 NOTE — Progress Notes (Signed)
   Subjective: No acute events overnight, he has remained afebrile with improved heart rate through the night. He met with social work yesterday and noted a traumatic event, though continued to remain vague on details of event and substance use, he declined pursuing a mental health provider.    Objective:  Vital signs in last 24 hours: Vitals:   12/04/16 2142 12/05/16 0500 12/05/16 0906 12/05/16 0917  BP:  111/68    Pulse:  82 90   Resp:  18 18   Temp:  98.4 F (36.9 C)    TempSrc:  Oral    SpO2: 96% 97% 98% 97%  Weight:  157 lb 3.2 oz (71.3 kg)    Height:       Physical Exam  Constitutional: He is oriented to person, place, and time. He appears well-developed.  Resting in bed, no acute distress   HENT:  Dry mucous membranes   Cardiovascular: Regular rhythm and intact distal pulses.   Tachycardic   Pulmonary/Chest: Effort normal. No respiratory distress.  Neurological: He is alert and oriented to person, place, and time.  Skin: Skin is warm and dry.    Assessment/Plan:  Altered Mental Status, Fever Substance Use Pt mental status has improved on exam, continues to endorse altered cognition from his baseline. He became febrile at 100.5 F and endorsed headache and neck pain also. With this constellation of symptoms, LP performed, CSF studies unremarkable. Recent HIV negative. RPR, Hep C negative. His symptoms may also be related to substance withdrawal (opiod, alcohol, bzd) though he denies substance use other than cocaine, THC. No response to Narcan, UDS negative for opiods though may not be sufficiently sensitive to rule out or include all possible substances. Tylenol, salicylate, ethanol levels normal. His symptoms continue to be consistent with a withdrawal syndrome, will continue to monitor for infection or other causes. Blood cultures no growth after 2 days.      --Hold home Losartan --Monitor vital signs  --BMP, CBC --F/u Hep B serology    HFrEF Initial complaints of  chest pain. Troponin negative, no ischemic changes on EKG, D-dimer negative. Recent admission 8/14-8/15 for chest pain with low risk coronary CT, echo revealed EF 35-40% felt to be non-ischemic cardiomyopathy, possibly related to cocaine use. Started on ARB, beta blocker held given cocaine use. No signs of volume overload on exam.    Asthma History of asthma with numerous ED visits for exacerbation. On home Advair and albuterol. Received breathing treatments prior to arrival and in ED for wheezing. O2 sats dropped to 90% in ED.    --Continue home asthma medications, duoneb q6hr scheduled --O2 to keep sats >92%, wean as tolerated    Dispo: Anticipated discharge in approximately 0-1 day(s).   Tawny Asal, MD 12/05/2016, 11:40 AM Pager: [phone]

## 2016-12-06 ENCOUNTER — Emergency Department (HOSPITAL_COMMUNITY): Payer: Medicaid Other

## 2016-12-06 ENCOUNTER — Encounter (HOSPITAL_COMMUNITY): Payer: Self-pay | Admitting: Emergency Medicine

## 2016-12-06 ENCOUNTER — Encounter (HOSPITAL_COMMUNITY): Payer: Self-pay | Admitting: Radiology

## 2016-12-06 ENCOUNTER — Other Ambulatory Visit (HOSPITAL_COMMUNITY): Payer: Self-pay

## 2016-12-06 LAB — CSF CULTURE
CULTURE: NO GROWTH
GRAM STAIN: NONE SEEN

## 2016-12-06 LAB — CSF CULTURE W GRAM STAIN

## 2016-12-06 NOTE — ED Triage Notes (Signed)
Patient arrives with complaint of continued headache. States onset after being discharged from hospital 2 days ago. Rocking and complaining of severe head pain in triage. Also endorses nausea and emesis. Temperature normal.

## 2016-12-06 NOTE — ED Provider Notes (Signed)
[facility]-EMERGENCY DEPT Provider Note   CSN: [id number] Arrival date & time: 12/05/16  2118     History   Chief Complaint Chief Complaint  Patient presents with  . Headache    HPI Joseph Huff is a 36 y.o. male with history of asthma, cocaine use disorder, marijuana use disorder, panic disorder, nonischemic cardiomyopathy presents to the ED for evaluation of headache, nausea, vomiting and lower back pain that started less than 24 hours ago while patient was still admitted at [facility] hospital.  Pt was admitted on 12/02/16 when he presented to the ED for chest pain, AMS, headache and neck pain. He had a lumbar puncture while in the hospital. Patient was discharged from hospital earlier today, he states his symptoms started while he was still in the hospital. Last cocaine use 5 days ago. Occasional ETOH use. Denies benzo use.   HPI  Past Medical History:  Diagnosis Date  . Asthma   . Hypertension   . Panic attack ?2017 X 1  . Pneumonia ~ 2016    Patient Active Problem List   Diagnosis Date Noted  . Substance use disorder 12/05/2016  . Nonischemic cardiomyopathy (HCC) 11/28/2016  . Mild intermittent asthma with exacerbation   . Pleuritic chest pain 11/27/2016  . Shortness of breath   . Mild intermittent asthma with acute exacerbation   . Foreign body of pharynx 08/12/2016  . Dysphagia 08/12/2016    Past Surgical History:  Procedure Laterality Date  . DIRECT LARYNGOSCOPY N/A 08/12/2016   Procedure: LARYNGOSCOPY AND CERVICAL ESOPHOGOSCOPY;  Surgeon: Osborn Coho, MD;  Location: [facility] OR;  Service: ENT;  Laterality: N/A;       Home Medications    Prior to Admission medications   Medication Sig Start Date End Date Taking? Authorizing Provider  albuterol (PROVENTIL HFA;VENTOLIN HFA) 108 (90 Base) MCG/ACT inhaler Inhale 2 puffs into the lungs every 4 (four) hours as needed for wheezing or shortness of breath. 11/28/16   Strader, Lennart Pall, PA-C  aspirin 81 MG EC tablet Take 1 tablet  (81 mg total) by mouth daily. 11/29/16   Strader, Lennart Pall, PA-C  Fluticasone-Salmeterol (ADVAIR DISKUS) 250-50 MCG/DOSE AEPB Inhale 1 puff into the lungs 2 (two) times daily. 11/28/16 11/28/17  Ellsworth Lennox, PA-C    Family History Family History  Problem Relation Age of Onset  . Hypertension Maternal Uncle   . Diabetes Maternal Uncle     Social History Social History  Substance Use Topics  . Smoking status: Current Every Day Smoker    Packs/day: 0.10    Years: 11.00    Types: Cigarettes  . Smokeless tobacco: Never Used  . Alcohol use Yes     Comment: 11/27/2016 "I'll have a beer q couple months"     Allergies   Patient has no known allergies.   Review of Systems Review of Systems  Constitutional: Positive for chills and fever.  Eyes: Negative for visual disturbance.  Respiratory: Negative for shortness of breath.   Cardiovascular: Negative for chest pain.  Gastrointestinal: Positive for nausea and vomiting.  Genitourinary: Negative for difficulty urinating.  Musculoskeletal: Positive for back pain.  Neurological: Positive for headaches.  Psychiatric/Behavioral: The patient is nervous/anxious.      Physical Exam Updated Vital Signs BP 122/83 (BP Location: Left Arm)   Pulse 99   Temp 97.8 F (36.6 C) (Oral)   Resp (!) 22   SpO2 99%   Physical Exam  Constitutional: He is oriented to person, place, and time. He appears  well-developed and well-nourished. No distress.  Anxious appearing, soft spoken.  HENT:  Head: Normocephalic and atraumatic.  Right Ear: External ear normal.  Left Ear: External ear normal.  Nose: Nose normal.  Moist mucous membranes Normal oropharynx and tonsils  Eyes: Conjunctivae and EOM are normal. No scleral icterus.  Neck: Normal range of motion. Neck supple.  No cervical adenopathy   Cardiovascular: Normal rate, regular rhythm, normal heart sounds and intact distal pulses.   No murmur heard. Pulmonary/Chest: Effort normal and  breath sounds normal. He has no wheezes.  Abdominal: Soft. There is no tenderness.  Soft NTND Negative Murphy's and McBurney's No suprapubic or CVA tenderness No epigastric tenderness  Musculoskeletal: Normal range of motion. He exhibits no deformity.  Neurological: He is alert and oriented to person, place, and time.  A&O to self, place and time. Speech and phonation normal.   Thought process coherent.   Strength 5/5 in upper and lower extremities.   Sensation to light touch intact in upper and lower extremities.  Gait normal.   Intact finger to nose test. CN I not tested CN II full visual fields  CN III, IV, VI PEERL and EOMs intact bilaterally CN V light touch intact in all 3 divisions of trigeminal nerve CN VII facial nerve movements intact, symmetric, bilaterally CN VIII hearing intact to finger rub, bilaterally CN IX, X no uvula deviation, symmetric soft palate rise CN XI 5/5 SCM and trapezius strength bilaterally  CN XII Tongue midline with symmetric L/R movement  Skin: Capillary refill takes less than 2 seconds.  Dry and warm bandaid covering LP entry site, no local tenderness, warmth, fluctuance or cellulitis   Psychiatric: He has a normal mood and affect. His behavior is normal. Judgment and thought content normal.  Nursing note and vitals reviewed.    ED Treatments / Results  Labs (all labs ordered are listed, but only abnormal results are displayed) Labs Reviewed - No data to display  EKG  EKG Interpretation None       Radiology Ct Head Wo Contrast  Result Date: 12/06/2016 CLINICAL DATA:  Headache. Onset after hospital discharge today for shortness of breath. EXAM: CT HEAD WITHOUT CONTRAST TECHNIQUE: Contiguous axial images were obtained from the base of the skull through the vertex without intravenous contrast. COMPARISON:  None. FINDINGS: Brain: No intracranial hemorrhage, mass effect, or midline shift. No hydrocephalus. The basilar cisterns are patent. No  evidence of territorial infarct or acute ischemia. No extra-axial or intracranial fluid collection. Vascular: No hyperdense vessel or unexpected calcification. Skull:  No skull fracture or focal lesion. Sinuses/Orbits: Mucosal thickening of the ethmoid air cells. Mucous retention cysts in right and left maxillary sinus, left maxillary sinus mucosal thickening and small fluid level. Mastoid air cells are well-aerated. Visualized orbits are normal. Other: None. IMPRESSION: 1. Normal intracranial. 2. Paranasal sinus inflammation, possibly acute. Electronically Signed   By: Rubye Oaks M.D.   On: 12/06/2016 00:18    Procedures Procedures (including critical care time)  Medications Ordered in ED Medications  ibuprofen (ADVIL,MOTRIN) tablet 400 mg (400 mg Oral Given 12/05/16 2221)  sodium chloride 0.9 % bolus 500 mL (0 mLs Intravenous Stopped 12/06/16 0109)  prochlorperazine (COMPAZINE) injection 10 mg (10 mg Intravenous Given 12/06/16 0030)  ketorolac (TORADOL) 30 MG/ML injection 30 mg (30 mg Intravenous Given 12/06/16 0030)  dexamethasone (DECADRON) injection 10 mg (10 mg Intravenous Given 12/06/16 0030)     Initial Impression / Assessment and Plan / ED Course  I have reviewed  the triage vital signs and the nursing notes.  Pertinent labs & imaging results that were available during my care of the patient were reviewed by me and considered in my medical decision making (see chart for details).    36 year old male presents to the ED immediately after being discharged from [facility] for evaluation of headache, nausea, vomiting 2 that started while he was still in the hospital < 24 hr ago. He was admitted for AMS, headache, neck pain in setting of cocaine and unknown pill ingestion this past week, discharged today. Patient came to ED waiting room after being discharged so he could see his ride pull in however his ride told him that he had to wait until the morning. Patient stated "I couldn't wait in  the waiting room with this headache" and decided to check in for evaluation. No previous h/o headaches in the past. Had an LP done on 2 days ago. Frequent ED visits.  Neuro exam reassuring. Abdomen is benign w/o epigastric, McBurney's or Murphy's tenderness. VS without fever, hypotension or tachycardia.   Considered CNS process including ICH or meningitis causing headache vs pancreatitis, cholecystitis, appendicitis causing N/V. Given h/o polysubstance abuse also considering withdrawal. However, pt's most recent inpatient work up excludes a lot of these diagnosis.  Recent ED work up (12/02/16) and inpatient work up at last admission (8/19-8/22) was reviewed. UDS only positive for cocaine, ETOH, ASA, APAP levels normal. Most recent CBC and BMP today were normal, no leukocytosis. Pt developed fever while inpatient so extensive infectious work up was initiated including LP and CSF studies, blood cx, RPR, hepatitis, HIV and U/A which were all negative. CXR negative.   Given persistent HA, neck pain and constellation of recent s/s that required admission will get CT scan head which was not done as inpatient. Will start migraine cocktail and reassess.   Final Clinical Impressions(s) / ED Diagnoses   0110: Reassessed patient after migraine cocktail, reports significant relief of headache. Discussed recent and normal extensive work up with patient.  He is considered safe for discharge at this time. Repeat neuro exam normal. Abdomen soft, NTND. He has f/u with cardiology and information for community clinic.   Final diagnoses:  Bad headache  Nausea and vomiting in adult    New Prescriptions Discharge Medication List as of 12/06/2016  1:11 AM       Liberty Handy, PA-C 12/06/16 0121    Mancel Bale, MD 12/07/16 [phone]

## 2016-12-06 NOTE — Discharge Instructions (Signed)
You were evaluated in the emergency department after being discharged from the hospital for headache, nausea and vomiting. Your most recent hospital workup has been normal. CT scan of your head done today was also normal. The most likely reason of your headache is the recent lumbar puncture. The headache is a common side effect from this procedure. After your headache improved here were considered safe for discharge. Take Tylenol or ibuprofen for pain as needed. Follow-up with your scheduled appointment with cardiology on August 31st.   Contact [facility] to establish care with a primary care provider for regular, routine medical care.  This clinic accepts patients without medical insurance. A primary care provider can adjust your daily medications and give you refills.

## 2016-12-07 ENCOUNTER — Emergency Department (HOSPITAL_COMMUNITY)
Admission: EM | Admit: 2016-12-07 | Discharge: 2016-12-07 | Disposition: A | Discharge status: Home / Self Care | Payer: Medicaid Other | Source: Home / Self Care | Attending: Emergency Medicine | Admitting: Emergency Medicine

## 2016-12-07 DIAGNOSIS — R51 Headache: Principal | ICD-10-CM

## 2016-12-07 DIAGNOSIS — R519 Headache, unspecified: Secondary | ICD-10-CM

## 2016-12-07 MED ORDER — DEXAMETHASONE SODIUM PHOSPHATE 10 MG/ML IJ SOLN
10.0000 mg | Freq: Once | INTRAMUSCULAR | Status: AC
  Administered 2016-12-07: 10 mg via INTRAVENOUS
  Filled 2016-12-07: qty 1

## 2016-12-07 MED ORDER — BUTALBITAL-APAP-CAFFEINE 50-325-40 MG PO TABS: 1.0000 | ORAL_TABLET | Freq: Four times a day (QID) | ORAL | 0 refills | Status: AC | PRN

## 2016-12-07 MED ORDER — DIPHENHYDRAMINE HCL 50 MG/ML IJ SOLN
25.0000 mg | Freq: Once | INTRAMUSCULAR | Status: AC
  Administered 2016-12-07: 25 mg via INTRAVENOUS
  Filled 2016-12-07: qty 1

## 2016-12-07 MED ORDER — SODIUM CHLORIDE 0.9 % IV BOLUS (SEPSIS)
1000.0000 mL | Freq: Once | INTRAVENOUS | Status: AC
  Administered 2016-12-07: 1000 mL via INTRAVENOUS

## 2016-12-07 MED ORDER — IBUPROFEN 400 MG PO TABS
ORAL_TABLET | ORAL | Status: AC
  Filled 2016-12-07: qty 2

## 2016-12-07 MED ORDER — IBUPROFEN 400 MG PO TABS
800.0000 mg | ORAL_TABLET | Freq: Once | ORAL | Status: AC
  Administered 2016-12-07: 800 mg via ORAL

## 2016-12-07 MED ORDER — METOCLOPRAMIDE HCL 5 MG/ML IJ SOLN
10.0000 mg | Freq: Once | INTRAMUSCULAR | Status: AC
  Administered 2016-12-07: 10 mg via INTRAVENOUS
  Filled 2016-12-07: qty 2

## 2016-12-07 NOTE — ED Notes (Signed)
Pt taken back to triage to be reassised

## 2016-12-07 NOTE — ED Triage Notes (Signed)
Consulted MD [place] ref: continued headache. MD advises no need for head CT at this time.

## 2016-12-07 NOTE — Discharge Instructions (Signed)
Please read and follow all provided instructions.  Your diagnoses today include:  1. Acute nonintractable headache, unspecified headache type     Tests performed today include:  Vital signs. See below for your results today.   Medications:  In the Emergency Department you received:  Reglan - antinausea/headache medication  Benadryl - antihistamine to counteract potential side effects of reglan  Decadron - steroid medication  Take any prescribed medications only as directed.  Additional information:  Follow any educational materials contained in this packet.  You are having a headache. No specific cause was found today for your headache. It may have been a migraine or other cause of headache. Stress, anxiety, fatigue, and depression are common triggers for headaches.   Your headache today does not appear to be life-threatening or require hospitalization, but often the exact cause of headaches is not determined in the emergency department. Therefore, follow-up with your doctor is very important to find out what may have caused your headache and whether or not you need any further diagnostic testing or treatment.   Sometimes headaches can appear benign (not harmful), but then more serious symptoms can develop which should prompt an immediate re-evaluation by your doctor or the emergency department.  BE VERY CAREFUL not to take multiple medicines containing Tylenol (also called acetaminophen). Doing so can lead to an overdose which can damage your liver and cause liver failure and possibly death.   Follow-up instructions: Please follow-up with your primary care provider in the next 7 days for further evaluation of your symptoms.   Return instructions:   Please return to the Emergency Department if you experience worsening symptoms.  Return if the medications do not resolve your headache, if it recurs, or if you have multiple episodes of vomiting or cannot keep down  fluids.  Return if you have a change from the usual headache.  RETURN IMMEDIATELY IF you:  Develop a sudden, severe headache  Develop confusion or become poorly responsive or faint  Develop a fever above 100.89F or problem breathing  Have a change in speech, vision, swallowing, or understanding  Develop new weakness, numbness, tingling, incoordination in your arms or legs  Have a seizure  Please return if you have any other emergent concerns.  Additional Information:  Your vital signs today were: BP 101/64    Pulse (!) 57    Temp 97.9 F (36.6 C) (Oral)    Resp 16    Ht 6' (1.829 m)    Wt 71.2 kg (157 lb)    SpO2 96%    BMI 21.29 kg/m  If your blood pressure (BP) was elevated above 135/85 this visit, please have this repeated by your doctor within one month. --------------

## 2016-12-07 NOTE — ED Provider Notes (Signed)
[facility]-EMERGENCY DEPT Provider Note   CSN: [id number] Arrival date & time: 12/06/16  2322     History   Chief Complaint Chief Complaint  Patient presents with  . Headache    HPI Joseph Huff is a 36 y.o. male.  Patient with history of multiple emergency department visits, recent admission 8/14-8/15/18 due to elevated troponins, found to have an ejection fraction of 35-40% in the setting of cocaine use, low risk coronary CT performed at that time; HIV, RPR, hepatitis B/C also negative. Second admission until 8/22 for shortness of breath. LP performed due to headache and back pain, studies negative, blood cultures negative. D/c 8/22. Seen again in emergency department on 8/23 for headache. This was treated with Compazine, Benadryl, and Toradol with improvement. Patient returns this morning.  Patient complains of a generalized headache that is not positional in nature. He has associated vomiting and photophobia. No fevers or neck pain today. No confusion. No skin rashes or reported tick bites. No treatments prior to arrival. Patient states that he is currently living in [facility] in a house with his wife and child. The onset of this condition was acute. The course is constant. Alleviating factors: none.        Past Medical History:  Diagnosis Date  . Asthma   . Hypertension   . Panic attack ?2017 X 1  . Pneumonia ~ 2016    Patient Active Problem List   Diagnosis Date Noted  . Substance use disorder 12/05/2016  . Nonischemic cardiomyopathy (HCC) 11/28/2016  . Mild intermittent asthma with exacerbation   . Pleuritic chest pain 11/27/2016  . Shortness of breath   . Mild intermittent asthma with acute exacerbation   . Foreign body of pharynx 08/12/2016  . Dysphagia 08/12/2016    Past Surgical History:  Procedure Laterality Date  . DIRECT LARYNGOSCOPY N/A 08/12/2016   Procedure: LARYNGOSCOPY AND CERVICAL ESOPHOGOSCOPY;  Surgeon: Osborn Coho, MD;  Location: [facility] OR;  Service:  ENT;  Laterality: N/A;       Home Medications    Prior to Admission medications   Medication Sig Start Date End Date Taking? Authorizing Provider  albuterol (PROVENTIL HFA;VENTOLIN HFA) 108 (90 Base) MCG/ACT inhaler Inhale 2 puffs into the lungs every 4 (four) hours as needed for wheezing or shortness of breath. 11/28/16  Yes Strader, Lennart Pall, PA-C  aspirin 81 MG EC tablet Take 1 tablet (81 mg total) by mouth daily. 11/29/16  Yes Strader, [place] M, PA-C  Fluticasone-Salmeterol (ADVAIR DISKUS) 250-50 MCG/DOSE AEPB Inhale 1 puff into the lungs 2 (two) times daily. 11/28/16 11/28/17 Yes Strader, Lennart Pall, PA-C    Family History Family History  Problem Relation Age of Onset  . Hypertension Maternal Uncle   . Diabetes Maternal Uncle     Social History Social History  Substance Use Topics  . Smoking status: Current Every Day Smoker    Packs/day: 0.10    Years: 11.00    Types: Cigarettes  . Smokeless tobacco: Never Used  . Alcohol use Yes     Comment: 11/27/2016 "I'll have a beer q couple months"     Allergies   Patient has no known allergies.   Review of Systems Review of Systems  Constitutional: Negative for fever.  HENT: Negative for congestion, dental problem, rhinorrhea and sinus pressure.   Eyes: Positive for photophobia. Negative for discharge, redness and visual disturbance.  Respiratory: Positive for cough. Negative for shortness of breath.   Cardiovascular: Negative for chest pain.  Gastrointestinal: Positive  for nausea and vomiting.  Musculoskeletal: Negative for gait problem, neck pain and neck stiffness.  Skin: Negative for rash.  Neurological: Positive for headaches. Negative for syncope, speech difficulty, weakness, light-headedness and numbness.  Psychiatric/Behavioral: Negative for confusion.     Physical Exam Updated Vital Signs BP 104/63 (BP Location: Left Arm)   Pulse 66   Temp 97.9 F (36.6 C) (Oral)   Resp 16   Ht 6' (1.829 m)   Wt 71.2  kg (157 lb)   SpO2 98%   BMI 21.29 kg/m   Physical Exam  Constitutional: He is oriented to person, place, and time. He appears well-developed and well-nourished.  HENT:  Head: Normocephalic and atraumatic.  Right Ear: Tympanic membrane, external ear and ear canal normal.  Left Ear: Tympanic membrane, external ear and ear canal normal.  Nose: Nose normal.  Mouth/Throat: Uvula is midline, oropharynx is clear and moist and mucous membranes are normal.  Eyes: Pupils are equal, round, and reactive to light. Conjunctivae, EOM and lids are normal.  Neck: Normal range of motion. Neck supple.  Cardiovascular: Normal rate and regular rhythm.   Pulmonary/Chest: Effort normal and breath sounds normal.  Abdominal: Soft. There is no tenderness.  Musculoskeletal: Normal range of motion.       Cervical back: He exhibits normal range of motion, no tenderness and no bony tenderness.  Site of previous lumbar puncture is clean without erythema or tenderness. No active drainage. Bandage replaced.   Neurological: He is alert and oriented to person, place, and time. He has normal strength and normal reflexes. No cranial nerve deficit or sensory deficit. He exhibits normal muscle tone. He displays a negative Romberg sign. Coordination and gait normal. GCS eye subscore is 4. GCS verbal subscore is 5. GCS motor subscore is 6.  Skin: Skin is warm and dry.  Psychiatric: He has a normal mood and affect.  Nursing note and vitals reviewed.    ED Treatments / Results  Labs (all labs ordered are listed, but only abnormal results are displayed) Labs Reviewed - No data to display  EKG  EKG Interpretation None       Radiology Ct Head Wo Contrast  Result Date: 12/06/2016 CLINICAL DATA:  Headache. Onset after hospital discharge today for shortness of breath. EXAM: CT HEAD WITHOUT CONTRAST TECHNIQUE: Contiguous axial images were obtained from the base of the skull through the vertex without intravenous contrast.  COMPARISON:  None. FINDINGS: Brain: No intracranial hemorrhage, mass effect, or midline shift. No hydrocephalus. The basilar cisterns are patent. No evidence of territorial infarct or acute ischemia. No extra-axial or intracranial fluid collection. Vascular: No hyperdense vessel or unexpected calcification. Skull:  No skull fracture or focal lesion. Sinuses/Orbits: Mucosal thickening of the ethmoid air cells. Mucous retention cysts in right and left maxillary sinus, left maxillary sinus mucosal thickening and small fluid level. Mastoid air cells are well-aerated. Visualized orbits are normal. Other: None. IMPRESSION: 1. Normal intracranial. 2. Paranasal sinus inflammation, possibly acute. Electronically Signed   By: Rubye Oaks M.D.   On: 12/06/2016 00:18    Procedures Procedures (including critical care time)  Medications Ordered in ED Medications  ibuprofen (ADVIL,MOTRIN) tablet 800 mg (800 mg Oral Given 12/07/16 0321)  sodium chloride 0.9 % bolus 1,000 mL (0 mLs Intravenous Stopped 12/07/16 0932)  metoCLOPramide (REGLAN) injection 10 mg (10 mg Intravenous Given 12/07/16 0808)  diphenhydrAMINE (BENADRYL) injection 25 mg (25 mg Intravenous Given 12/07/16 0806)  dexamethasone (DECADRON) injection 10 mg (10 mg Intravenous Given 12/07/16  4098)     Initial Impression / Assessment and Plan / ED Course  I have reviewed the triage vital signs and the nursing notes.  Pertinent labs & imaging results that were available during my care of the patient were reviewed by me and considered in my medical decision making (see chart for details).     Patient seen and examined. Work-up initiated. Medications ordered. Pt with recent extensive work-up including cultures, LP, head CT. No neuro deficits on exam today or reported changes in his exam which I feel would necessitate repeat testing today. Will treat with HA cocktail, give fluids, and re-exam.   Vital signs reviewed and are as follows: BP 104/63 (BP  Location: Left Arm)   Pulse 66   Temp 97.9 F (36.6 C) (Oral)   Resp 16   Ht 6' (1.829 m)   Wt 71.2 kg (157 lb)   SpO2 98%   BMI 21.29 kg/m   9:49 AM Patient with improved symptoms, appears more comfortable.   Will d/c. Encouraged pt to rest at home.   Patient counseled to return if they have weakness in their arms or legs, slurred speech, trouble walking or talking, confusion, trouble with their balance, or if they have any other concerns. Also return with confusion/fever. Patient verbalizes understanding and agrees with plan.    Final Clinical Impressions(s) / ED Diagnoses   Final diagnoses:  Acute nonintractable headache, unspecified headache type   Patient without high-risk features of headache including: sudden onset/thunderclap HA, no similar headache in past, altered mental status, accompanying seizure, headache with exertion, age > 37, history of immunocompromise, neck or shoulder pain, fever, use of anticoagulation, family history of spontaneous SAH, toxic exposure.   Patient has a normal complete neurological exam, normal vital signs, normal level of consciousness, no signs of meningismus, is well-appearing/non-toxic appearing, no signs of trauma.   Extensive recent work-up with CT, LP. These were negative. No new or changing neuro deficits to indicate re-testing today. Improved with migraine cocktail, home with fioricet.   No dangerous or life-threatening conditions suspected or identified by history, physical exam, and by work-up. No indications for hospitalization identified.    New Prescriptions New Prescriptions   BUTALBITAL-ACETAMINOPHEN-CAFFEINE (FIORICET, ESGIC) 50-325-40 MG TABLET    Take 1-2 tablets by mouth every 6 (six) hours as needed for headache.     Renne Crigler, PA-C 12/07/16 1191    Marily Memos, MD 12/07/16 [phone]

## 2016-12-08 ENCOUNTER — Encounter (HOSPITAL_COMMUNITY): Payer: Self-pay | Admitting: Nurse Practitioner

## 2016-12-08 ENCOUNTER — Emergency Department (HOSPITAL_COMMUNITY): Payer: Medicaid Other

## 2016-12-08 ENCOUNTER — Emergency Department (HOSPITAL_COMMUNITY)
Admission: EM | Admit: 2016-12-08 | Discharge: 2016-12-08 | Disposition: A | Discharge status: Home / Self Care | Payer: Medicaid Other | Attending: Emergency Medicine | Admitting: Emergency Medicine

## 2016-12-08 DIAGNOSIS — R05 Cough: Secondary | ICD-10-CM | POA: Diagnosis present

## 2016-12-08 DIAGNOSIS — J45909 Unspecified asthma, uncomplicated: Secondary | ICD-10-CM | POA: Insufficient documentation

## 2016-12-08 DIAGNOSIS — F17210 Nicotine dependence, cigarettes, uncomplicated: Secondary | ICD-10-CM | POA: Insufficient documentation

## 2016-12-08 DIAGNOSIS — R51 Headache: Secondary | ICD-10-CM | POA: Insufficient documentation

## 2016-12-08 DIAGNOSIS — Z79899 Other long term (current) drug therapy: Secondary | ICD-10-CM | POA: Diagnosis not present

## 2016-12-08 DIAGNOSIS — I10 Essential (primary) hypertension: Secondary | ICD-10-CM | POA: Insufficient documentation

## 2016-12-08 DIAGNOSIS — J40 Bronchitis, not specified as acute or chronic: Secondary | ICD-10-CM

## 2016-12-08 LAB — CULTURE, BLOOD (ROUTINE X 2)
CULTURE: NO GROWTH
Culture: NO GROWTH
SPECIAL REQUESTS: ADEQUATE
Special Requests: ADEQUATE

## 2016-12-08 MED ORDER — AEROCHAMBER Z-STAT PLUS/MEDIUM MISC
1.0000 | Freq: Once | Status: AC
  Administered 2016-12-08: 1
  Filled 2016-12-08: qty 1

## 2016-12-08 MED ORDER — ACETAMINOPHEN 500 MG PO TABS
1000.0000 mg | ORAL_TABLET | Freq: Once | ORAL | Status: AC
  Administered 2016-12-08: 1000 mg via ORAL
  Filled 2016-12-08: qty 2

## 2016-12-08 NOTE — ED Notes (Signed)
Pt called for room, no response. 

## 2016-12-08 NOTE — Discharge Instructions (Signed)
Use your inhaler with spacer 2 puffs every 4 hours as needed for cough or shortness of breath. Use Tylenol every 4 hours as directed for pain.Call any of the numbers  listedto get help with your cocaine problem.. There  are also numbers on these instructions to get a primary care physician.call on Monday, 12/10/2016 to get primary care physician. Ask your new primary care physician  to help you to stop smoking

## 2016-12-08 NOTE — ED Triage Notes (Signed)
Pt is c/o a productive cough that has been ongoing.

## 2016-12-08 NOTE — ED Provider Notes (Signed)
[facility]-EMERGENCY DEPT Provider Note   CSN: [id number] Arrival date & time: 12/08/16  0133     History   Chief Complaint Chief Complaint  Patient presents with  . Cough    HPI Joseph Huff is a 36 y.o. male.  HPI  complains of cough productive of thick sputum onset 5 PM yesterday. Also complains of diffuse headache. Nothing makes symptoms better or worse. No vomiting. No other associated symptoms. No treatment prior to coming here. Admits to subjective fever, did not take temperature. Past Medical History:  Diagnosis Date  . Asthma   . Hypertension   . Panic attack ?2017 X 1  . Pneumonia ~ 2016    Patient Active Problem List   Diagnosis Date Noted  . Substance use disorder 12/05/2016  . Nonischemic cardiomyopathy (HCC) 11/28/2016  . Mild intermittent asthma with exacerbation   . Pleuritic chest pain 11/27/2016  . Shortness of breath   . Mild intermittent asthma with acute exacerbation   . Foreign body of pharynx 08/12/2016  . Dysphagia 08/12/2016    Past Surgical History:  Procedure Laterality Date  . DIRECT LARYNGOSCOPY N/A 08/12/2016   Procedure: LARYNGOSCOPY AND CERVICAL ESOPHOGOSCOPY;  Surgeon: Osborn Coho, MD;  Location: [facility] OR;  Service: ENT;  Laterality: N/A;       Home Medications    Prior to Admission medications   Medication Sig Start Date End Date Taking? Authorizing Provider  albuterol (PROVENTIL HFA;VENTOLIN HFA) 108 (90 Base) MCG/ACT inhaler Inhale 2 puffs into the lungs every 4 (four) hours as needed for wheezing or shortness of breath. 11/28/16   Strader, Lennart Pall, PA-C  aspirin 81 MG EC tablet Take 1 tablet (81 mg total) by mouth daily. 11/29/16   Strader, Lennart Pall, PA-C  butalbital-acetaminophen-caffeine Herrick, ESGIC) [phone] MG tablet Take 1-2 tablets by mouth every 6 (six) hours as needed for headache. 12/07/16 12/07/17  Renne Crigler, PA-C  Fluticasone-Salmeterol (ADVAIR DISKUS) 250-50 MCG/DOSE AEPB Inhale 1 puff into the lungs 2 (two)  times daily. 11/28/16 11/28/17  Ellsworth Lennox, PA-C    Family History Family History  Problem Relation Age of Onset  . Hypertension Maternal Uncle   . Diabetes Maternal Uncle     Social History Social History  Substance Use Topics  . Smoking status: Current Every Day Smoker    Packs/day: 0.10    Years: 11.00    Types: Cigarettes  . Smokeless tobacco: Never Used  . Alcohol use Yes     Comment: 11/27/2016 "I'll have a beer q couple months"    Last used cocaine 3 days ago. Snorts cocaine. No history of IV drug use. Last used alcohol 3 days ago. Allergies   Patient has no known allergies.   Review of Systems Review of Systems  Constitutional: Negative.   HENT: Negative.   Respiratory: Positive for cough.   Cardiovascular: Negative.   Gastrointestinal: Negative.   Musculoskeletal: Negative.   Skin: Negative.   Neurological: Negative.   Psychiatric/Behavioral: Negative.   All other systems reviewed and are negative.    Physical Exam Updated Vital Signs BP 113/62 (BP Location: Right Arm)   Pulse 81   Temp (!) 97.5 F (36.4 C) (Oral)   Resp 18   SpO2 99%   Physical Exam  Constitutional: He appears well-developed and well-nourished. No distress.  HENT:  Head: Normocephalic and atraumatic.  Right Ear: External ear normal.  Left Ear: External ear normal.  Nose: Nose normal.  Mouth/Throat: Oropharynx is clear and moist.  Eyes: Pupils  are equal, round, and reactive to light. Conjunctivae are normal.  Neck: Neck supple. No tracheal deviation present. No thyromegaly present.  Cardiovascular: Normal rate and regular rhythm.   No murmur heard. Pulmonary/Chest: Effort normal and breath sounds normal.  Coughing frequently  Abdominal: Soft. Bowel sounds are normal. He exhibits no distension. There is no tenderness.  Musculoskeletal: Normal range of motion. He exhibits no edema or tenderness.  Neurological: He is alert. Coordination normal.  Skin: Skin is warm and  dry. No rash noted.  Psychiatric: He has a normal mood and affect.  Nursing note and vitals reviewed.    ED Treatments / Results  Labs (all labs ordered are listed, but only abnormal results are displayed) Labs Reviewed - No data to display  EKG  EKG Interpretation None       Radiology Dg Chest 2 View  Result Date: 12/08/2016 CLINICAL DATA:  Cough and chest pain. EXAM: CHEST  2 VIEW COMPARISON:  Radiograph 12/02/2016, additional priors. Coronary chest CT 11/28/2016 FINDINGS: Again seen hyperinflation and bronchial thickening. The cardiomediastinal contours are normal. The lungs are clear. Pulmonary vasculature is normal. No consolidation, pleural effusion, or pneumothorax. No acute osseous abnormalities are seen. IMPRESSION: Chronic hyperinflation and bronchial thickening. No acute abnormality. Electronically Signed   By: Rubye Oaks M.D.   On: 12/08/2016 04:11    Procedures Procedures (including critical care time)  Medications Ordered in ED Medications  aerochamber plus with mask device 1 each (not administered)  acetaminophen (TYLENOL) tablet 1,000 mg (not administered)     Initial Impression / Assessment and Plan / ED Course  I have reviewed the triage vital signs and the nursing notes.  Pertinent labs & imaging results that were available during my care of the patient were reviewed by me and considered in my medical decision making (see chart for details).     Chest x-ray viewed by me Results for orders placed or performed during the hospital encounter of 12/02/16  Culture, blood (routine x 2)  Result Value Ref Range   Specimen Description BLOOD RIGHT ARM    Special Requests      BOTTLES DRAWN AEROBIC AND ANAEROBIC Blood Culture adequate volume   Culture NO GROWTH 4 DAYS    Report Status PENDING   Culture, blood (routine x 2)  Result Value Ref Range   Specimen Description BLOOD LEFT ARM    Special Requests      BOTTLES DRAWN AEROBIC ONLY Blood Culture  adequate volume   Culture NO GROWTH 4 DAYS    Report Status PENDING   CSF culture with Stat gram stain  Result Value Ref Range   Specimen Description CSF    Special Requests NONE    Gram Stain NO WBC SEEN NO ORGANISMS SEEN     Culture NO GROWTH 3 DAYS    Report Status 12/06/2016 FINAL   CBC  Result Value Ref Range   WBC 8.4 4.0 - 10.5 K/uL   RBC 4.79 4.22 - 5.81 MIL/uL   Hemoglobin 13.6 13.0 - 17.0 g/dL   HCT 16.1 09.6 - 04.5 %   MCV 83.5 78.0 - 100.0 fL   MCH 28.4 26.0 - 34.0 pg   MCHC 34.0 30.0 - 36.0 g/dL   RDW 40.9 81.1 - 91.4 %   Platelets 222 150 - 400 K/uL  Urine rapid drug screen (hosp performed)  Result Value Ref Range   Opiates NONE DETECTED NONE DETECTED   Cocaine POSITIVE (A) NONE DETECTED   Benzodiazepines NONE DETECTED  NONE DETECTED   Amphetamines NONE DETECTED NONE DETECTED   Tetrahydrocannabinol NONE DETECTED NONE DETECTED   Barbiturates NONE DETECTED NONE DETECTED  Comprehensive metabolic panel  Result Value Ref Range   Sodium 136 135 - 145 mmol/L   Potassium 3.6 3.5 - 5.1 mmol/L   Chloride 101 101 - 111 mmol/L   CO2 26 22 - 32 mmol/L   Glucose, Bld 100 (H) 65 - 99 mg/dL   BUN 20 6 - 20 mg/dL   Creatinine, Ser 9.60 0.61 - 1.24 mg/dL   Calcium 8.6 (L) 8.9 - 10.3 mg/dL   Total Protein 6.3 (L) 6.5 - 8.1 g/dL   Albumin 3.6 3.5 - 5.0 g/dL   AST 28 15 - 41 U/L   ALT 18 17 - 63 U/L   Alkaline Phosphatase 96 38 - 126 U/L   Total Bilirubin 0.6 0.3 - 1.2 mg/dL   GFR calc non Af Amer >60 >60 mL/min   GFR calc Af Amer >60 >60 mL/min   Anion gap 9 5 - 15  Ethanol  Result Value Ref Range   Alcohol, Ethyl (B) <5 <5 mg/dL  Brain natriuretic peptide  Result Value Ref Range   B Natriuretic Peptide 8.1 0.0 - 100.0 pg/mL  Acetaminophen level  Result Value Ref Range   Acetaminophen (Tylenol), Serum <10 (L) 10 - 30 ug/mL  Salicylate level  Result Value Ref Range   Salicylate Lvl <7.0 2.8 - 30.0 mg/dL  Urinalysis, Routine w reflex microscopic  Result Value Ref  Range   Color, Urine YELLOW YELLOW   APPearance CLEAR CLEAR   Specific Gravity, Urine 1.030 1.005 - 1.030   pH 5.0 5.0 - 8.0   Glucose, UA NEGATIVE NEGATIVE mg/dL   Hgb urine dipstick NEGATIVE NEGATIVE   Bilirubin Urine NEGATIVE NEGATIVE   Ketones, ur 5 (A) NEGATIVE mg/dL   Protein, ur NEGATIVE NEGATIVE mg/dL   Nitrite NEGATIVE NEGATIVE   Leukocytes, UA NEGATIVE NEGATIVE  D-dimer, quantitative (not at [facility])  Result Value Ref Range   D-Dimer, Quant 0.45 0.00 - 0.50 ug/mL-FEU  Troponin I  Result Value Ref Range   Troponin I <0.03 <0.03 ng/mL  Basic metabolic panel  Result Value Ref Range   Sodium 139 135 - 145 mmol/L   Potassium 3.8 3.5 - 5.1 mmol/L   Chloride 104 101 - 111 mmol/L   CO2 29 22 - 32 mmol/L   Glucose, Bld 93 65 - 99 mg/dL   BUN 10 6 - 20 mg/dL   Creatinine, Ser 4.54 0.61 - 1.24 mg/dL   Calcium 8.3 (L) 8.9 - 10.3 mg/dL   GFR calc non Af Amer >60 >60 mL/min   GFR calc Af Amer >60 >60 mL/min   Anion gap 6 5 - 15  CBC  Result Value Ref Range   WBC 9.9 4.0 - 10.5 K/uL   RBC 4.69 4.22 - 5.81 MIL/uL   Hemoglobin 13.0 13.0 - 17.0 g/dL   HCT 09.8 11.9 - 14.7 %   MCV 85.9 78.0 - 100.0 fL   MCH 27.7 26.0 - 34.0 pg   MCHC 32.3 30.0 - 36.0 g/dL   RDW 82.9 56.2 - 13.0 %   Platelets 197 150 - 400 K/uL  Hepatitis c antibody (reflex)  Result Value Ref Range   HCV Ab 0.1 0.0 - 0.9 s/co ratio  Hepatitis B core antibody, total  Result Value Ref Range   Hep B Core Total Ab Negative Negative  Hepatitis B surface antibody  Result Value Ref  Range   Hep B S Ab Non Reactive   Hepatitis B surface antigen  Result Value Ref Range   Hepatitis B Surface Ag Negative Negative  RPR  Result Value Ref Range   RPR Ser Ql Non Reactive Non Reactive  Protein and glucose, CSF  Result Value Ref Range   Glucose, CSF 71 (H) 40 - 70 mg/dL   Total  Protein, CSF 19 15 - 45 mg/dL  CSF cell count with differential  Result Value Ref Range   Tube # 3    Color, CSF COLORLESS COLORLESS    Appearance, CSF CLEAR CLEAR   Supernatant NOT INDICATED    RBC Count, CSF 1 (H) 0 /cu mm   WBC, CSF 2 0 - 5 /cu mm   Other Cells, CSF TOO FEW TO COUNT, SMEAR AVAILABLE FOR REVIEW   CBC  Result Value Ref Range   WBC 12.4 (H) 4.0 - 10.5 K/uL   RBC 4.91 4.22 - 5.81 MIL/uL   Hemoglobin 13.6 13.0 - 17.0 g/dL   HCT 09.8 11.9 - 14.7 %   MCV 85.3 78.0 - 100.0 fL   MCH 27.7 26.0 - 34.0 pg   MCHC 32.5 30.0 - 36.0 g/dL   RDW 82.9 56.2 - 13.0 %   Platelets 198 150 - 400 K/uL  Basic metabolic panel  Result Value Ref Range   Sodium 139 135 - 145 mmol/L   Potassium 4.5 3.5 - 5.1 mmol/L   Chloride 108 101 - 111 mmol/L   CO2 25 22 - 32 mmol/L   Glucose, Bld 96 65 - 99 mg/dL   BUN 13 6 - 20 mg/dL   Creatinine, Ser 8.65 0.61 - 1.24 mg/dL   Calcium 8.4 (L) 8.9 - 10.3 mg/dL   GFR calc non Af Amer >60 >60 mL/min   GFR calc Af Amer >60 >60 mL/min   Anion gap 6 5 - 15  HCV Comment:  Result Value Ref Range   Comment: Comment   TSH  Result Value Ref Range   TSH 0.892 0.350 - 4.500 uIU/mL  Glucose, capillary  Result Value Ref Range   Glucose-Capillary 128 (H) 65 - 99 mg/dL  Basic metabolic panel  Result Value Ref Range   Sodium 140 135 - 145 mmol/L   Potassium 4.2 3.5 - 5.1 mmol/L   Chloride 109 101 - 111 mmol/L   CO2 26 22 - 32 mmol/L   Glucose, Bld 89 65 - 99 mg/dL   BUN 9 6 - 20 mg/dL   Creatinine, Ser 7.84 0.61 - 1.24 mg/dL   Calcium 8.4 (L) 8.9 - 10.3 mg/dL   GFR calc non Af Amer >60 >60 mL/min   GFR calc Af Amer >60 >60 mL/min   Anion gap 5 5 - 15  CBC  Result Value Ref Range   WBC 10.1 4.0 - 10.5 K/uL   RBC 4.75 4.22 - 5.81 MIL/uL   Hemoglobin 12.7 (L) 13.0 - 17.0 g/dL   HCT 69.6 29.5 - 28.4 %   MCV 85.9 78.0 - 100.0 fL   MCH 26.7 26.0 - 34.0 pg   MCHC 31.1 30.0 - 36.0 g/dL   RDW 13.2 44.0 - 10.2 %   Platelets 201 150 - 400 K/uL  I-stat troponin, ED  Result Value Ref Range   Troponin i, poc 0.00 0.00 - 0.08 ng/mL   Comment 3          I-Stat arterial blood gas, ED    Result Value Ref Range  pH, Arterial 7.404 7.350 - 7.450   pCO2 arterial 43.5 32.0 - 48.0 mmHg   pO2, Arterial 87.0 83.0 - 108.0 mmHg   Bicarbonate 27.3 20.0 - 28.0 mmol/L   TCO2 29 0 - 100 mmol/L   O2 Saturation 97.0 %   Acid-Base Excess 2.0 0.0 - 2.0 mmol/L   Patient temperature 98.0 F    Collection site RADIAL, ALLEN'S TEST ACCEPTABLE    Drawn by RT    Sample type ARTERIAL   I-stat troponin, ED  Result Value Ref Range   Troponin i, poc 0.00 0.00 - 0.08 ng/mL   Comment 3           Dg Chest 2 View  Result Date: 12/08/2016 CLINICAL DATA:  Cough and chest pain. EXAM: CHEST  2 VIEW COMPARISON:  Radiograph 12/02/2016, additional priors. Coronary chest CT 11/28/2016 FINDINGS: Again seen hyperinflation and bronchial thickening. The cardiomediastinal contours are normal. The lungs are clear. Pulmonary vasculature is normal. No consolidation, pleural effusion, or pneumothorax. No acute osseous abnormalities are seen. IMPRESSION: Chronic hyperinflation and bronchial thickening. No acute abnormality. Electronically Signed   By: Rubye Oaks M.D.   On: 12/08/2016 04:11   Dg Chest 2 View  Result Date: 11/27/2016 CLINICAL DATA:  Acute onset of cough, congestion and shortness of breath. Generalized chest pain. Initial encounter. EXAM: CHEST  2 VIEW COMPARISON:  Chest radiograph performed 11/10/2016 FINDINGS: The lungs are well-aerated and clear. There is no evidence of focal opacification, pleural effusion or pneumothorax. The heart is normal in size; the mediastinal contour is within normal limits. No acute osseous abnormalities are seen. A metallic piercing is noted overlying the left nipple shadow. IMPRESSION: No acute cardiopulmonary process seen. Electronically Signed   By: Roanna Raider M.D.   On: 11/27/2016 05:26   Ct Head Wo Contrast  Result Date: 12/06/2016 CLINICAL DATA:  Headache. Onset after hospital discharge today for shortness of breath. EXAM: CT HEAD WITHOUT CONTRAST TECHNIQUE:  Contiguous axial images were obtained from the base of the skull through the vertex without intravenous contrast. COMPARISON:  None. FINDINGS: Brain: No intracranial hemorrhage, mass effect, or midline shift. No hydrocephalus. The basilar cisterns are patent. No evidence of territorial infarct or acute ischemia. No extra-axial or intracranial fluid collection. Vascular: No hyperdense vessel or unexpected calcification. Skull:  No skull fracture or focal lesion. Sinuses/Orbits: Mucosal thickening of the ethmoid air cells. Mucous retention cysts in right and left maxillary sinus, left maxillary sinus mucosal thickening and small fluid level. Mastoid air cells are well-aerated. Visualized orbits are normal. Other: None. IMPRESSION: 1. Normal intracranial. 2. Paranasal sinus inflammation, possibly acute. Electronically Signed   By: Rubye Oaks M.D.   On: 12/06/2016 00:18   Ct Cardiac Morph/pulm Vein W/cm&w/o Ca Score  Addendum Date: 11/28/2016   ADDENDUM REPORT: 11/28/2016 14:14 CLINICAL DATA:  Chest pain EXAM: Cardiac CTA MEDICATIONS: Sub lingual nitro. 4mg  and lopressor 5mg  TECHNIQUE: The patient was scanned on a Siemens 192 slice scanner. Gantry rotation speed was 250 msecs. Collimation was .6 mm. A 100 kV prospective scan was triggered in the ascending thoracic aorta at 140 HU's at 65-75% of the R-R interval. Average HR during the scan was 65 bpm. The 3D data set was interpreted on a dedicated work station using MPR, MIP and VRT modes. A total of 80cc of contrast was used. FINDINGS: Non-cardiac: See separate report from [facility]. Calcium Score:  0 Agatston units. Coronary Arteries: Right dominant with no anomalies LM:  No plaque or stenosis.  LAD system:  Large caliber LAD.  No plaque or stenosis. Circumflex system: Large caliber AV LCx with large OM1. No plaque or stenosis. RCA:  No plaque or stenosis. IMPRESSION: 1. Coronary artery clalcium score 0 Agatston units, suggesting low risk for future  cardiac events. 2. No plaque or stenosis in the coronary arteries. The LAD and LCx were large caliber vessels. Dalton Mclean Electronically Signed   By: Marca Ancona M.D.   On: 11/28/2016 14:14   Result Date: 11/28/2016 EXAM: OVER-READ INTERPRETATION  CT CHEST The following report is an over-read performed by radiologist Dr. Maryelizabeth Rowan [facility] on 11/28/2016. This over-read does not include interpretation of cardiac or coronary anatomy or pathology. The coronary CTA interpretation by the cardiologist is attached. COMPARISON:  None. FINDINGS: Limited view of the lung parenchyma demonstrates no suspicious nodularity. Airways are normal. Limited view of the mediastinum demonstrates no adenopathy. Esophagus normal. Limited view of the upper abdomen unremarkable. Limited view of the skeleton and chest wall is unremarkable. IMPRESSION: No significant extracardiac findings. Electronically Signed: By: Genevive Bi M.D. On: 11/28/2016 13:09   Dg Chest Portable 1 View  Result Date: 12/02/2016 CLINICAL DATA:  Chest pain and shortness of breath. History of asthma and pneumonia. EXAM: PORTABLE CHEST 1 VIEW COMPARISON:  Chest radiograph November 27, 2016 FINDINGS: Cardiomediastinal silhouette is normal. No pleural effusions or focal consolidations. Hyperinflation. Mild bronchitic changes. Trachea projects midline and there is no pneumothorax. Soft tissue planes and included osseous structures are non-suspicious. IMPRESSION: Mild bronchitic changes and hyperinflation. Electronically Signed   By: Awilda Metro M.D.   On: 12/02/2016 03:51   Dg Chest Portable 1 View  Result Date: 11/10/2016 CLINICAL DATA:  Shortness of breath all day. History of asthma. Wheezing. EXAM: PORTABLE CHEST 1 VIEW COMPARISON:  09/16/2016 FINDINGS: Mild pulmonary hyperinflation. Normal heart size and pulmonary vascularity. No focal airspace disease or consolidation in the lungs. No blunting of costophrenic angles. No  pneumothorax. Mediastinal contours appear intact. IMPRESSION: Mild hyperinflation.  No evidence of active pulmonary disease. Electronically Signed   By: Burman Nieves M.D.   On: 11/10/2016 02:57  counseled patient 5 minutes on smoking cessation and also advised him to stop cocaine. Plan referral substance abuse. Referral primary care. He is to get an AeroChamber for his pro-air inhaler use 2 puffs every 4 hours when necessary cough or shortness of breath. Tylenol for pain. Final Clinical Impressions(s) / ED Diagnoses  Diagnoses #1 acute bronchitis #2 tobacco abuse #3 cocaine abuse Final diagnoses:  None    New Prescriptions New Prescriptions   No medications on file     Doug Sou, MD 12/08/16 [phone]

## 2016-12-09 ENCOUNTER — Emergency Department (HOSPITAL_COMMUNITY)
Admission: EM | Admit: 2016-12-09 | Discharge: 2016-12-09 | Disposition: A | Discharge status: Home / Self Care | Payer: Medicaid Other | Attending: Emergency Medicine | Admitting: Emergency Medicine

## 2016-12-09 ENCOUNTER — Encounter (HOSPITAL_COMMUNITY): Payer: Self-pay | Admitting: *Deleted

## 2016-12-09 DIAGNOSIS — J45909 Unspecified asthma, uncomplicated: Secondary | ICD-10-CM | POA: Diagnosis not present

## 2016-12-09 DIAGNOSIS — J019 Acute sinusitis, unspecified: Secondary | ICD-10-CM | POA: Diagnosis not present

## 2016-12-09 DIAGNOSIS — R519 Headache, unspecified: Secondary | ICD-10-CM

## 2016-12-09 DIAGNOSIS — R51 Headache: Secondary | ICD-10-CM | POA: Diagnosis present

## 2016-12-09 DIAGNOSIS — F17210 Nicotine dependence, cigarettes, uncomplicated: Secondary | ICD-10-CM | POA: Diagnosis not present

## 2016-12-09 MED ORDER — IBUPROFEN 400 MG PO TABS
600.0000 mg | ORAL_TABLET | Freq: Once | ORAL | Status: AC
  Administered 2016-12-09: 600 mg via ORAL
  Filled 2016-12-09: qty 1

## 2016-12-09 MED ORDER — AZITHROMYCIN 250 MG PO TABS: 250.0000 mg | ORAL_TABLET | Freq: Every day | ORAL | 0 refills | Status: AC

## 2016-12-09 NOTE — ED Notes (Signed)
Pt understood dc material. NAD Noted. SCritps given at [employer] [employer]

## 2016-12-09 NOTE — ED Triage Notes (Signed)
The pt has had a headache for 3-4 days with nausea and vomiting.  He has been here everyday for the same since the 19th of august

## 2016-12-09 NOTE — ED Provider Notes (Signed)
[facility]PT Provider Note   CSN: [id number] Arrival date & time: 12/09/16  2021     History   Chief Complaint Chief Complaint  Patient presents with  . Headache    HPI Joseph Huff is a 36 y.o. 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 He reports that his headache has been present for about the past week and began after he received a lumbar puncture. He denies any fevers or chills, no N/V/D.  He was seen here on 8/22 and 8/24 for the same.  He reports that he has been taking the medications that he was discharged with but that his headache is not better.  He was seen here yesterday for cough and diagnosed with bronchitis.  He reports that he is unable to afford any medications until he gets paid and is requesting something for his pain.  His head hurts more when he bends over, does not change with simply sitting up vs laying down.   HPI  Past Medical History:  Diagnosis Date  . Asthma   . Hypertension   . Panic attack ?2017 X 1  . Pneumonia ~ 2016    Patient Active Problem List   Diagnosis Date Noted  . Substance use disorder 12/05/2016  . Nonischemic cardiomyopathy (HCC) 11/28/2016  . Mild intermittent asthma with exacerbation   . Pleuritic chest pain 11/27/2016  . Shortness of breath   . Mild intermittent asthma with acute exacerbation   . Foreign body of pharynx 08/12/2016  . Dysphagia 08/12/2016    Past Surgical History:  Procedure Laterality Date  . DIRECT LARYNGOSCOPY N/A 08/12/2016   Procedure: LARYNGOSCOPY AND CERVICAL ESOPHOGOSCOPY;  Surgeon: Osborn Coho, MD;  Location: [facility] OR;  Service: ENT;  Laterality: N/A;       Home Medications    Prior to Admission medications   Medication Sig Start Date End Date Taking? Authorizing Provider  albuterol (PROVENTIL HFA;VENTOLIN HFA) 108 (90 Base) MCG/ACT inhaler Inhale 2 puffs into the lungs  every 4 (four) hours as needed for wheezing or shortness of breath. 11/28/16   Strader, Lennart Pall, PA-C  aspirin 81 MG EC tablet Take 1 tablet (81 mg total) by mouth daily. 11/29/16   Strader, Lennart Pall, PA-C  azithromycin (ZITHROMAX) 250 MG tablet Take 1 tablet (250 mg total) by mouth daily. Take first 2 tablets together, then 1 every day until finished. 12/09/16   Cristina Gong, PA-C  butalbital-acetaminophen-caffeine [place], ESGIC) [phone] MG tablet Take 1-2 tablets by mouth every 6 (six) hours as needed for headache. 12/07/16 12/07/17  Renne Crigler, PA-C  Fluticasone-Salmeterol (ADVAIR DISKUS) 250-50 MCG/DOSE AEPB Inhale 1 puff into the lungs 2 (two) times daily. 11/28/16 11/28/17  Ellsworth Lennox, PA-C    Family History Family History  Problem Relation Age of Onset  . Hypertension Maternal Uncle   . Diabetes Maternal Uncle     Social History Social History  Substance Use Topics  . Smoking status: Current Every Day Smoker    Packs/day: 0.10    Years: 11.00    Types: Cigarettes  . Smokeless tobacco: Never Used  . Alcohol use Yes     Comment: 11/27/2016 "I'll have a beer q couple months"     Allergies   Patient has no known allergies.   Review of Systems Review of Systems  Constitutional: Negative for chills and fever.  HENT: Positive for sinus pain and sinus pressure. Negative  for facial swelling, sore throat, trouble swallowing and voice change.   Eyes: Positive for photophobia. Negative for pain and visual disturbance.  Respiratory: Positive for cough. Negative for chest tightness, shortness of breath and wheezing.   Cardiovascular: Negative for chest pain and palpitations.  Gastrointestinal: Positive for nausea. Negative for abdominal pain, constipation, diarrhea and vomiting.  Musculoskeletal: Negative for neck pain.  Skin: Negative for rash.  Neurological: Positive for headaches. Negative for seizures and weakness.     Physical Exam Updated Vital  Signs BP 115/79   Pulse (!) 55   Temp 98.2 F (36.8 C) (Oral)   Resp 16   Ht 6' (1.829 m)   Wt 84.8 kg (187 lb)   SpO2 95%   BMI 25.36 kg/m   Physical Exam  Constitutional: He appears well-developed and well-nourished.  HENT:  Head: Normocephalic and atraumatic.  Right Ear: Tympanic membrane, external ear and ear canal normal.  Left Ear: Tympanic membrane, external ear and ear canal normal.  Nose: Mucosal edema present. Right sinus exhibits no maxillary sinus tenderness and no frontal sinus tenderness. Left sinus exhibits maxillary sinus tenderness. Left sinus exhibits no frontal sinus tenderness.  Eyes: Conjunctivae are normal.  Neck: Neck supple.  Cardiovascular: Normal rate and regular rhythm.   No murmur heard. Pulmonary/Chest: Effort normal and breath sounds normal. No respiratory distress. He has no decreased breath sounds. He has no wheezes. He has no rhonchi.  Abdominal: Soft. There is no tenderness.  Musculoskeletal: He exhibits no edema.  Neurological: He is alert.  Mental Status:  Alert, oriented, thought content appropriate, able to give a coherent history. Speech fluent without evidence of aphasia. Able to follow 2 step commands without difficulty.  Cranial Nerves:  II:  Peripheral visual fields grossly normal, pupils equal, round, reactive to light III,IV, VI: extra-ocular motions intact bilaterally  V,VII: smile symmetric, facial light touch sensation equal VIII: hearing grossly normal to voice  X: uvula elevates symmetrically  XI: bilateral shoulder shrug symmetric and strong XII: midline tongue extension without fassiculations Motor:  Normal tone. 5/5 in upper and lower extremities bilaterally including strong and equal grip strength and dorsiflexion/plantar flexion CV: distal pulses palpable throughout    Skin: Skin is warm and dry.  Psychiatric: He has a normal mood and affect.  Nursing note and vitals reviewed.    ED Treatments / Results  Labs (all  labs ordered are listed, but only abnormal results are displayed) Labs Reviewed - No data to display  EKG  EKG Interpretation None       Radiology- obtained yesterday Dg Chest 2 View  Result Date: 12/08/2016 CLINICAL DATA:  Cough and chest pain. EXAM: CHEST  2 VIEW COMPARISON:  Radiograph 12/02/2016, additional priors. Coronary chest CT 11/28/2016 FINDINGS: Again seen hyperinflation and bronchial thickening. The cardiomediastinal contours are normal. The lungs are clear. Pulmonary vasculature is normal. No consolidation, pleural effusion, or pneumothorax. No acute osseous abnormalities are seen. IMPRESSION: Chronic hyperinflation and bronchial thickening. No acute abnormality. Electronically Signed   By: Rubye Oaks M.D.   On: 12/08/2016 04:11    Procedures Procedures (including critical care time)  Medications Ordered in ED Medications - No data to display   Initial Impression / Assessment and Plan / ED Course  I have reviewed the triage vital signs and the nursing notes.  Pertinent labs & imaging results that were available during my care of the patient were reviewed by me and considered in my medical decision making (see chart for details).  Patient complaining of symptoms of sinusitis.    Severe symptoms have been present for greater than 7 days with purulent nasal discharge and maxillary sinus pain.  Concern for acute bacterial rhinosinusitis.  Patient has increased pain when bending over, pain does not change with sitting up/laying down as I would expect it to if this was a post -LP headache.   His headache was treated with ibuprofen in the ED.  He had a CT head obtained on 12/06/16 when he was having this headache which did not show any cranial abnormalities, findings consistent with sinusitis.  He is afebrile, not tachycardic and is with out signs of systemic infection.  Patient discharged with azithromycin.  Instructions given for warm saline nasal wash and  recommendations for follow-up with primary care physician.    Patient was seen as a shared visit with Dr.Yelverton who evaluated the patient and agrees with my plan.  Final Clinical Impressions(s) / ED Diagnoses   Final diagnoses:  Acute sinusitis, recurrence not specified, unspecified location  Sinus headache    New Prescriptions New Prescriptions   AZITHROMYCIN (ZITHROMAX) 250 MG TABLET    Take 1 tablet (250 mg total) by mouth daily. Take first 2 tablets together, then 1 every day until finished.     Cristina Gong, PA-C 12/09/16 [id number]    Loren Racer, MD 12/12/16 [phone]

## 2016-12-09 NOTE — Discharge Instructions (Signed)
Please take Ibuprofen (Advil, motrin) and Tylenol (acetaminophen) to relieve your pain.  You may take up to 600 MG (3 pills) of normal strength ibuprofen every 8 hours as needed.  In between doses of ibuprofen you make take tylenol, up to 1,000 mg (two extra strength pills).  Do not take more than 3,000 mg tylenol in a 24 hour period.  Please check all medication labels as many medications such as pain and cold medications may contain tylenol.  Do not drink alcohol while taking these medications.  Do not take other NSAID'S while taking ibuprofen (such as aleve or naproxen).  Please take ibuprofen with food to decrease stomach upset.  You may have diarrhea from the antibiotics.  It is very important that you continue to take the antibiotics even if you get diarrhea unless a medical professional tells you that you may stop taking them.  If you stop too early the bacteria you are being treated for will become stronger and you may need different, more powerful antibiotics that have more side effects and worsening diarrhea.  Please stay well hydrated and consider probiotics as they may decrease the severity of your diarrhea.     You may take afrin as needed for nasal congestion.  Do not use afrin for more than three days.

## 2016-12-10 ENCOUNTER — Emergency Department (HOSPITAL_COMMUNITY)
Admission: EM | Admit: 2016-12-10 | Discharge: 2016-12-10 | Disposition: A | Discharge status: Home / Self Care | Payer: Medicaid Other | Attending: Emergency Medicine | Admitting: Emergency Medicine

## 2016-12-10 ENCOUNTER — Encounter (HOSPITAL_COMMUNITY): Payer: Self-pay | Admitting: Emergency Medicine

## 2016-12-10 DIAGNOSIS — J45909 Unspecified asthma, uncomplicated: Secondary | ICD-10-CM | POA: Diagnosis not present

## 2016-12-10 DIAGNOSIS — R51 Headache: Secondary | ICD-10-CM | POA: Diagnosis not present

## 2016-12-10 DIAGNOSIS — F17210 Nicotine dependence, cigarettes, uncomplicated: Secondary | ICD-10-CM | POA: Diagnosis not present

## 2016-12-10 DIAGNOSIS — I10 Essential (primary) hypertension: Secondary | ICD-10-CM | POA: Insufficient documentation

## 2016-12-10 DIAGNOSIS — R519 Headache, unspecified: Secondary | ICD-10-CM

## 2016-12-10 MED ORDER — IBUPROFEN 400 MG PO TABS
400.0000 mg | ORAL_TABLET | Freq: Once | ORAL | Status: AC
  Administered 2016-12-10: 400 mg via ORAL
  Filled 2016-12-10: qty 1

## 2016-12-10 MED ORDER — ACETAMINOPHEN 325 MG PO TABS
650.0000 mg | ORAL_TABLET | Freq: Once | ORAL | Status: AC
  Administered 2016-12-10: 650 mg via ORAL
  Filled 2016-12-10: qty 2

## 2016-12-10 NOTE — ED Triage Notes (Signed)
Pt returning to ER due to persistent migraine. Was seen last night for same and discharged but states "because yall didn't have any bus passes I had to stay and now it's come back." pt in NAD at present. VSS.

## 2016-12-10 NOTE — ED Provider Notes (Signed)
[facility] Provider Note   CSN: [id number] Arrival date & time: 12/10/16  0710     History   Chief Complaint Chief Complaint  Patient presents with  . Migraine    HPI Joseph Huff is a 36 y.o. male.  Patient c/o intermittent frontal headache. States was seen last evening for same, but had no bus pass, and after staying in waiting area, headache gradually returned. Headache is intermittent, not constant. Dull, mild-moderate.  Headache is not positional, no change whether upright or supine. No acute/abrupt or thunderclap type of pain. No neck pain or stiffness. No eye pain or change in vision. No syncope. No head injury or trauma. No purulent nasal drainage or congestion. No sore throat. No fever or chills.   The history is provided by the patient.  Migraine  Associated symptoms include headaches. Pertinent negatives include no chest pain, no abdominal pain and no shortness of breath.    Past Medical History:  Diagnosis Date  . Asthma   . Hypertension   . Panic attack ?2017 X 1  . Pneumonia ~ 2016    Patient Active Problem List   Diagnosis Date Noted  . Substance use disorder 12/05/2016  . Nonischemic cardiomyopathy (HCC) 11/28/2016  . Mild intermittent asthma with exacerbation   . Pleuritic chest pain 11/27/2016  . Shortness of breath   . Mild intermittent asthma with acute exacerbation   . Foreign body of pharynx 08/12/2016  . Dysphagia 08/12/2016    Past Surgical History:  Procedure Laterality Date  . DIRECT LARYNGOSCOPY N/A 08/12/2016   Procedure: LARYNGOSCOPY AND CERVICAL ESOPHOGOSCOPY;  Surgeon: Osborn Coho, MD;  Location: [facility] OR;  Service: ENT;  Laterality: N/A;       Home Medications    Prior to Admission medications   Medication Sig Start Date End Date Taking? Authorizing Provider  albuterol (PROVENTIL HFA;VENTOLIN HFA) 108 (90 Base) MCG/ACT inhaler Inhale 2 puffs into the lungs every 4 (four) hours as needed for wheezing or shortness of  breath. 11/28/16   Strader, Lennart Pall, PA-C  aspirin 81 MG EC tablet Take 1 tablet (81 mg total) by mouth daily. 11/29/16   Strader, Lennart Pall, PA-C  azithromycin (ZITHROMAX) 250 MG tablet Take 1 tablet (250 mg total) by mouth daily. Take first 2 tablets together, then 1 every day until finished. 12/09/16   Cristina Gong, PA-C  butalbital-acetaminophen-caffeine [place], ESGIC) [phone] MG tablet Take 1-2 tablets by mouth every 6 (six) hours as needed for headache. 12/07/16 12/07/17  Renne Crigler, PA-C  Fluticasone-Salmeterol (ADVAIR DISKUS) 250-50 MCG/DOSE AEPB Inhale 1 puff into the lungs 2 (two) times daily. 11/28/16 11/28/17  Ellsworth Lennox, PA-C    Family History Family History  Problem Relation Age of Onset  . Hypertension Maternal Uncle   . Diabetes Maternal Uncle     Social History Social History  Substance Use Topics  . Smoking status: Current Every Day Smoker    Packs/day: 0.10    Years: 11.00    Types: Cigarettes  . Smokeless tobacco: Never Used  . Alcohol use Yes     Comment: 11/27/2016 "I'll have a beer q couple months"     Allergies   Patient has no known allergies.   Review of Systems Review of Systems  Constitutional: Negative for fever.  HENT: Negative for sore throat.   Eyes: Negative for pain and visual disturbance.  Respiratory: Negative for shortness of breath.   Cardiovascular: Negative for chest pain.  Gastrointestinal: Negative for abdominal pain.  Genitourinary: Negative for flank pain.  Musculoskeletal: Negative for neck pain and neck stiffness.  Skin: Negative for rash.  Neurological: Positive for headaches. Negative for syncope, speech difficulty, weakness and numbness.  Hematological: Does not bruise/bleed easily.  Psychiatric/Behavioral: Negative for confusion.     Physical Exam Updated Vital Signs BP 113/74 (BP Location: Right Arm)   Pulse (!) 54   Temp 97.8 F (36.6 C) (Oral)   Resp 16   SpO2 100%   Physical Exam    Constitutional: He is oriented to person, place, and time. He appears well-developed and well-nourished. No distress.  HENT:  Head: Atraumatic.  Nose: Nose normal.  Mouth/Throat: Oropharynx is clear and moist.  No sinus or temporal tenderness.    Eyes: Pupils are equal, round, and reactive to light. Conjunctivae and EOM are normal.  Neck: Neck supple. No tracheal deviation present. No thyromegaly present.  Cardiovascular: Normal rate, regular rhythm, normal heart sounds and intact distal pulses.   No murmur heard. Pulmonary/Chest: Effort normal and breath sounds normal. No accessory muscle usage. No respiratory distress.  Abdominal: Soft. He exhibits no distension. There is no tenderness.  Musculoskeletal: He exhibits no edema or tenderness.  Neurological: He is alert and oriented to person, place, and time. No cranial nerve deficit.  Speech clear/fluent. Motor intact bil, stre 5/5. Steady gait.  Skin: Skin is warm and dry. No rash noted. He is not diaphoretic.  Psychiatric: He has a normal mood and affect.  Nursing note and vitals reviewed.    ED Treatments / Results  Labs (all labs ordered are listed, but only abnormal results are displayed) Labs Reviewed - No data to display  EKG  EKG Interpretation None       Radiology No results found.  Procedures Procedures (including critical care time)  Medications Ordered in ED Medications  acetaminophen (TYLENOL) tablet 650 mg (not administered)  ibuprofen (ADVIL,MOTRIN) tablet 400 mg (not administered)     Initial Impression / Assessment and Plan / ED Course  I have reviewed the triage vital signs and the nursing notes.  Pertinent labs & imaging results that were available during my care of the patient were reviewed by me and considered in my medical decision making (see chart for details).  Acetaminophen and ibuprofen po.  Po fluids.  Headache not positional, and intermittent - does not appear c/w post LP headache.    Pt appears comfortable and stable for d/c.      Final Clinical Impressions(s) / ED Diagnoses   Final diagnoses:  None    New Prescriptions New Prescriptions   No medications on file     Cathren Laine, MD 12/10/16 [phone]

## 2016-12-10 NOTE — Discharge Instructions (Signed)
It was our pleasure to provide your ER care today - we hope that you feel better.  Rest. Drink plenty of fluids  Try excedrin migraine as need for symptom relief.  Follow up with neurology in the next couple weeks if frequent/recurrent headaches - see referral - call office for appointment.  Return to ER if worse, acute or severe headache, persistent vomiting, other concern.

## 2016-12-14 ENCOUNTER — Ambulatory Visit: Payer: Medicaid Other | Admitting: Student

## 2017-11-21 ENCOUNTER — Ambulatory Visit: Admit: 2017-11-21 | Discharge: 2017-11-21 | Attending: Adult Health

## 2017-11-21 DIAGNOSIS — H1089 Other conjunctivitis: Secondary | ICD-10-CM

## 2017-11-21 LAB — POCT RAPID STREP A: Strep A Ag: NOT DETECTED

## 2017-11-21 MED ORDER — METHYLPREDNISOLONE 4 MG PO TBPK
4 MG | PACK | ORAL | 0 refills | Status: AC
Start: 2017-11-21 — End: 2017-11-27

## 2017-11-21 MED ORDER — ACETAMINOPHEN 500 MG PO TABS
500 MG | ORAL_TABLET | Freq: Four times a day (QID) | ORAL | 0 refills | Status: DC | PRN
Start: 2017-11-21 — End: 2018-02-26

## 2017-11-21 MED ORDER — ADVAIR DISKUS 100-50 MCG/DOSE IN AEPB
100-50 MCG/DOSE | Freq: Two times a day (BID) | RESPIRATORY_TRACT | 3 refills | Status: DC
Start: 2017-11-21 — End: 2018-02-21

## 2017-11-21 MED ORDER — ALBUTEROL SULFATE HFA 108 (90 BASE) MCG/ACT IN AERS
108 (90 Base) MCG/ACT | Freq: Four times a day (QID) | RESPIRATORY_TRACT | 3 refills | Status: DC | PRN
Start: 2017-11-21 — End: 2018-02-21

## 2017-11-21 MED ORDER — MOXIFLOXACIN HCL 0.5 % OP SOLN
0.5 % | Freq: Three times a day (TID) | OPHTHALMIC | 0 refills | Status: AC
Start: 2017-11-21 — End: 2017-11-28

## 2017-11-21 NOTE — Progress Notes (Signed)
Chief Complaint   Patient presents with   ??? Pharyngitis     This morning    ??? Conjunctivitis     2xdays ago... redness, lots of discharge, no pain/discomfort        HPI:  Patient presents today for complaints of bilateral red eyes and sore throat. He tells me that his eyes started getting irritated when he was standing next to a grill.  He did have sunglasses on.  He tells me that this morning there was a lot of discharge. Denies pain, denies itching. Denies vision changes.  He did go to [employer] and bought visine - but it has not improved the redness. He also woke up this morning with a sore throat. Does cause him painful swallowing. He has not tried any medication.     He is new to the area from [place]. He does have asthma - has been out of his albuterol and advair for about 1-2 months.     Prior to Visit Medications    Medication Sig Taking? Authorizing Provider   albuterol (ACCUNEB) 0.63 MG/3ML nebulizer solution Take 1 ampule by nebulization every 6 hours as needed for Wheezing Yes Historical Provider, MD   ibuprofen (ADVIL;MOTRIN) 100 MG/5ML suspension Take by mouth every 4 hours as needed for Fever Yes Historical Provider, MD         No Known Allergies      Review of Systems  Review of Systems   Constitutional: Negative for chills and fever.   HENT: Positive for sore throat. Negative for congestion, ear pain and nosebleeds.    Eyes: Positive for discharge and redness. Negative for photophobia, pain, itching and visual disturbance.   Respiratory: Positive for cough and chest tightness (due to asthma). Negative for shortness of breath and wheezing.    Cardiovascular: Negative for chest pain, palpitations and leg swelling.   Gastrointestinal: Negative for constipation, diarrhea, nausea and vomiting.   Genitourinary: Negative for dysuria and urgency.   Musculoskeletal: Negative for neck pain.   Neurological: Negative for headaches.         VS:  BP 110/80 (Site: Left Upper Arm, Position: Sitting)     Pulse 93    Temp 98.8 ??F (37.1 ??C) (Oral)    Resp 14    Ht 6' (1.829 m)    Wt 197 lb (89.4 kg)    SpO2 99%    BMI 26.72 kg/m??     Patient's medical, social, and family history reviewed      Physical Exam  Physical Exam   Constitutional: He is oriented to person, place, and time. He appears well-developed and well-nourished.   HENT:   Head: Normocephalic.   Eyes: Pupils are equal, round, and reactive to light. Right conjunctiva is injected. Left conjunctiva is injected.   Neck: Normal range of motion. Neck supple. No thyromegaly present.   Cardiovascular: Normal rate and regular rhythm.   Pulmonary/Chest: Effort normal. No accessory muscle usage. No tachypnea. No respiratory distress. He has wheezes.   Abdominal: Soft. Bowel sounds are normal.   Musculoskeletal: Normal range of motion.   Lymphadenopathy:     He has no cervical adenopathy.   Neurological: He is alert and oriented to person, place, and time.         Assessment/Plan:    1. Other conjunctivitis of both eyes    - moxifloxacin (VIGAMOX) 0.5 % ophthalmic solution; Place 1 drop into both eyes 3 times daily for 7 days  Dispense: 1 Bottle; Refill:  0    2. Pharyngitis, unspecified etiology    - POCT rapid strep A    3. Mild persistent asthma without complication    - albuterol sulfate HFA 108 (90 Base) MCG/ACT inhaler; Inhale 2 puffs into the lungs every 6 hours as needed for Wheezing or Shortness of Breath  Dispense: 1 Inhaler; Refill: 3  - ADVAIR DISKUS 100-50 MCG/DOSE diskus inhaler; Inhale 1 puff into the lungs every 12 hours  Dispense: 60 each; Refill: 3  - methylPREDNISolone (MEDROL DOSEPACK) 4 MG tablet; Take by mouth as directed  Dispense: 1 kit; Refill: 0    Patient to establish PCP in this office in 5-7 days. Return sooner if no improvement in symptoms. Patient verbalized understanding.     Return in about 5 days (around 11/26/2017).      Retta Mac, APRN - CNP

## 2017-11-25 ENCOUNTER — Encounter: Attending: Family Medicine

## 2017-12-09 ENCOUNTER — Encounter: Attending: Family Medicine

## 2018-02-21 ENCOUNTER — Ambulatory Visit: Admit: 2018-02-21 | Discharge: 2018-02-21 | Payer: PRIVATE HEALTH INSURANCE | Attending: Adult Health

## 2018-02-21 DIAGNOSIS — J4530 Mild persistent asthma, uncomplicated: Secondary | ICD-10-CM

## 2018-02-21 MED ORDER — ALBUTEROL SULFATE HFA 108 (90 BASE) MCG/ACT IN AERS
108 (90 Base) MCG/ACT | Freq: Four times a day (QID) | RESPIRATORY_TRACT | 2 refills | Status: DC | PRN
Start: 2018-02-21 — End: 2018-11-10

## 2018-02-21 MED ORDER — METHYLPREDNISOLONE 4 MG PO TBPK
4 MG | PACK | ORAL | 0 refills | Status: DC
Start: 2018-02-21 — End: 2018-02-26

## 2018-02-21 MED ORDER — ADVAIR DISKUS 100-50 MCG/DOSE IN AEPB
100-50 MCG/DOSE | Freq: Two times a day (BID) | RESPIRATORY_TRACT | 2 refills | Status: DC
Start: 2018-02-21 — End: 2018-08-31

## 2018-02-21 MED ORDER — GUAIFENESIN ER 600 MG PO TB12
600 | ORAL_TABLET | Freq: Two times a day (BID) | ORAL | 1 refills | Status: DC
Start: 2018-02-21 — End: 2018-02-26

## 2018-02-21 NOTE — Progress Notes (Signed)
Chief Complaint   Patient presents with   ??? Asthma     wheezing cough and phelgm been out meds due to moving from [place] and having to switch insurance        HPI:  Patient presents today for complaints of asthma flare up and chest congestion. He has not had his asthma medication for almost 3 months. He was previously seen in the office about 2 months ago - however he did not have insurance at that time and was unable to pick up his asthma medication. He does not yet have a PCP in this area. He tells me that his chest has been feeling a little tight without his medication, as well as a recent increase in chest congestion. He stopped smoking 4 months ago. Denies chest pain with radiation or any other increase in shortness of breath.       Prior to Visit Medications    Medication Sig Taking? Authorizing Provider   albuterol (ACCUNEB) 0.63 MG/3ML nebulizer solution Take 1 ampule by nebulization every 6 hours as needed for Wheezing Yes Historical Provider, MD   ibuprofen (ADVIL;MOTRIN) 100 MG/5ML suspension Take by mouth every 4 hours as needed for Fever Yes Historical Provider, MD   albuterol sulfate HFA 108 (90 Base) MCG/ACT inhaler Inhale 2 puffs into the lungs every 6 hours as needed for Wheezing or Shortness of Breath Yes Retta Mac, APRN - CNP   ADVAIR DISKUS 100-50 MCG/DOSE diskus inhaler Inhale 1 puff into the lungs every 12 hours Yes Retta Mac, APRN - CNP   acetaminophen (TYLENOL) 500 MG tablet Take 1 tablet by mouth every 6 hours as needed for Pain Yes Retta Mac, APRN - CNP         No Known Allergies      Review of Systems  Review of Systems   Constitutional: Negative for appetite change, chills and fever.   HENT: Positive for postnasal drip. Negative for congestion and nosebleeds.    Respiratory: Positive for cough and chest tightness. Negative for shortness of breath and wheezing.    Cardiovascular: Negative for chest pain, palpitations and leg swelling.   Gastrointestinal: Negative  for constipation, diarrhea, nausea and vomiting.   Genitourinary: Negative for dysuria and urgency.   Musculoskeletal: Negative for neck pain.   Neurological: Negative for dizziness, numbness and headaches.         VS:  BP 128/86    Pulse 81    Resp 16    Ht 6' 1"  (1.854 m)    Wt 210 lb (95.3 kg)    SpO2 99%    BMI 27.71 kg/m??     Patient's medical, social, and family history reviewed      Physical Exam  Physical Exam   Constitutional: He is oriented to person, place, and time. He appears well-developed and well-nourished.   HENT:   Head: Normocephalic.   Eyes: Pupils are equal, round, and reactive to light.   Neck: Normal range of motion. Neck supple. No thyromegaly present.   Cardiovascular: Normal rate and regular rhythm.   Pulmonary/Chest: Effort normal. He has decreased breath sounds. He has no wheezes.   Abdominal: Soft. Bowel sounds are normal.   Musculoskeletal: Normal range of motion.   Lymphadenopathy:     He has no cervical adenopathy.   Neurological: He is alert and oriented to person, place, and time.         Assessment/Plan:    1. Mild persistent asthma without complication  Meds refilled for patient   He is to make an appointment for new PCP in our office   - ADVAIR DISKUS 100-50 MCG/DOSE diskus inhaler; Inhale 1 puff into the lungs every 12 hours  Dispense: 60 each; Refill: 2  - albuterol sulfate HFA 108 (90 Base) MCG/ACT inhaler; Inhale 2 puffs into the lungs every 6 hours as needed for Wheezing or Shortness of Breath  Dispense: 1 Inhaler; Refill: 2  - methylPREDNISolone (MEDROL DOSEPACK) 4 MG tablet; Take as directed  Dispense: 1 kit; Refill: 0  - guaiFENesin (MUCINEX) 600 MG extended release tablet; Take 2 tablets by mouth 2 times daily  Dispense: 30 tablet; Refill: 1    Return if symptoms worsen or fail to improve.        Retta Mac, APRN - CNP

## 2018-02-26 ENCOUNTER — Ambulatory Visit: Admit: 2018-02-26 | Discharge: 2018-02-26 | Payer: PRIVATE HEALTH INSURANCE | Attending: Family Medicine

## 2018-02-26 DIAGNOSIS — Z0000 Encounter for general adult medical examination without abnormal findings: Secondary | ICD-10-CM

## 2018-02-26 DIAGNOSIS — E663 Overweight: Secondary | ICD-10-CM

## 2018-02-26 NOTE — Progress Notes (Signed)
[facility]  Family Medicine Outpatient        SUBJECTIVE:  CC: had concerns including Establish Care (no complaints).  HPI:Ray Rodgers presented to the clinic for a routine visit.     She is a 37 year old male presenting to the office today to establish care.  He recently moved to [place] from the [place] area.  He reports that his only past medical history is asthma which is controlled with his Advair regimen with as needed albuterol.  He does not report using his albuterol inhaler more than twice a week.  Got a flu shot [facility] his [facility].  Denies any acute concerns.     Review of Systems   Constitutional: Negative for activity change, appetite change, fatigue and fever.   HENT: Negative for congestion, postnasal drip, rhinorrhea, sinus pressure, sneezing and sore throat.    Eyes: Negative for pain and discharge.   Respiratory: Negative for cough, chest tightness, shortness of breath and wheezing.    Cardiovascular: Negative for chest pain, palpitations and leg swelling.   Gastrointestinal: Negative for abdominal distention, abdominal pain, constipation, diarrhea and vomiting.   Endocrine: Negative for cold intolerance and heat intolerance.   Genitourinary: Negative for decreased urine volume, frequency and urgency.   Musculoskeletal: Negative for arthralgias and back pain.   Skin: Negative for color change and rash.   Allergic/Immunologic: Negative for food allergies and immunocompromised state.   Neurological: Negative for dizziness, syncope, weakness, numbness and headaches.   Psychiatric/Behavioral: Negative for agitation and behavioral problems.       Outpatient Medications Marked as Taking for the 02/26/18 encounter (Office Visit) with Vilinda Blanks, MD   Medication Sig Dispense Refill   ??? ADVAIR DISKUS 100-50 MCG/DOSE diskus inhaler Inhale 1 puff into the lungs every 12 hours 60 each 2   ??? albuterol sulfate HFA 108 (90 Base) MCG/ACT inhaler Inhale 2 puffs into the lungs every 6 hours as  needed for Wheezing or Shortness of Breath 1 Inhaler 2   ??? albuterol (ACCUNEB) 0.63 MG/3ML nebulizer solution Take 1 ampule by nebulization every 6 hours as needed for Wheezing         I have reviewed all pertinent PMHx, PSHx, FamHx, SocialHx, medications, and allergies and updated history as appropriate.    OBJECTIVE    VS: BP 133/83    Pulse 89    Temp 99.3 ??F (37.4 ??C)    Resp 16    Ht 6\' 1"  (1.854 m)    Wt 209 lb (94.8 kg)    SpO2 98%    BMI 27.57 kg/m??   Physical Exam  Constitutional:       General: He is not in acute distress.     Appearance: He is well-developed. He is not diaphoretic.   HENT:      Head: Normocephalic and atraumatic.      Right Ear: Tympanic membrane, ear canal and external ear normal.      Left Ear: Tympanic membrane, ear canal and external ear normal.      Mouth/Throat:      Mouth: Mucous membranes are moist.      Pharynx: Oropharynx is clear.   Eyes:      Conjunctiva/sclera: Conjunctivae normal.      Pupils: Pupils are equal, round, and reactive to light.   Neck:      Musculoskeletal: Normal range of motion and neck supple.   Cardiovascular:      Rate and Rhythm: Normal rate and regular rhythm.  Heart sounds: Normal heart sounds. No murmur. No friction rub. No gallop.    Pulmonary:      Effort: Pulmonary effort is normal.      Breath sounds: Normal breath sounds.   Abdominal:      General: Bowel sounds are normal.      Palpations: Abdomen is soft.      Tenderness: There is no tenderness.      Hernia: No hernia is present.   Musculoskeletal: Normal range of motion.         General: No tenderness.   Lymphadenopathy:      Cervical: No cervical adenopathy.   Skin:     General: Skin is warm and dry.   Neurological:      Mental Status: He is alert and oriented to person, place, and time.      Cranial Nerves: No cranial nerve deficit.      Sensory: No sensory deficit.      Motor: No weakness.   Psychiatric:         Mood and Affect: Mood normal.         Behavior: Behavior normal.          ASSESSMENT/PLAN:  1. Encounter for annual physical exam  Physical exam as above; Tdap and pneumonia vaccine today.  Patient [facility]  Patient reports previous HIV screening negative and denies any concerns. Labs as below.  - Tdap (age 7110y and older) IM (BOOSTRIX)    2. Mild persistent asthma without complication  Controlled; continue Advair with as needed albuterol.  Patient does not report using his albuterol inhaler more than twice a week.  Continue to monitor.    3. Former smoker  Quit July 2019.  - Pneumococcal polysaccharide vaccine 23-valent greater than or equal to 2yo subcutaneous/IM    4. Overweight (BMI 25.0-29.9)  BMI 27; lifestyle recommendations recommended.  Well-balanced diet and exercising 150 minutes a week as tolerated.  - CBC; Future  - Comprehensive Metabolic Panel; Future  - Lipid Panel; Future  - TSH without Reflex; Future  - Vitamin D 25 Hydroxy; Future      I have reviewed my findings and recommendations with Ray Rodgers    Ray Pavao Baroff-Rufo, MD  02/26/2018 2:56 PM     Counseled regarding above diagnosis, including possible risks and complications, especially if left uncontrolled.     Discussed medications risk/benefits and possible side effects and alternatives to treatment. Patient and/or guardian verbalizes understanding, agrees, feels comfortable with and wishes to proceed with above treatment plan.      Advised patient regarding importance of keeping up with recommended health maintenance and to schedule as soon as possible if overdue, as this is important in assessing for undiagnosed pathology, especially cancer, as well as protecting against potentially harmful/life threatening disease.       Patient and/or guardian verbalizes understanding and agrees with above counseling, assessment and plan. All questions answered.    Please note this report has been partially produced using speech recognition software  and may contain errors related  to that system including grammar, punctuation and spelling as well as words and phrases that may seem inappropriate. If there are questions or concerns please feel free to contact me to clarify.

## 2018-02-27 ENCOUNTER — Inpatient Hospital Stay: Payer: PRIVATE HEALTH INSURANCE

## 2018-02-27 LAB — CBC
Hematocrit: 46.6 % (ref 37.0–54.0)
Hemoglobin: 15 g/dL (ref 12.5–16.5)
MCH: 27.7 pg (ref 26.0–35.0)
MCHC: 32.2 % (ref 32.0–34.5)
MCV: 86.1 fL (ref 80.0–99.9)
MPV: 10.9 fL (ref 7.0–12.0)
Platelets: 238 E9/L (ref 130–450)
RBC: 5.41 E12/L (ref 3.80–5.80)
RDW: 13.6 fL (ref 11.5–15.0)
WBC: 7.6 E9/L (ref 4.5–11.5)

## 2018-02-27 LAB — COMPREHENSIVE METABOLIC PANEL
ALT: 56 U/L — ABNORMAL HIGH (ref 0–40)
AST: 26 U/L (ref 0–39)
Albumin: 4.6 g/dL (ref 3.5–5.2)
Alkaline Phosphatase: 127 U/L (ref 40–129)
Anion Gap: 14 mmol/L (ref 7–16)
BUN: 12 mg/dL (ref 6–20)
CO2: 24 mmol/L (ref 22–29)
Calcium: 9.6 mg/dL (ref 8.6–10.2)
Chloride: 103 mmol/L (ref 98–107)
Creatinine: 1.2 mg/dL (ref 0.7–1.2)
GFR African American: >60
GFR Non-African American: >60 mL/min/{1.73_m2} (ref >=60)
Glucose: 83 mg/dL (ref 74–99)
Potassium: 4.3 mmol/L (ref 3.5–5.0)
Sodium: 141 mmol/L (ref 132–146)
Total Bilirubin: 0.3 mg/dL (ref 0.0–1.2)
Total Protein: 7.9 g/dL (ref 6.4–8.3)

## 2018-02-27 LAB — LIPID PANEL
Cholesterol, Total: 189 mg/dL (ref 0–199)
HDL: 44 mg/dL (ref >40)
LDL Calculated: 115 mg/dL — ABNORMAL HIGH (ref 0–99)
Triglycerides: 149 mg/dL (ref 0–149)
VLDL Cholesterol Calculated: 30 mg/dL

## 2018-02-27 LAB — TSH: TSH: 1.8 u[IU]/mL (ref 0.270–4.200)

## 2018-02-27 LAB — VITAMIN D 25 HYDROXY: Vit D, 25-Hydroxy: 38 ng/mL (ref 30–100)

## 2018-08-31 ENCOUNTER — Inpatient Hospital Stay
Admit: 2018-08-31 | Discharge: 2018-08-31 | Disposition: A | Discharge status: Home / Self Care | Payer: PRIVATE HEALTH INSURANCE

## 2018-08-31 ENCOUNTER — Emergency Department: Admit: 2018-08-31 | Payer: PRIVATE HEALTH INSURANCE

## 2018-08-31 DIAGNOSIS — J4521 Mild intermittent asthma with (acute) exacerbation: Secondary | ICD-10-CM

## 2018-08-31 MED ORDER — IPRATROPIUM-ALBUTEROL 0.5-2.5 (3) MG/3ML IN SOLN
RESPIRATORY_TRACT | 1 refills | Status: DC | PRN
Start: 2018-08-31 — End: 2018-11-04

## 2018-08-31 MED ORDER — NEBULIZER/TUBING/MOUTHPIECE KIT
PACK | Freq: Every day | 0 refills | Status: DC | PRN
Start: 2018-08-31 — End: 2018-11-10

## 2018-08-31 MED ORDER — IPRATROPIUM-ALBUTEROL 0.5-2.5 (3) MG/3ML IN SOLN
Freq: Once | RESPIRATORY_TRACT | Status: AC
Start: 2018-08-31 — End: 2018-08-31
  Administered 2018-08-31: 20:00:00 1 via RESPIRATORY_TRACT

## 2018-08-31 MED ORDER — METHYLPREDNISOLONE 4 MG PO TBPK
4 MG | PACK | ORAL | 0 refills | Status: AC
Start: 2018-08-31 — End: 2018-09-06

## 2018-08-31 MED ORDER — IPRATROPIUM-ALBUTEROL 0.5-2.5 (3) MG/3ML IN SOLN
Freq: Once | RESPIRATORY_TRACT | Status: AC
Start: 2018-08-31 — End: 2018-08-31
  Administered 2018-08-31: 19:00:00 1 via RESPIRATORY_TRACT

## 2018-08-31 MED ORDER — DEXAMETHASONE SODIUM PHOSPHATE 10 MG/ML IJ SOLN
10 MG/ML | Freq: Once | INTRAMUSCULAR | Status: AC
Start: 2018-08-31 — End: 2018-08-31
  Administered 2018-08-31: 19:00:00 10 mg via INTRAMUSCULAR

## 2018-08-31 MED ORDER — ALBUTEROL SULFATE HFA 108 (90 BASE) MCG/ACT IN AERS
108 (90 Base) MCG/ACT | Freq: Four times a day (QID) | RESPIRATORY_TRACT | 2 refills | Status: DC | PRN
Start: 2018-08-31 — End: 2018-11-10

## 2018-08-31 MED ORDER — ADVAIR DISKUS 100-50 MCG/DOSE IN AEPB
100-50 MCG/DOSE | Freq: Two times a day (BID) | RESPIRATORY_TRACT | 2 refills | Status: DC
Start: 2018-08-31 — End: 2018-11-04

## 2018-08-31 MED FILL — DEXAMETHASONE SODIUM PHOSPHATE 10 MG/ML IJ SOLN: 10 mg/mL | INTRAMUSCULAR | Qty: 1

## 2018-08-31 NOTE — ED Notes (Signed)
Respiratory called for treatments     Lillia Abed, RN  08/31/18 1430

## 2018-08-31 NOTE — ED Provider Notes (Signed)
[facility]     Department of Emergency Medicine   ED  Provider Note  Admit Date/RoomTime: 08/31/2018  1:58 PM  ED Room: 02/02    Chief Complaint:   Asthma    History of Present Illness      Ray Rodgers is a 38 y.o. old male who presents to the ED for asthma exacerbation.  Patient states the symptoms began last night.  He states he ran out of his inhaler.  He does report some chest tightness but denies any pain.  He has shortness of breath with exertion.  Patient reports mild cough.  He denies headache, dizziness, fever/chills, neck pain, back pain, abdominal pain, nausea, vomiting, diarrhea, limb pain or swelling, or recent travel.  Patient denies any sick contacts.  He is alert and oriented x3 and in no apparent distress at this exam.  He is nontoxic-appearing.    ROS   Pertinent positives and negatives are stated within HPI, all other systems reviewed and are negative.    Past Medical History:  has a past medical history of Asthma.    Past Surgical History:  has no past surgical history on file.    Social History:  reports that he quit smoking about 10 months ago. His smoking use included cigarettes. He has never used smokeless tobacco. He reports that he does not drink alcohol or use drugs.    Family History: family history is not on file.     The patient???s home medications have been reviewed.    Allergies: Patient has no known allergies.  Allergies have been reviewed with patient.     Physical Exam   VS:  BP 130/65    Pulse 109    Temp 97.8 ??F (36.6 ??C) (Temporal)    Resp 16    Ht 6' (1.829 m)    Wt 187 lb (84.8 kg)    SpO2 96%    BMI 25.36 kg/m??      Oxygen Saturation Interpretation: Normal.    Constitutional:  Alert, development consistent with age. NAD  Eyes: EOMI, non-injected conjunctiva   Ears:  External Ears: Bilateral normal.              TM's & External Canals:  normal TM's and external ear canals both ears.  Throat: no erythema or exudates noted., uvula midline, Airway patent      Neck/Lymphatic: Supple.There is no cervical node tenderness. Non-tender with no meningeal signs   Respiratory:  Bilateral wheezing, 96& room air, respirations 16   CV: Regular rate and rhythm  Abdomen: Soft, non-tender, non-distended  Integument:  No rashes or erythema present.  Neurological:  Motor functions intact.    Lab / Imaging Results   (All laboratory and radiology results have been personally reviewed by myself)  Labs:  No results found for this visit on 08/31/18.    Imaging:  All Radiology results interpreted by Radiologist unless otherwise noted.  XR CHEST STANDARD (2 VW)   Final Result   There is no evidence of acute cardiopulmonary pathology.      There is borderline pulmonary hyperinflation with some minimal   thickening of central airways, which is nonspecific.        ED Course / Medical Decision Making   ED Medications:   Medications   ipratropium-albuterol (DUONEB) nebulizer solution 1 ampule (has no administration in time range)   ipratropium-albuterol (DUONEB) nebulizer solution 1 ampule (1 ampule Inhalation Given 08/31/18 1442)   dexamethasone (DECADRON) injection 10 mg (10  mg Intramuscular Given 08/31/18 1432)     Consults:  None    Procedures:  none     Medical Decision Making:   Patient is well appearing, non toxic and appropriate for outpatient management.  Plan is for symptom management and PCP follow up.     Counseling:    The emergency provider has spoken with the patient and/or caregiver and discussed today???s results, in addition to providing specific details for the plan of care and counseling regarding the diagnosis and prognosis.  Questions are answered at this time and they are agreeable with the plan. All results reviewed with pt and all questions answered.    Patient's lung sounds have significantly improved after breathing treatments.    I discussed the differential, results and discharge plan with the patient and/or family/friend/caregiver if present.  I emphasized the importance  of follow-up with the physician I referred them to in the timeframe recommended.  I explained reasons for the patient to return to the Emergency Department. Additional verbal discharge instructions were also given and discussed with the patient to supplement those generated by the EMR. We also discussed medications that were prescribed (if any) including common side effects and interactions. All questions were addressed.  They understand return precautions and discharge instructions. The patient and/or family/friend/caregiver expressed understanding. Vitals were stable and they were in no distress at discharge.     Assessment     1. Mild intermittent asthma with exacerbation    2. Mild persistent asthma without complication      Plan   Discharge to home  Patient condition is good    New Medications     New Prescriptions    ALBUTEROL SULFATE HFA (PROAIR HFA) 108 (90 BASE) MCG/ACT INHALER    Inhale 2 puffs into the lungs every 6 hours as needed for Wheezing    IPRATROPIUM-ALBUTEROL (DUONEB) 0.5-2.5 (3) MG/3ML SOLN NEBULIZER SOLUTION    Take 3 mLs by nebulization every 4 hours as needed for Shortness of Breath    METHYLPREDNISOLONE (MEDROL, PAK,) 4 MG TABLET    Take by mouth.    RESPIRATORY THERAPY SUPPLIES (NEBULIZER/TUBING/MOUTHPIECE) KIT    1 kit by Does not apply route daily as needed (asthma)       Electronically signed by Alanson Puls, PA-C   DD: 08/31/18  **This report was transcribed using voice recognition software. Every effort was made to ensure accuracy; however, inadvertent computerized transcription errors may be present.    END OF ED PROVIDER NOTE          Alanson Puls, PA-C  08/31/18 1548

## 2018-09-01 NOTE — Care Coordination-Inpatient (Signed)
Call #1    Attempted outreach to complete ED f/u note.  Left message with contact information requesting call back.   Encouraged outreach to PCP Dr. Laverle Patter to schedule f/u appointment @ [phone].    PLAN    ED f/u call #2 tomorrow

## 2018-09-02 NOTE — Care Coordination-Inpatient (Signed)
Call #2    Attempted outreach to complete ED f/u note.  Left message with contact information requesting call back.  Encouraged outreach to PCP Dr. Laverle Patter to schedule f/u appointment @ [phone] and also sign up for MyChart with information listed on recent AVS summary paperwork.     PLAN    If no outreach per Vonna Kotyk, will resolve episode d/t UTR x 2 calls.

## 2018-10-31 ENCOUNTER — Inpatient Hospital Stay
Admit: 2018-10-31 | Discharge: 2018-10-31 | Disposition: A | Discharge status: Home / Self Care | Payer: PRIVATE HEALTH INSURANCE | Attending: Emergency Medicine

## 2018-10-31 ENCOUNTER — Emergency Department: Admit: 2018-10-31 | Payer: PRIVATE HEALTH INSURANCE

## 2018-10-31 DIAGNOSIS — J4541 Moderate persistent asthma with (acute) exacerbation: Secondary | ICD-10-CM

## 2018-10-31 LAB — CBC WITH AUTO DIFFERENTIAL
Basophils %: 1.5 % (ref 0.0–2.0)
Basophils Absolute: 0.1 E9/L (ref 0.00–0.20)
Eosinophils %: 10.7 % — ABNORMAL HIGH (ref 0.0–6.0)
Eosinophils Absolute: 0.7 E9/L — ABNORMAL HIGH (ref 0.05–0.50)
Hematocrit: 49.8 % (ref 37.0–54.0)
Hemoglobin: 16.3 g/dL (ref 12.5–16.5)
Immature Granulocytes #: 0.01 E9/L
Immature Granulocytes %: 0.2 % (ref 0.0–5.0)
Lymphocytes %: 32.4 % (ref 20.0–42.0)
Lymphocytes Absolute: 2.12 E9/L (ref 1.50–4.00)
MCH: 28.1 pg (ref 26.0–35.0)
MCHC: 32.7 % (ref 32.0–34.5)
MCV: 85.9 fL (ref 80.0–99.9)
MPV: 10.6 fL (ref 7.0–12.0)
Monocytes %: 7 % (ref 2.0–12.0)
Monocytes Absolute: 0.46 E9/L (ref 0.10–0.95)
Neutrophils %: 48.2 % (ref 43.0–80.0)
Neutrophils Absolute: 3.16 E9/L (ref 1.80–7.30)
Platelets: 217 E9/L (ref 130–450)
RBC: 5.8 E12/L (ref 3.80–5.80)
RDW: 13.8 fL (ref 11.5–15.0)
WBC: 6.6 E9/L (ref 4.5–11.5)

## 2018-10-31 LAB — COVID-19: SARS-CoV-2, NAAT: NOT DETECTED

## 2018-10-31 LAB — COMPREHENSIVE METABOLIC PANEL W/ REFLEX TO MG FOR LOW K
ALT: 27 U/L (ref 0–40)
AST: 27 U/L (ref 0–39)
Albumin: 4.6 g/dL (ref 3.5–5.2)
Alkaline Phosphatase: 129 U/L (ref 40–129)
Anion Gap: 11 mmol/L (ref 7–16)
BUN: 11 mg/dL (ref 6–20)
CO2: 25 mmol/L (ref 22–29)
Calcium: 9.6 mg/dL (ref 8.6–10.2)
Chloride: 107 mmol/L (ref 98–107)
Creatinine: 1.3 mg/dL — ABNORMAL HIGH (ref 0.7–1.2)
GFR African American: >60
GFR Non-African American: >60 mL/min/{1.73_m2} (ref >=60)
Glucose: 90 mg/dL (ref 74–99)
Potassium reflex Magnesium: 4.1 mmol/L (ref 3.5–5.0)
Sodium: 143 mmol/L (ref 132–146)
Total Bilirubin: 0.4 mg/dL (ref 0.0–1.2)
Total Protein: 7.3 g/dL (ref 6.4–8.3)

## 2018-10-31 LAB — TROPONIN: Troponin: <0.01 ng/mL (ref 0.00–0.03)

## 2018-10-31 MED ORDER — IPRATROPIUM BROMIDE HFA 17 MCG/ACT IN AERS
17 MCG/ACT | Freq: Four times a day (QID) | RESPIRATORY_TRACT | Status: DC
Start: 2018-10-31 — End: 2018-10-31
  Administered 2018-10-31: 18:00:00 2 via RESPIRATORY_TRACT

## 2018-10-31 MED ORDER — ALBUTEROL SULFATE HFA 108 (90 BASE) MCG/ACT IN AERS
108 (90 Base) MCG/ACT | Freq: Four times a day (QID) | RESPIRATORY_TRACT | Status: DC
Start: 2018-10-31 — End: 2018-10-31
  Administered 2018-10-31: 19:00:00 2 via RESPIRATORY_TRACT

## 2018-10-31 MED ORDER — KETOROLAC TROMETHAMINE 30 MG/ML IJ SOLN
30 MG/ML | Freq: Once | INTRAMUSCULAR | Status: AC
Start: 2018-10-31 — End: 2018-10-31
  Administered 2018-10-31: 18:00:00 30 mg via INTRAVENOUS

## 2018-10-31 MED ORDER — MAGNESIUM SULFATE 2000 MG/50 ML IVPB PREMIX
2 GM/50ML | Freq: Once | INTRAVENOUS | Status: DC
Start: 2018-10-31 — End: 2018-10-31

## 2018-10-31 MED ORDER — SODIUM CHLORIDE 0.9 % IV BOLUS
0.9 % | Freq: Once | INTRAVENOUS | Status: AC
Start: 2018-10-31 — End: 2018-10-31
  Administered 2018-10-31: 17:00:00 1000 mL via INTRAVENOUS

## 2018-10-31 MED ORDER — PREDNISONE 20 MG PO TABS
20 MG | ORAL_TABLET | Freq: Two times a day (BID) | ORAL | 0 refills | Status: AC
Start: 2018-10-31 — End: 2018-11-04

## 2018-10-31 MED ORDER — MAGNESIUM SULFATE 2000 MG/50 ML IVPB PREMIX
2 GM/50ML | Freq: Once | INTRAVENOUS | Status: AC
Start: 2018-10-31 — End: 2018-10-31
  Administered 2018-10-31: 17:00:00 2 g via INTRAVENOUS

## 2018-10-31 MED FILL — MAGNESIUM SULFATE 2 GM/50ML IV SOLN: 2 GM/50ML | INTRAVENOUS | Qty: 50

## 2018-10-31 MED FILL — ATROVENT HFA 17 MCG/ACT IN AERS: 17 MCG/ACT | RESPIRATORY_TRACT | Qty: 12.9

## 2018-10-31 MED FILL — ALBUTEROL SULFATE HFA 108 (90 BASE) MCG/ACT IN AERS: 108 (90 Base) MCG/ACT | RESPIRATORY_TRACT | Qty: 18

## 2018-10-31 MED FILL — KETOROLAC TROMETHAMINE 30 MG/ML IJ SOLN: 30 MG/ML | INTRAMUSCULAR | Qty: 1

## 2018-10-31 NOTE — ED Notes (Signed)
Bed: 11  Expected date:   Expected time:   Means of arrival:   Comments:  [facility]     Thane Edu, RN  10/31/18 1246

## 2018-10-31 NOTE — ED Provider Notes (Signed)
Patient is a 38 year old male presenting to the emergency department due to an asthma exacerbation.  Patient states that he ran out of his albuterol inhaler and try to walk to the emergency department came increasing shortness of breath with chills without fever.  He then states he called the ambulance and was brought to the emergency department.  He states he has had a cough as well and some pleuritic chest pain.  Patient states his symptoms are typical of his asthma exacerbation.  Patient was given 125 Solu-Medrol as well as albuterol inhaler in route by EMS.           Symptoms are worsened by nothing       Symptoms are improved by nothing    Denies any associated nausea, vomiting, diarrhea, fever    Review of Systems   Constitutional: Positive for chills and fatigue. Negative for fever.   HENT: Negative for congestion, facial swelling, hearing loss and sore throat.    Eyes: Negative for photophobia, pain and redness.   Respiratory: Positive for cough and shortness of breath. Negative for chest tightness.    Cardiovascular: Positive for chest pain. Negative for palpitations.   Gastrointestinal: Negative for abdominal pain, constipation, diarrhea, nausea and vomiting.   Endocrine: Negative for cold intolerance, polydipsia and polyuria.   Genitourinary: Negative for dysuria, flank pain and frequency.   Musculoskeletal: Negative for arthralgias, joint swelling and myalgias.   Skin: Negative.    Allergic/Immunologic: Negative.    Neurological: Negative.         Physical Exam  Vitals signs and nursing note reviewed.   Constitutional:       General: He is not in acute distress.     Appearance: Normal appearance. He is well-developed. He is ill-appearing.   HENT:      Head: Normocephalic and atraumatic.      Right Ear: Tympanic membrane normal.      Left Ear: Tympanic membrane normal.      Nose: Nose normal. No congestion.      Mouth/Throat:      Mouth: Mucous membranes are moist.      Pharynx: Oropharynx is clear.    Eyes:      Pupils: Pupils are equal, round, and reactive to light.   Neck:      Musculoskeletal: Normal range of motion and neck supple.   Cardiovascular:      Rate and Rhythm: Regular rhythm. Tachycardia present.      Pulses: Normal pulses.      Heart sounds: Normal heart sounds. No murmur.   Pulmonary:      Effort: Respiratory distress present.      Breath sounds: Wheezing present. No rales.   Abdominal:      General: Bowel sounds are normal.      Palpations: Abdomen is soft.      Tenderness: There is no abdominal tenderness. There is no guarding or rebound.   Musculoskeletal:         General: No swelling.   Skin:     General: Skin is warm and dry.   Neurological:      Mental Status: He is alert and oriented to person, place, and time.      Cranial Nerves: No cranial nerve deficit.      Coordination: Coordination normal.          Procedures     EKG:  This EKG is signed and interpreted by me.    Rate: 105  Rhythm: Sinus  Interpretation:  sinus tachycardia  Comparison: stable as compared to patient's most recent EKG       MDM  Number of Diagnoses or Management Options  Moderate persistent asthma with acute exacerbation:   Diagnosis management comments: Patient presented with acute asthma exacerbation.  He was given 125 Solu-Medrol in route by EMS.  When he arrived in the emergency department patient was placed on oxygen via nasal cannula and given 2 g of magnesium over 20 minutes and albuterol metered-dose inhaler.  Patient was also test for Covid19 and chest x-ray to show if there is any pneumonia as well as lab work to show any infectious cause.  Is found that he had a asthma exacerbation which fits with his story.  Patient will be discharged with a replacement inhaler due to him not having one currently and given a prescription for prednisone for 4 days to improved his asthma symptoms.            --------------------------------------------- PAST HISTORY ---------------------------------------------  Past  Medical History:  has a past medical history of Asthma.    Past Surgical History:  has no past surgical history on file.    Social History:  reports that he quit smoking about a year ago. His smoking use included cigarettes. He has never used smokeless tobacco. He reports that he does not drink alcohol or use drugs.    Family History: family history is not on file.     The patient???s home medications have been reviewed.    Allergies: Patient has no known allergies.    -------------------------------------------------- RESULTS -------------------------------------------------  Labs:  Results for orders placed or performed during the hospital encounter of 10/31/18   CBC Auto Differential   Result Value Ref Range    WBC 6.6 4.5 - 11.5 E9/L    RBC 5.80 3.80 - 5.80 E12/L    Hemoglobin 16.3 12.5 - 16.5 g/dL    Hematocrit 10.949.8 60.437.0 - 54.0 %    MCV 85.9 80.0 - 99.9 fL    MCH 28.1 26.0 - 35.0 pg    MCHC 32.7 32.0 - 34.5 %    RDW 13.8 11.5 - 15.0 fL    Platelets 217 130 - 450 E9/L    MPV 10.6 7.0 - 12.0 fL    Neutrophils % 48.2 43.0 - 80.0 %    Immature Granulocytes % 0.2 0.0 - 5.0 %    Lymphocytes % 32.4 20.0 - 42.0 %    Monocytes % 7.0 2.0 - 12.0 %    Eosinophils % 10.7 (H) 0.0 - 6.0 %    Basophils % 1.5 0.0 - 2.0 %    Neutrophils Absolute 3.16 1.80 - 7.30 E9/L    Immature Granulocytes # 0.01 E9/L    Lymphocytes Absolute 2.12 1.50 - 4.00 E9/L    Monocytes Absolute 0.46 0.10 - 0.95 E9/L    Eosinophils Absolute 0.70 (H) 0.05 - 0.50 E9/L    Basophils Absolute 0.10 0.00 - 0.20 E9/L   Comprehensive Metabolic Panel w/ Reflex to MG   Result Value Ref Range    Sodium 143 132 - 146 mmol/L    Potassium reflex Magnesium 4.1 3.5 - 5.0 mmol/L    Chloride 107 98 - 107 mmol/L    CO2 25 22 - 29 mmol/L    Anion Gap 11 7 - 16 mmol/L    Glucose 90 74 - 99 mg/dL    BUN 11 6 - 20 mg/dL    CREATININE 1.3 (H) 0.7 - 1.2 mg/dL  GFR Non-African American >60 >=60 mL/min/1.73    GFR African American >60     Calcium 9.6 8.6 - 10.2 mg/dL    Total  Protein 7.3 6.4 - 8.3 g/dL    Alb 4.6 3.5 - 5.2 g/dL    Total Bilirubin 0.4 0.0 - 1.2 mg/dL    Alkaline Phosphatase 129 40 - 129 U/L    ALT 27 0 - 40 U/L    AST 27 0 - 39 U/L   Troponin   Result Value Ref Range    Troponin <0.01 0.00 - 0.03 ng/mL   COVID-19   Result Value Ref Range    SARS-CoV-2, NAAT Not Detected Not Detected   EKG 12 Lead   Result Value Ref Range    Ventricular Rate 105 BPM    Atrial Rate 105 BPM    P-R Interval 136 ms    QRS Duration 86 ms    Q-T Interval 326 ms    QTc Calculation (Bazett) 430 ms    P Axis 75 degrees    R Axis 5 degrees    T Axis 53 degrees       Radiology:  XR CHEST PORTABLE   Final Result   Haziness in the right lateral lung is almost certainly due to   overlying soft tissue. Otherwise, no evidence of focal consolidation.             ------------------------- NURSING NOTES AND VITALS REVIEWED ---------------------------  Date / Time Roomed:  10/31/2018 12:46 PM  ED Bed Assignment:  OTF/OTF    The nursing notes within the ED encounter and vital signs as below have been reviewed.   BP (!) 145/98    Pulse 105    Temp 98.4 ??F (36.9 ??C)    Resp 20    Ht 6' (1.829 m)    Wt 187 lb (84.8 kg)    SpO2 99%    BMI 25.36 kg/m??   Oxygen Saturation Interpretation: Normal      ------------------------------------------ PROGRESS NOTES ------------------------------------------  7:24 PM EDT  I have spoken with the patient and discussed today???s results, in addition to providing specific details for the plan of care and counseling regarding the diagnosis and prognosis.  Their questions are answered at this time and they are agreeable with the plan. I discussed at length with them reasons for immediate return here for re evaluation. They will followup with PCP by calling their office tomorrow      --------------------------------- ADDITIONAL PROVIDER NOTES ---------------------------------  At this time the patient is without objective evidence of an acute process requiring hospitalization or  inpatient management.  They have remained hemodynamically stable throughout their entire ED visit and are stable for discharge with outpatient follow-up.     The plan has been discussed in detail and they are aware of the specific conditions for emergent return, as well as the importance of follow-up.      Discharge Medication List as of 10/31/2018  2:32 PM      START taking these medications    Details   predniSONE (DELTASONE) 20 MG tablet Take 3 tablets by mouth 2 times daily for 4 days, Disp-24 tablet,R-0Print             Diagnosis:  1. Moderate persistent asthma with acute exacerbation        Disposition:  Patient's disposition: Discharge to home  Patient's condition is stable.  Chucky Mayavid B Myan Locatelli, DO  Resident  10/31/18 [phone]

## 2018-11-01 LAB — EKG 12-LEAD
Atrial Rate: 105 {beats}/min
P Axis: 75 degrees
P-R Interval: 136 ms
Q-T Interval: 326 ms
QRS Duration: 86 ms
QTc Calculation (Bazett): 430 ms
R Axis: 5 degrees
T Axis: 53 degrees
Ventricular Rate: 105 {beats}/min

## 2018-11-01 NOTE — Care Coordination-Inpatient (Signed)
Date/Time:  11/01/2018 10:36 AM  Attempted to reach patient by telephone. Left HIPPA compliant message requesting a return call. Will attempt to reach patient again.

## 2018-11-02 NOTE — Care Coordination-Inpatient (Signed)
Date/Time:  11/02/2018 9:58 AM  Attempted to reach patient by telephone. Left HIPPA compliant message requesting a return call. Unable to complete initial call. Will resolve episode.

## 2018-11-04 ENCOUNTER — Inpatient Hospital Stay
Admit: 2018-11-04 | Discharge: 2018-11-04 | Disposition: A | Discharge status: Home / Self Care | Payer: PRIVATE HEALTH INSURANCE | Attending: Emergency Medicine

## 2018-11-04 ENCOUNTER — Emergency Department: Admit: 2018-11-04 | Payer: PRIVATE HEALTH INSURANCE

## 2018-11-04 DIAGNOSIS — J4542 Moderate persistent asthma with status asthmaticus: Secondary | ICD-10-CM

## 2018-11-04 DIAGNOSIS — J4541 Moderate persistent asthma with (acute) exacerbation: Secondary | ICD-10-CM

## 2018-11-04 MED ORDER — IPRATROPIUM-ALBUTEROL 0.5-2.5 (3) MG/3ML IN SOLN
RESPIRATORY_TRACT | 0 refills | Status: DC
Start: 2018-11-04 — End: 2018-11-10

## 2018-11-04 MED ORDER — SODIUM CHLORIDE 0.9 % IV BOLUS
0.9 | Freq: Once | INTRAVENOUS | Status: AC
Start: 2018-11-04 — End: 2018-11-04
  Administered 2018-11-04: 07:00:00 1000 mL via INTRAVENOUS

## 2018-11-04 MED ORDER — MAGNESIUM SULFATE 2000 MG/50 ML IVPB PREMIX
2 GM/50ML | Freq: Once | INTRAVENOUS | Status: AC
Start: 2018-11-04 — End: 2018-11-04
  Administered 2018-11-04: 07:00:00 2 g via INTRAVENOUS

## 2018-11-04 MED ORDER — METHYLPREDNISOLONE SODIUM SUCC 125 MG IJ SOLR
125 MG | Freq: Once | INTRAMUSCULAR | Status: AC
Start: 2018-11-04 — End: 2018-11-04
  Administered 2018-11-04: 07:00:00 125 mg via INTRAVENOUS

## 2018-11-04 MED ORDER — IPRATROPIUM-ALBUTEROL 0.5-2.5 (3) MG/3ML IN SOLN
Freq: Once | RESPIRATORY_TRACT | Status: AC
Start: 2018-11-04 — End: 2018-11-04
  Administered 2018-11-04: 07:00:00 3 via RESPIRATORY_TRACT

## 2018-11-04 MED ORDER — ADVAIR DISKUS 100-50 MCG/DOSE IN AEPB
100-50 MCG/DOSE | Freq: Two times a day (BID) | RESPIRATORY_TRACT | 2 refills | Status: DC
Start: 2018-11-04 — End: 2018-11-10

## 2018-11-04 MED ORDER — IPRATROPIUM-ALBUTEROL 0.5-2.5 (3) MG/3ML IN SOLN
Freq: Once | RESPIRATORY_TRACT | Status: AC
Start: 2018-11-04 — End: 2018-11-04
  Administered 2018-11-04: 08:00:00 1 via RESPIRATORY_TRACT

## 2018-11-04 MED FILL — SOLU-MEDROL 125 MG IJ SOLR: 125 MG | INTRAMUSCULAR | Qty: 125

## 2018-11-04 MED FILL — MAGNESIUM SULFATE 2 GM/50ML IV SOLN: 2 GM/50ML | INTRAVENOUS | Qty: 50

## 2018-11-04 MED FILL — IPRATROPIUM-ALBUTEROL 0.5-2.5 (3) MG/3ML IN SOLN: RESPIRATORY_TRACT | Qty: 3

## 2018-11-04 NOTE — Progress Notes (Signed)
This note also relates to the following rows which could not be included:  SpO2 - Cannot attach notes to unvalidated device data       11/04/18 0329   Treatment   Breath Sounds Post-Tx LUL Inspiratory Wheezes;Expiratory Wheezes  (improved aeration able to relax shoulders and speak fully)   Breath Sounds Post-Tx LLL Expiratory Wheezes;Inspiratory Wheezes   Breath Sounds Post-Tx RUL Expiratory Wheezes;Inspiratory Wheezes   Breath Sounds Post-Tx RML Expiratory Wheezes;Inspiratory Wheezes   Breath Sounds Post-Tx RLL Expiratory Wheezes;Inspiratory Wheezes   Post-Tx Pulse 110   Post-Tx Resps 16   Oxygen Therapy/Pulse Ox   Resp 20

## 2018-11-04 NOTE — ED Provider Notes (Signed)
HPI:  11/04/18,   Time: 3:01 AM EDT       Ray Rodgers is a 38 y.o. male presenting to the ED for shortness of breath, beginning 2 days ago.  The complaint has been persistent, moderate in severity, and worsened by nothing.  Patient states over the last 2 days he has been having shortness of breath.  He states that he saw his regular doctor and is placed on prednisone.  He states he has taken 1 dose of prednisone but overnight he woke up had coughing and worsening shortness of breath therefore he came to the ED to be evaluated.  Patient denies any fevers or chills.  Denies any recent illness.  No recent travel.  Otherwise no other acute symptoms.    Review of Systems:   Pertinent positives and negatives are stated within HPI, all other systems reviewed and are negative.          --------------------------------------------- PAST HISTORY ---------------------------------------------  Past Medical History:  has a past medical history of Asthma.    Past Surgical History:  has no past surgical history on file.    Social History:  reports that he quit smoking about 12 months ago. His smoking use included cigarettes. He has never used smokeless tobacco. He reports that he does not drink alcohol or use drugs.    Family History: family history is not on file.     The patient's home medications have been reviewed.    Allergies: Patient has no known allergies.        ---------------------------------------------------PHYSICAL EXAM--------------------------------------    Constitutional/General: Alert and oriented x3, well appearing, non toxic in NAD  Head: Normocephalic and atraumatic  Eyes: PERRL, EOMI, conjunctive normal, sclera non icteric  Mouth: Oropharynx clear, handling secretions, no trismus, no asymmetry of the posterior oropharynx or uvular edema  Neck: Supple, full ROM, non tender to palpation in the midline, no stridor, no crepitus, no meningeal signs  Respiratory: Diffuse bilateral wheezing, increased  respiratory effort, tachypnea, no crackles or rales.  Cardiovascular:  Regular rate. Regular rhythm. No murmurs, gallops, or rubs. 2+ distal pulses  Chest: No chest wall tenderness  GI:  Abdomen Soft, Non tender, Non distended.  +BS. No organomegaly, no palpable masses,  No rebound, guarding, or rigidity.   Musculoskeletal: Moves all extremities x 4. Warm and well perfused, no clubbing, cyanosis, or edema. Capillary refill <3 seconds  Integument: skin warm and dry. No rashes.   Lymphatic: no lymphadenopathy noted  Neurologic: GCS 15, no focal deficits, symmetric strength 5/5 in the upper and lower extremities bilaterally  Psychiatric: Normal Affect    -------------------------------------------------- RESULTS -------------------------------------------------  I have personally reviewed all laboratory and imaging results for this patient. Results are listed below.     LABS:  Results for orders placed or performed during the hospital encounter of 11/04/18   EKG 12 Lead   Result Value Ref Range    Ventricular Rate 116 BPM    Atrial Rate 116 BPM    P-R Interval 152 ms    QRS Duration 90 ms    Q-T Interval 318 ms    QTc Calculation (Bazett) 442 ms    P Axis 81 degrees    R Axis 25 degrees    T Axis 66 degrees       RADIOLOGY:  Interpreted by Radiologist.  XR CHEST PORTABLE   Final Result   COPD and unchanged  EKG:  This EKG is signed and interpreted by the EP.    EKG shows sinus tachycardia 116 bpm.  Biatrial enlargement.  No ST elevations or depression.  Nonspecific ST-T wave changes noted.  No STEMI.      ------------------------- NURSING NOTES AND VITALS REVIEWED ---------------------------   The nursing notes within the ED encounter and vital signs as below have been reviewed by myself.  BP 131/86   Pulse 98   Temp 98.3 F (36.8 C) (Oral)   Resp 25   Ht 6' (1.829 m)   Wt 187 lb (84.8 kg)   SpO2 96%   BMI 25.36 kg/m   Oxygen Saturation Interpretation: Normal    The patient's available past  medical records and past encounters were reviewed.        ------------------------------ ED COURSE/MEDICAL DECISION MAKING----------------------  Medications   0.9 % sodium chloride bolus (0 mLs Intravenous Stopped 11/04/18 0424)   ipratropium-albuterol (DUONEB) nebulizer solution 3 ampule (3 ampules Inhalation Given 11/04/18 0307)   methylPREDNISolone sodium (SOLU-MEDROL) injection 125 mg (125 mg Intravenous Given 11/04/18 0305)   magnesium sulfate 2 g in 50 mL IVPB premix (0 g Intravenous Stopped 11/04/18 0416)   ipratropium-albuterol (DUONEB) nebulizer solution 1 ampule (1 ampule Inhalation Given 11/04/18 0426)         ED COURSE:       Medical Decision Making:    This is a 38 year old male who presented to the ED for shortness of breath.  Upon arrival to the ED the patient did have bilateral wheezing and tachypnea concerning for asthma exacerbation.  Patient was given breathing treatments steroids and magnesium.  Patient's EKG showed no ischemic findings.  Chest x-ray shows no acute findings as well.  Upon reevaluation the patient symptoms have greatly improved.  Tachycardia resolved.  No hypoxia demonstrated.  Patient offered admission but using shared decision making the patient be discharged home with breathing treatments and continuation of steroids and close PCP follow-up.  Precautions given.  Patient agrees with plan.    I, Dr. Denita Lung, am the primary provider for this encounter    This patient's ED course included: a personal history and physicial examination, re-evaluation prior to disposition and multiple bedside re-evaluations    This patient has remained hemodynamically stable during their ED course.      Re-Evaluations:             Re-evaluation.  Patient's symptoms are improving      Counseling:   The emergency provider has spoken with the patient and discussed today's results, in addition to providing specific details for the plan of care and counseling regarding the diagnosis and prognosis.   Questions are answered at this time and they are agreeable with the plan.       --------------------------------- IMPRESSION AND DISPOSITION ---------------------------------    IMPRESSION  1. Moderate persistent asthma with exacerbation    2. Mild persistent asthma without complication        DISPOSITION  Disposition: Discharge to home  Patient condition is stable    NOTE: This report was transcribed using voice recognition software. Every effort was made to ensure accuracy; however, inadvertent computerized transcription errors may be present       Brooke Dare, DO  11/05/18 [phone]

## 2018-11-05 ENCOUNTER — Emergency Department: Admit: 2018-11-06 | Payer: PRIVATE HEALTH INSURANCE

## 2018-11-05 LAB — EKG 12-LEAD
Atrial Rate: 116 {beats}/min
P Axis: 81 degrees
P-R Interval: 152 ms
Q-T Interval: 318 ms
QRS Duration: 90 ms
QTc Calculation (Bazett): 442 ms
R Axis: 25 degrees
T Axis: 66 degrees
Ventricular Rate: 116 {beats}/min

## 2018-11-05 NOTE — ED Notes (Signed)
Faxed [facility] to 6th floor, patient being admitted to room [phone]. Eileen Stanford, verified they received it.      Eyvonne Left, RN  11/05/18 2325

## 2018-11-05 NOTE — Care Coordination-Inpatient (Signed)
ACM attempted to contact patient as a follow up to his ER visit on 11/04/18.   ACM left a HIPAA compliant voice message with contact information asking patient to return the call.     PLAN  Continue to attempt to contact patient

## 2018-11-05 NOTE — ED Notes (Signed)
Called respiratory for BT     Eyvonne Left, RN  11/05/18 2100

## 2018-11-05 NOTE — ED Provider Notes (Signed)
ED Attending  CC: No  HPI:  11/05/18, Time: 8:40 PM EDT         Ray Rodgers is a 38 y.o. male presenting to the ED for  SOB, beginning  3 Days ago.  The complaint has been persistent, moderate in severity, and worsened by light exertion.Patient comes in with complaint of shortness of breath.  He states he has a history of asthma and started with shortness of breath yesterday was seen here last evening was recommended for admission the patient felt that he could go home.  He states when he woke this morning he was having worsening shortness of breath difficulty with ambulating on discharge short distances without increased work of breathing.  States he has had a moist cough.  Which was productive only of the small amount thick white sputum earlier today.  He denies any fever chills.  Patient denies any leg pain or swelling.    Review of Systems:   Pertinent positives and negatives are stated within HPI, all other systems reviewed and are negative.          --------------------------------------------- PAST HISTORY ---------------------------------------------  Past Medical History:  has a past medical history of Asthma.    Past Surgical History:  has no past surgical history on file.    Social History:  reports that he quit smoking about 12 months ago. His smoking use included cigarettes. He has never used smokeless tobacco. He reports that he does not drink alcohol or use drugs.    Family History: family history is not on file.     The patient???s home medications have been reviewed.    Allergies: Patient has no known allergies.    -------------------------------------------------- RESULTS -------------------------------------------------  All laboratory and radiology results have been personally reviewed by myself   LABS:  Results for orders placed or performed during the hospital encounter of 11/05/18   CBC Auto Differential   Result Value Ref Range    WBC 8.4 4.5 - 11.5 E9/L    RBC 5.22 3.80 - 5.80 E12/L     Hemoglobin 15.0 12.5 - 16.5 g/dL    Hematocrit 44.3 37.0 - 54.0 %    MCV 84.9 80.0 - 99.9 fL    MCH 28.7 26.0 - 35.0 pg    MCHC 33.9 32.0 - 34.5 %    RDW 14.0 11.5 - 15.0 fL    Platelets 241 130 - 450 E9/L    MPV 10.5 7.0 - 12.0 fL    Neutrophils % 86.0 (H) 43.0 - 80.0 %    Immature Granulocytes % 0.5 0.0 - 5.0 %    Lymphocytes % 8.4 (L) 20.0 - 42.0 %    Monocytes % 4.9 2.0 - 12.0 %    Eosinophils % 0.1 0.0 - 6.0 %    Basophils % 0.1 0.0 - 2.0 %    Neutrophils Absolute 7.23 1.80 - 7.30 E9/L    Immature Granulocytes # 0.04 E9/L    Lymphocytes Absolute 0.71 (L) 1.50 - 4.00 E9/L    Monocytes Absolute 0.41 0.10 - 0.95 E9/L    Eosinophils Absolute 0.01 (L) 0.05 - 0.50 E9/L    Basophils Absolute 0.01 0.00 - 0.20 E9/L   Lactic Acid, Plasma   Result Value Ref Range    Lactic Acid 3.0 (H) 0.5 - 2.2 mmol/L   Brain Natriuretic Peptide   Result Value Ref Range    Pro-BNP 291 (H) 0 - 125 pg/mL   COVID-19   Result Value Ref Range  SARS-CoV-2, NAAT Not Detected Not Detected   SPECIMEN REJECTION   Result Value Ref Range    Rejected Test CMP, TROP     Reason for Rejection see below    Comprehensive metabolic panel   Result Value Ref Range    Sodium 139 132 - 146 mmol/L    Potassium 4.1 3.5 - 5.0 mmol/L    Chloride 102 98 - 107 mmol/L    CO2 26 22 - 29 mmol/L    Anion Gap 11 7 - 16 mmol/L    Glucose 101 (H) 74 - 99 mg/dL    BUN 15 6 - 20 mg/dL    CREATININE 1.1 0.7 - 1.2 mg/dL    GFR Non-African American >60 >=60 mL/min/1.73    GFR African American >60     Calcium 9.4 8.6 - 10.2 mg/dL    Total Protein 6.9 6.4 - 8.3 g/dL    Alb 4.3 3.5 - 5.2 g/dL    Total Bilirubin 0.4 0.0 - 1.2 mg/dL    Alkaline Phosphatase 106 40 - 129 U/L    ALT 30 0 - 40 U/L    AST 23 0 - 39 U/L   Troponin   Result Value Ref Range    Troponin <0.01 0.00 - 0.03 ng/mL       RADIOLOGY:  Interpreted by Radiologist.  XR CHEST (2 VW)   Final Result      Negative two-view chest.          ------------------------- NURSING NOTES AND VITALS REVIEWED  ---------------------------   The nursing notes within the ED encounter and vital signs as below have been reviewed.   BP 124/78    Pulse 94    Temp 98.5 ??F (36.9 ??C) (Oral)    Resp 18    Ht 6' (1.829 m)    Wt 190 lb (86.2 kg)    SpO2 95%    BMI 25.77 kg/m??   Oxygen Saturation Interpretation: Normal      ---------------------------------------------------PHYSICAL EXAM--------------------------------------      Constitutional/General: Alert and oriented x3,   Head: Normocephalic and atraumatic  Eyes: PERRL, EOMI  Mouth: Oropharynx clear, handling secretions, no trismus  Neck: Supple, full ROM,   Pulmonary: Lungs bilateral expiratory wheezes with decreased aeration.  Mild increased work of breathing.  Cardiovascular:  Regular rate and rhythm, no murmurs, gallops, or rubs. 2+ distal pulses  Abdomen: Soft, non tender, non distended,   Extremities: Moves all extremities x 4. Warm and well perfused no swelling of the lower extremities no erythema.  Skin: warm and dry without rash  Neurologic: GCS 15,  Psych: Normal Affect      ------------------------------ ED COURSE/MEDICAL DECISION MAKING----------------------  Medications   magnesium sulfate 2 g in 50 mL IVPB premix (2 g Intravenous New Bag 11/05/18 2252)   sodium chloride flush 0.9 % injection 10 mL (10 mLs Intravenous Not Given 11/06/18 0018)   sodium chloride flush 0.9 % injection 10 mL (has no administration in time range)   acetaminophen (TYLENOL) tablet 650 mg (has no administration in time range)     Or   acetaminophen (TYLENOL) suppository 650 mg (has no administration in time range)   polyethylene glycol (GLYCOLAX) packet 17 g (has no administration in time range)   promethazine (PHENERGAN) tablet 12.5 mg (has no administration in time range)     Or   ondansetron (ZOFRAN) injection 4 mg (has no administration in time range)   enoxaparin (LOVENOX) injection 40 mg (has no administration in time  range)   ipratropium-albuterol (DUONEB) nebulizer solution 1 ampule (1  ampule Inhalation Given 11/05/18 2139)   methylPREDNISolone sodium (SOLU-MEDROL) injection 125 mg (125 mg Intravenous Given 11/05/18 2105)       EKG # 1  Interpreted by emergency department physician unless otherwise noted.  Time:  2242  Rate: 96  Rhythm: Sinus.  Interpretation: normal sinus rhythm.  2205 breath  sounds with improved aeration patient does continue with expiratory wheezes laterally.  2300 discussed with Dr. Marlane HatcherHric     Medical Decision Making:    Patient came in with complaint of shortness of breath over the last 3 days.  He has a history of asthma.  He was here last night and treated with Solu-Medrol and discharged with a prednisone burst albuterol aerosols at home were not helping the patient he had progressive worsening shortness of breath.  Patient states he was having increased work of breathing with minimal exertion.  Will admit for observation for asthma extubation.  Counseling:   The emergency provider has spoken with the patient and discussed today???s results, in addition to providing specific details for the plan of care and counseling regarding the diagnosis and prognosis.  Questions are answered at this time and they are agreeable with the plan.      --------------------------------- IMPRESSION AND DISPOSITION ---------------------------------    IMPRESSION  1. Moderate persistent asthma with acute exacerbation        DISPOSITION  Disposition: Admit to med/surg floor  Patient condition is fair      NOTE: This report was transcribed using voice recognition software. Every effort was made to ensure accuracy; however, inadvertent computerized transcription errors may be present     Griffin BasilRenee M Mykalah Saari, PA  11/06/18 0040       Griffin Basilenee M Roseanne Juenger, PA  11/06/18 [phone]

## 2018-11-06 ENCOUNTER — Inpatient Hospital Stay
Admission: EM | Admit: 2018-11-06 | Discharge: 2018-11-13 | Disposition: A | Discharge status: Home / Self Care | Payer: PRIVATE HEALTH INSURANCE | Source: Other Acute Inpatient Hospital | Admitting: Internal Medicine

## 2018-11-06 LAB — SPECIMEN REJECTION

## 2018-11-06 LAB — COMPREHENSIVE METABOLIC PANEL
ALT: 30 U/L (ref 0–40)
AST: 23 U/L (ref 0–39)
Albumin: 4.3 g/dL (ref 3.5–5.2)
Alkaline Phosphatase: 106 U/L (ref 40–129)
Anion Gap: 11 mmol/L (ref 7–16)
BUN: 15 mg/dL (ref 6–20)
CO2: 26 mmol/L (ref 22–29)
Calcium: 9.4 mg/dL (ref 8.6–10.2)
Chloride: 102 mmol/L (ref 98–107)
Creatinine: 1.1 mg/dL (ref 0.7–1.2)
GFR African American: >60
GFR Non-African American: >60 mL/min/{1.73_m2} (ref >=60)
Glucose: 101 mg/dL — ABNORMAL HIGH (ref 74–99)
Potassium: 4.1 mmol/L (ref 3.5–5.0)
Sodium: 139 mmol/L (ref 132–146)
Total Bilirubin: 0.4 mg/dL (ref 0.0–1.2)
Total Protein: 6.9 g/dL (ref 6.4–8.3)

## 2018-11-06 LAB — URINALYSIS
Bilirubin Urine: NEGATIVE
Blood, Urine: NEGATIVE
Glucose, Ur: NEGATIVE mg/dL
Ketones, Urine: NEGATIVE mg/dL
Leukocyte Esterase, Urine: NEGATIVE
Nitrite, Urine: NEGATIVE
Protein, UA: NEGATIVE mg/dL
Specific Gravity, UA: 1.02 (ref 1.005–1.030)
Urobilinogen, Urine: 0.2 E.U./dL (ref <2.0)
pH, UA: 7 (ref 5.0–9.0)

## 2018-11-06 LAB — CBC WITH AUTO DIFFERENTIAL
Basophils %: 0.1 % (ref 0.0–2.0)
Basophils %: 0.1 % (ref 0.0–2.0)
Basophils Absolute: 0.01 E9/L (ref 0.00–0.20)
Basophils Absolute: 0.01 E9/L (ref 0.00–0.20)
Eosinophils %: 0.1 % (ref 0.0–6.0)
Eosinophils %: 0 % (ref 0.0–6.0)
Eosinophils Absolute: 0.01 E9/L — ABNORMAL LOW (ref 0.05–0.50)
Eosinophils Absolute: 0 E9/L — ABNORMAL LOW (ref 0.05–0.50)
Hematocrit: 44.3 % (ref 37.0–54.0)
Hematocrit: 44.3 % (ref 37.0–54.0)
Hemoglobin: 15 g/dL (ref 12.5–16.5)
Hemoglobin: 14.9 g/dL (ref 12.5–16.5)
Immature Granulocytes #: 0.04 E9/L
Immature Granulocytes #: 0.03 E9/L
Immature Granulocytes %: 0.5 % (ref 0.0–5.0)
Immature Granulocytes %: 0.4 % (ref 0.0–5.0)
Lymphocytes %: 8.4 % — ABNORMAL LOW (ref 20.0–42.0)
Lymphocytes %: 6.4 % — ABNORMAL LOW (ref 20.0–42.0)
Lymphocytes Absolute: 0.71 E9/L — ABNORMAL LOW (ref 1.50–4.00)
Lymphocytes Absolute: 0.51 E9/L — ABNORMAL LOW (ref 1.50–4.00)
MCH: 28.7 pg (ref 26.0–35.0)
MCH: 28.9 pg (ref 26.0–35.0)
MCHC: 33.9 % (ref 32.0–34.5)
MCHC: 33.6 % (ref 32.0–34.5)
MCV: 84.9 fL (ref 80.0–99.9)
MCV: 86 fL (ref 80.0–99.9)
MPV: 10.5 fL (ref 7.0–12.0)
MPV: 10.7 fL (ref 7.0–12.0)
Monocytes %: 4.9 % (ref 2.0–12.0)
Monocytes %: 1 % — ABNORMAL LOW (ref 2.0–12.0)
Monocytes Absolute: 0.41 E9/L (ref 0.10–0.95)
Monocytes Absolute: 0.08 E9/L — ABNORMAL LOW (ref 0.10–0.95)
Neutrophils %: 86 % — ABNORMAL HIGH (ref 43.0–80.0)
Neutrophils %: 92.1 % — ABNORMAL HIGH (ref 43.0–80.0)
Neutrophils Absolute: 7.23 E9/L (ref 1.80–7.30)
Neutrophils Absolute: 7.33 E9/L — ABNORMAL HIGH (ref 1.80–7.30)
Platelets: 241 E9/L (ref 130–450)
Platelets: 241 E9/L (ref 130–450)
RBC Morphology: NORMAL
RBC: 5.22 E12/L (ref 3.80–5.80)
RBC: 5.15 E12/L (ref 3.80–5.80)
RDW: 14 fL (ref 11.5–15.0)
RDW: 14 fL (ref 11.5–15.0)
WBC: 8.4 E9/L (ref 4.5–11.5)
WBC: 8 E9/L (ref 4.5–11.5)

## 2018-11-06 LAB — COVID-19: SARS-CoV-2, NAAT: NOT DETECTED

## 2018-11-06 LAB — EKG 12-LEAD
Atrial Rate: 96 {beats}/min
P Axis: 70 degrees
P-R Interval: 142 ms
Q-T Interval: 348 ms
QRS Duration: 90 ms
QTc Calculation (Bazett): 439 ms
R Axis: 29 degrees
T Axis: 53 degrees
Ventricular Rate: 96 {beats}/min

## 2018-11-06 LAB — COMPREHENSIVE METABOLIC PANEL W/ REFLEX TO MG FOR LOW K
ALT: 32 U/L (ref 0–40)
AST: 22 U/L (ref 0–39)
Albumin: 4.1 g/dL (ref 3.5–5.2)
Alkaline Phosphatase: 107 U/L (ref 40–129)
Anion Gap: 13 mmol/L (ref 7–16)
BUN: 16 mg/dL (ref 6–20)
CO2: 26 mmol/L (ref 22–29)
Calcium: 9.1 mg/dL (ref 8.6–10.2)
Chloride: 100 mmol/L (ref 98–107)
Creatinine: 1.1 mg/dL (ref 0.7–1.2)
GFR African American: >60
GFR Non-African American: >60 mL/min/{1.73_m2} (ref >=60)
Glucose: 200 mg/dL — ABNORMAL HIGH (ref 74–99)
Potassium reflex Magnesium: 4.5 mmol/L (ref 3.5–5.0)
Sodium: 139 mmol/L (ref 132–146)
Total Bilirubin: 0.3 mg/dL (ref 0.0–1.2)
Total Protein: 6.9 g/dL (ref 6.4–8.3)

## 2018-11-06 LAB — TROPONIN: Troponin: <0.01 ng/mL (ref 0.00–0.03)

## 2018-11-06 LAB — BRAIN NATRIURETIC PEPTIDE: Pro-BNP: 291 pg/mL — ABNORMAL HIGH (ref 0–125)

## 2018-11-06 LAB — LACTIC ACID: Lactic Acid: 3 mmol/L — ABNORMAL HIGH (ref 0.5–2.2)

## 2018-11-06 MED ORDER — METHYLPREDNISOLONE SODIUM SUCC 40 MG IJ SOLR
40 MG | Freq: Four times a day (QID) | INTRAMUSCULAR | Status: DC
Start: 2018-11-06 — End: 2018-11-13
  Administered 2018-11-06 – 2018-11-13 (×30): 40 mg via INTRAVENOUS

## 2018-11-06 MED ORDER — IPRATROPIUM-ALBUTEROL 0.5-2.5 (3) MG/3ML IN SOLN
RESPIRATORY_TRACT | Status: DC
Start: 2018-11-06 — End: 2018-11-06

## 2018-11-06 MED ORDER — ACETAMINOPHEN 650 MG RE SUPP
650 MG | Freq: Four times a day (QID) | RECTAL | Status: DC | PRN
Start: 2018-11-06 — End: 2018-11-05

## 2018-11-06 MED ORDER — BUDESONIDE 0.25 MG/2ML IN SUSP
0.25 MG/2ML | Freq: Two times a day (BID) | RESPIRATORY_TRACT | Status: DC
Start: 2018-11-06 — End: 2018-11-11
  Administered 2018-11-06 – 2018-11-11 (×11): 250 ug via RESPIRATORY_TRACT

## 2018-11-06 MED ORDER — ARFORMOTEROL TARTRATE 15 MCG/2ML IN NEBU
15 MCG/2ML | Freq: Two times a day (BID) | RESPIRATORY_TRACT | Status: DC
Start: 2018-11-06 — End: 2018-11-13
  Administered 2018-11-06 – 2018-11-13 (×15): 15 ug via RESPIRATORY_TRACT

## 2018-11-06 MED ORDER — SODIUM CHLORIDE 0.9 % IV SOLN
0.9 % | INTRAVENOUS | Status: DC
Start: 2018-11-06 — End: 2018-11-11
  Administered 2018-11-06: 18:00:00 via INTRAVENOUS

## 2018-11-06 MED ORDER — ACETAMINOPHEN 325 MG PO TABS
325 MG | Freq: Four times a day (QID) | ORAL | Status: DC | PRN
Start: 2018-11-06 — End: 2018-11-13
  Administered 2018-11-07: 650 mg via ORAL

## 2018-11-06 MED ORDER — IPRATROPIUM-ALBUTEROL 0.5-2.5 (3) MG/3ML IN SOLN
RESPIRATORY_TRACT | Status: AC
Start: 2018-11-06 — End: 2018-11-05
  Administered 2018-11-06 (×3): 1 via RESPIRATORY_TRACT

## 2018-11-06 MED ORDER — ACETAMINOPHEN 650 MG RE SUPP
650 | Freq: Four times a day (QID) | RECTAL | Status: DC | PRN
Start: 2018-11-06 — End: 2018-11-06

## 2018-11-06 MED ORDER — ACETAMINOPHEN 650 MG RE SUPP
650 MG | Freq: Four times a day (QID) | RECTAL | Status: DC | PRN
Start: 2018-11-06 — End: 2018-11-13

## 2018-11-06 MED ORDER — ONDANSETRON HCL 4 MG/2ML IJ SOLN
4 | Freq: Four times a day (QID) | INTRAMUSCULAR | Status: DC | PRN
Start: 2018-11-06 — End: 2018-11-05

## 2018-11-06 MED ORDER — GUAIFENESIN 400 MG PO TABS
400 MG | Freq: Four times a day (QID) | ORAL | Status: DC | PRN
Start: 2018-11-06 — End: 2018-11-13
  Administered 2018-11-07 – 2018-11-13 (×10): 400 mg via ORAL

## 2018-11-06 MED ORDER — NORMAL SALINE FLUSH 0.9 % IV SOLN
0.9 % | Freq: Two times a day (BID) | INTRAVENOUS | Status: DC
Start: 2018-11-06 — End: 2018-11-05

## 2018-11-06 MED ORDER — IPRATROPIUM BROMIDE 0.02 % IN SOLN
0.02 % | Freq: Four times a day (QID) | RESPIRATORY_TRACT | Status: DC
Start: 2018-11-06 — End: 2018-11-11
  Administered 2018-11-06 – 2018-11-11 (×16): 0.5 mg via RESPIRATORY_TRACT

## 2018-11-06 MED ORDER — ENOXAPARIN SODIUM 40 MG/0.4ML SC SOLN
40 | Freq: Every day | SUBCUTANEOUS | Status: DC
Start: 2018-11-06 — End: 2018-11-13
  Administered 2018-11-06 – 2018-11-12 (×7): 40 mg via SUBCUTANEOUS

## 2018-11-06 MED ORDER — ACETAMINOPHEN 325 MG PO TABS
325 MG | Freq: Four times a day (QID) | ORAL | Status: DC | PRN
Start: 2018-11-06 — End: 2018-11-06

## 2018-11-06 MED ORDER — POLYETHYLENE GLYCOL 3350 17 G PO PACK
17 g | Freq: Every day | ORAL | Status: DC | PRN
Start: 2018-11-06 — End: 2018-11-06

## 2018-11-06 MED ORDER — NORMAL SALINE FLUSH 0.9 % IV SOLN
0.9 | INTRAVENOUS | Status: DC | PRN
Start: 2018-11-06 — End: 2018-11-05

## 2018-11-06 MED ORDER — METHYLPREDNISOLONE SODIUM SUCC 125 MG IJ SOLR
125 MG | Freq: Once | INTRAMUSCULAR | Status: AC
Start: 2018-11-06 — End: 2018-11-05
  Administered 2018-11-06: 01:00:00 125 mg via INTRAVENOUS

## 2018-11-06 MED ORDER — PROMETHAZINE HCL 25 MG PO TABS
25 | Freq: Four times a day (QID) | ORAL | Status: DC | PRN
Start: 2018-11-06 — End: 2018-11-06

## 2018-11-06 MED ORDER — POLYETHYLENE GLYCOL 3350 17 G PO PACK
17 g | Freq: Every day | ORAL | Status: DC | PRN
Start: 2018-11-06 — End: 2018-11-13

## 2018-11-06 MED ORDER — ENOXAPARIN SODIUM 40 MG/0.4ML SC SOLN
400.4 MG/0.4ML | Freq: Every day | SUBCUTANEOUS | Status: DC
Start: 2018-11-06 — End: 2018-11-06

## 2018-11-06 MED ORDER — ACETAMINOPHEN 325 MG PO TABS
325 MG | Freq: Four times a day (QID) | ORAL | Status: DC | PRN
Start: 2018-11-06 — End: 2018-11-05

## 2018-11-06 MED ORDER — NORMAL SALINE FLUSH 0.9 % IV SOLN
0.9 | Freq: Two times a day (BID) | INTRAVENOUS | Status: DC
Start: 2018-11-06 — End: 2018-11-06

## 2018-11-06 MED ORDER — ONDANSETRON HCL 4 MG/2ML IJ SOLN
4 MG/2ML | Freq: Four times a day (QID) | INTRAMUSCULAR | Status: DC | PRN
Start: 2018-11-06 — End: 2018-11-13

## 2018-11-06 MED ORDER — PROMETHAZINE HCL 25 MG PO TABS
25 MG | Freq: Four times a day (QID) | ORAL | Status: DC | PRN
Start: 2018-11-06 — End: 2018-11-05

## 2018-11-06 MED ORDER — POLYETHYLENE GLYCOL 3350 17 G PO PACK
17 g | Freq: Every day | ORAL | Status: DC | PRN
Start: 2018-11-06 — End: 2018-11-05

## 2018-11-06 MED ORDER — ENOXAPARIN SODIUM 40 MG/0.4ML SC SOLN
40 | Freq: Every day | SUBCUTANEOUS | Status: DC
Start: 2018-11-06 — End: 2018-11-05

## 2018-11-06 MED ORDER — PROMETHAZINE HCL 25 MG PO TABS
25 MG | Freq: Four times a day (QID) | ORAL | Status: DC | PRN
Start: 2018-11-06 — End: 2018-11-13

## 2018-11-06 MED ORDER — NORMAL SALINE FLUSH 0.9 % IV SOLN
0.9 % | INTRAVENOUS | Status: DC | PRN
Start: 2018-11-06 — End: 2018-11-06

## 2018-11-06 MED ORDER — MAGNESIUM SULFATE 2000 MG/50 ML IVPB PREMIX
2 GM/50ML | Freq: Once | INTRAVENOUS | Status: AC
Start: 2018-11-06 — End: 2018-11-06
  Administered 2018-11-06: 03:00:00 2 g via INTRAVENOUS

## 2018-11-06 MED ORDER — ONDANSETRON HCL 4 MG/2ML IJ SOLN
4 MG/2ML | Freq: Four times a day (QID) | INTRAMUSCULAR | Status: DC | PRN
Start: 2018-11-06 — End: 2018-11-06

## 2018-11-06 MED ORDER — ALBUTEROL SULFATE 0.63 MG/3ML IN NEBU
0.63 MG/3ML | RESPIRATORY_TRACT | Status: DC | PRN
Start: 2018-11-06 — End: 2018-11-11
  Administered 2018-11-06 – 2018-11-10 (×9): 0.63 via RESPIRATORY_TRACT

## 2018-11-06 MED ORDER — NORMAL SALINE FLUSH 0.9 % IV SOLN
0.9 | INTRAVENOUS | Status: DC | PRN
Start: 2018-11-06 — End: 2018-11-13
  Administered 2018-11-12: 07:00:00 10 mL via INTRAVENOUS

## 2018-11-06 MED ORDER — BUDESONIDE-FORMOTEROL FUMARATE 80-4.5 MCG/ACT IN AERO
Freq: Two times a day (BID) | RESPIRATORY_TRACT | Status: DC
Start: 2018-11-06 — End: 2018-11-06

## 2018-11-06 MED ORDER — NORMAL SALINE FLUSH 0.9 % IV SOLN
0.9 | Freq: Two times a day (BID) | INTRAVENOUS | Status: DC
Start: 2018-11-06 — End: 2018-11-13
  Administered 2018-11-06 – 2018-11-13 (×14): 10 mL via INTRAVENOUS

## 2018-11-06 MED FILL — ENOXAPARIN SODIUM 40 MG/0.4ML SC SOLN: 40 MG/0.4ML | SUBCUTANEOUS | Qty: 0.4

## 2018-11-06 MED FILL — METHYLPREDNISOLONE SODIUM SUCC 40 MG IJ SOLR: 40 MG | INTRAMUSCULAR | Qty: 40

## 2018-11-06 MED FILL — NORMAL SALINE FLUSH 0.9 % IV SOLN: 0.9 % | INTRAVENOUS | Qty: 10

## 2018-11-06 MED FILL — MAGNESIUM SULFATE 2 GM/50ML IV SOLN: 2 GM/50ML | INTRAVENOUS | Qty: 50

## 2018-11-06 MED FILL — SOLU-MEDROL 125 MG IJ SOLR: 125 MG | INTRAMUSCULAR | Qty: 125

## 2018-11-06 MED FILL — ALBUTEROL SULFATE 0.63 MG/3ML IN NEBU: 0.63 MG/3ML | RESPIRATORY_TRACT | Qty: 3

## 2018-11-06 MED FILL — POLYETHYLENE GLYCOL 3350 17 G PO PACK: 17 g | ORAL | Qty: 1

## 2018-11-06 MED FILL — IPRATROPIUM BROMIDE 0.02 % IN SOLN: 0.02 % | RESPIRATORY_TRACT | Qty: 2.5

## 2018-11-06 MED FILL — BUDESONIDE 0.25 MG/2ML IN SUSP: 0.25 MG/2ML | RESPIRATORY_TRACT | Qty: 2

## 2018-11-06 MED FILL — BROVANA 15 MCG/2ML IN NEBU: 15 MCG/2ML | RESPIRATORY_TRACT | Qty: 2

## 2018-11-06 NOTE — Progress Notes (Signed)
Patient was showering in bathroom and called for assistance and was found to be short of breath and oxygen saturation of 85% on 3LNC. Patient assisted back to bed, O2 was increased to 6L and patient saturation came up to 95%. Will continue to monitor.

## 2018-11-06 NOTE — H&P (Signed)
[facility]   History and Physical      CHIEF COMPLAINT:  Shortness of breath    History of Present Illness: pt is a 38 y.o. male who presents to hospital for shortness of breath. Pt was asymptomatic up until approx 3 days ago. Pt notes he was having progressively worsening sob. Sob was at rest and with ambulation. Pt was seen in ER the day before and was recommended to be admitted to hospital but wanted to go home. However, pt did not improve and represents to ER for further eval and tx. Pt notes cough with minimal sputum. Pt denies fevers, chills, chest pain, abd pain, n/v, diarrhea, constipation, melena, hematochezia, change in urination, dysuria, or hematuria.     REVIEW OF SYSTEMS:    A detailed system review was conducted with patient and is negative unless stated in hpi.        PMH:  Past Medical History:   Diagnosis Date   ??? Asthma        Surgical History:  History reviewed. No pertinent surgical history.    Medications Prior to Admission:    Prior to Admission medications    Medication Sig Start Date End Date Taking? Authorizing Provider   ipratropium-albuterol (DUONEB) 0.5-2.5 (3) MG/3ML SOLN nebulizer solution Inhale 3 mLs into the lungs every 4 hours 11/04/18   Vergie Living, DO   ADVAIR DISKUS 100-50 MCG/DOSE diskus inhaler Inhale 1 puff into the lungs every 12 hours 11/04/18   Vergie Living, DO   albuterol sulfate HFA (PROAIR HFA) 108 (90 Base) MCG/ACT inhaler Inhale 2 puffs into the lungs every 6 hours as needed for Wheezing 08/31/18   Alanson Puls, PA-C   Respiratory Therapy Supplies (NEBULIZER/TUBING/MOUTHPIECE) KIT 1 kit by Does not apply route daily as needed (asthma) 08/31/18   Alanson Puls, PA-C   albuterol sulfate HFA 108 (90 Base) MCG/ACT inhaler Inhale 2 puffs into the lungs every 6 hours as needed for Wheezing or Shortness of Breath 02/21/18   Retta Mac, APRN - CNP   albuterol (ACCUNEB) 0.63 MG/3ML nebulizer solution Take 1 ampule by  nebulization every 6 hours as needed for Wheezing    Historical Provider, MD       Allergies:    Patient has no known allergies.    Social History:    reports that he quit smoking about 12 months ago. His smoking use included cigarettes. He has never used smokeless tobacco. He reports that he does not drink alcohol or use drugs.    Family History:   History reviewed. No pertinent family history. pt did not recall any medical problems for parents when asked          PHYSICAL EXAM:    Vitals:  BP 121/74    Pulse 94    Temp 97.8 ??F (36.6 ??C) (Oral)    Resp 18    Ht 6' (1.829 m)    Wt 190 lb (86.2 kg)    SpO2 95%    BMI 25.77 kg/m??     General:  Appears comfortable. Answers questions appropriately and cooperative with exam  HEENT:  Mucous membranes moist. No erythema, rhinorrhea, or post-nasal drip noted.  Neck:  No carotid bruits.  Heart:  Rhythm regular at rate of 96  Lungs:  Pos wheeze. Decreased respiratory motion  Abdomen:  Positive bowel sounds positive.  Soft.  Non-tender. No guarding, rebound or rigidity.  Breast/Rectal/Genitourinary: not pertinent.    Extremities:  Negative for lower  extremity edema  Skin:  Warm and dry  Vascular: 2/4 Dorsalis Pedis pulses bilaterally.  Neuro:  Cranial nerves 2-12 grossly intact, no focal weakness or change in sensation noted.  Extraocular muscles intact.  Pupils equal, round, reactive to light.                LABS:  Recent Labs     11/05/18  2144 11/06/18  0320   NA 139 139   K 4.1 4.5   CL 102 100   CO2 26 26   BUN 15 16   CREATININE 1.1 1.1   GLUCOSE 101* 200*   CALCIUM 9.4 9.1       Recent Labs     11/05/18  2053 11/06/18  0320   WBC 8.4 8.0   RBC 5.22 5.15   HGB 15.0 14.9   HCT 44.3 44.3   MCV 84.9 86.0   MCH 28.7 28.9   MCHC 33.9 33.6   RDW 14.0 14.0   PLT 241 241   MPV 10.5 10.7       No results for input(s): POCGLU in the last 72 hours.    CBC with Differential:    Lab Results   Component Value Date    WBC 8.0 11/06/2018    RBC 5.15 11/06/2018    HGB 14.9 11/06/2018     HCT 44.3 11/06/2018    PLT 241 11/06/2018    MCV 86.0 11/06/2018    MCH 28.9 11/06/2018    MCHC 33.6 11/06/2018    RDW 14.0 11/06/2018    LYMPHOPCT 6.4 11/06/2018    MONOPCT 1.0 11/06/2018    BASOPCT 0.1 11/06/2018    MONOSABS 0.08 11/06/2018    LYMPHSABS 0.51 11/06/2018    EOSABS 0.00 11/06/2018    BASOSABS 0.01 11/06/2018     CMP:    Lab Results   Component Value Date    NA 139 11/06/2018    K 4.5 11/06/2018    CL 100 11/06/2018    CO2 26 11/06/2018    BUN 16 11/06/2018    CREATININE 1.1 11/06/2018    GFRAA >60 11/06/2018    LABGLOM >60 11/06/2018    GLUCOSE 200 11/06/2018    PROT 6.9 11/06/2018    LABALBU 4.1 11/06/2018    CALCIUM 9.1 11/06/2018    BILITOT 0.3 11/06/2018    ALKPHOS 107 11/06/2018    AST 22 11/06/2018    ALT 32 11/06/2018     WBC  8.4  4.5 - 11.5 E9/L  Final  11/05/2018 ??8:53 PM  [facility]    RBC  5.22  3.80 - 5.80 E12/L  Final  11/05/2018 ??8:53 PM  [facility]    Hemoglobin  15.0  12.5 - 16.5 g/dL  Final  11/05/2018 ??8:53 PM  [facility]    Hematocrit  44.3  37.0 - 54.0 %  Final  11/05/2018 ??8:53 PM  [facility]    MCV  84.9  80.0 - 99.9 fL  Final  11/05/2018 ??8:53 PM  [facility]    [facility]  28.7  26.0 - 35.0 pg  Final  11/05/2018 ??8:53 PM  [facility]    MCHC  33.9  32.0 - 34.5 %  Final  11/05/2018 ??8:53 PM  [facility]    RDW  14.0  11.5 - 15.0 fL  Final  11/05/2018 ??8:53 PM  [facility] [address]    Platelets  [id number] E9/L  Final  11/05/2018 ??8:53 PM  [facility]    MPV  10.5  7.0 - 12.0 fL  Final  11/05/2018 ??8:53 PM  [facility]    Neutrophils %  86.0High    43.0 - 80.0 %  Final  11/05/2018 ??8:53 PM  [facility]    Immature Granulocytes %  0.5  0.0 - 5.0 %  Final  11/05/2018 ??8:53 PM  [facility]    Lymphocytes %  8.4Low    20.0 - 42.0 %  Final  11/05/2018  ??8:53 PM  [facility]    Monocytes %  4.9  2.0 - 12.0 %  Final  11/05/2018 ??8:53 PM  [facility]    Eosinophils %  0.1  0.0 - 6.0 %  Final  11/05/2018 ??8:53 PM  [facility]    Basophils %  0.1  0.0 - 2.0 %  Final  11/05/2018 ??8:53 PM  [facility]    Neutrophils Absolute  7.23  1.80 - 7.30 E9/L  Final  11/05/2018 ??8:53 PM  [facility]    Immature Granulocytes #  0.04  E9/L  Final  11/05/2018 ??8:53 PM  [facility]    Lymphocytes Absolute  0.71Low    1.50 - 4.00 E9/L  Final  11/05/2018 ??8:53 PM  [facility]    Monocytes Absolute  0.41  0.10 - 0.95 E9/L  Final  11/05/2018 ??8:53 PM  [facility]    Eosinophils Absolute  0.01Low    0.05 - 0.50 E9/L  Final  11/05/2018 ??8:53 PM  [facility]    Basophils Absolute  0.01  0.00 - 0.20 E9/L  Final  11/05/2018 ??8:53 PM  [facility]      Sodium  139  132 - 146 mmol/L  Final  11/05/2018 ??9:44 PM  [facility]    Potassium  4.1  3.5 - 5.0 mmol/L  Final  11/05/2018 ??9:44 PM  [facility]    Chloride  102  98 - 107 mmol/L  Final  11/05/2018 ??9:44 PM  [facility]    CO2  26  22 - 29 mmol/L  Final  11/05/2018 ??9:44 PM  [facility]    Anion Gap  11  7 - 16 mmol/L  Final  11/05/2018 ??9:44 PM  [facility]    Glucose  101High    74 - 99 mg/dL  Final  11/05/2018 ??9:44 PM  [facility]    BUN  15  6 - 20 mg/dL  Final  11/05/2018 ??9:44 PM  [facility]    CREATININE  1.1  0.7 - 1.2 mg/dL  Final  11/05/2018 ??9:44 PM  [facility]    GFR Non-African American  >60  >=60 mL/min/1.73  Final  11/05/2018 ??9:44 PM  [facility]    Chronic Kidney Disease: less than 60 ml/min/1.73 sq.m.   ?? ?? ?? ??  Kidney Failure: less than 15 ml/min/1.73  sq.m.   Results valid for patients 18 years and older.    GFR African American  >60   Final  11/05/2018 ??9:44 PM  [facility]    Calcium  9.4  8.6 - 10.2 mg/dL  Final  11/05/2018 ??9:44 PM  [facility]    Total Protein  6.9  6.4 - 8.3 g/dL  Final  11/05/2018 ??9:44 PM  [facility]    Alb  4.3  3.5 - 5.2 g/dL  Final  11/05/2018 ??9:44 PM  [facility]    Total Bilirubin  0.4  0.0 - 1.2 mg/dL  Final  11/05/2018 ??9:44 PM  [facility]    Alkaline Phosphatase  106  40 - 129 U/L  Final  11/05/2018 ??9:44 PM  [facility]    ALT  30  0 - 40 U/L  Final  11/05/2018 ??9:44 PM  [facility]    AST  23  0 - 39 U/L  Final  11/05/2018 ??9:44 PM  [facility]      Lactic Acid  3.0High             Radiology: Xr Chest (2 Vw)    Result Date: 11/05/2018  Clinical indications: Cough. Chest pain. TECHNIQUE: Frontal and lateral radiographs (2 views) of the chest are obtained. COMPARISON: None FINDINGS: Lungs are negative for evidence of acute airspace consolidation, effusion, atelectasis, pneumothorax, pathologic nodularity or interstitial thickening. Mild hyperinflation. The heart is not enlarged. Mediastinum is midline. There is no evidence for hilar lymphadenopathy. Skeletal structures are negative for evidence of aggressive process or acute fracture.     Negative two-view chest.            ASSESSMENT:      Active Problems:    Asthma exacerbation attacks  Resolved Problems:    * No resolved hospital problems. *      PLAN:    1. Acute exacerbation of asthma  Continue steroids and nebs. Will continue oxygen for now and will reassess. Pt noted to have o2 sat of 90%  2. Elevated lactic acid level monitor  3. Dehydration due to insensible losses with tachycardia on presentation will give iv fluids. Pt may have an osmotic diuresis with the hyperglycemia. Will check ua.   4. Hyperglycemia likely  due to steroids monitor            Electronically signed by Michell Heinrich Shep Porter, DO on 11/06/2018 at 11:41 AM

## 2018-11-06 NOTE — Plan of Care (Signed)
Problem: Activity Intolerance:  Goal: Ability to perform activities of daily living will improve  Description: Ability to perform activities of daily living will improve  Outcome: Met This Shift     Problem: Airway Clearance - Ineffective:  Goal: Ability to maintain a clear airway will improve  Description: Ability to maintain a clear airway will improve  Outcome: Met This Shift     Problem: Gas Exchange - Impaired:  Goal: Levels of oxygenation will improve  Description: Levels of oxygenation will improve  Outcome: Met This Shift

## 2018-11-06 NOTE — Care Coordination-Inpatient (Signed)
COVID negative 7/22. Met w/ patient. Explained role of case manager and plan of care. Lives alone in an apartment- no steps to entrance. DESO NOT drive- mother provides needed transportation. Has nebulizer. Currently on O2 3lNC- DOES NOT wear home O2. DOES NOT have a PCP- PCP preservice # placed on discharge instructions. Pharmacy is [place] Currently on iv steroid. Per pt, plan is to return home on discharge- will follow for possible home O2 needs Bobbie Stack case manager

## 2018-11-06 NOTE — Plan of Care (Signed)
Problem: Gas Exchange - Impaired:  Goal: Levels of oxygenation will improve  Description: Levels of oxygenation will improve  11/06/2018 2015 by Lum Babe, RN  Outcome: Ongoing

## 2018-11-07 MED FILL — ENOXAPARIN SODIUM 40 MG/0.4ML SC SOLN: 40 MG/0.4ML | SUBCUTANEOUS | Qty: 0.4

## 2018-11-07 MED FILL — MUCUS RELIEF 400 MG PO TABS: 400 MG | ORAL | Qty: 1

## 2018-11-07 MED FILL — METHYLPREDNISOLONE SODIUM SUCC 40 MG IJ SOLR: 40 MG | INTRAMUSCULAR | Qty: 40

## 2018-11-07 MED FILL — MAPAP 325 MG PO TABS: 325 MG | ORAL | Qty: 2

## 2018-11-07 NOTE — Progress Notes (Signed)
Ray Rodgers Progress Note    Admitting Date and Time: 11/05/2018  8:38 PM  Admit Dx: Asthma exacerbation attacks [J45.901]  Asthma exacerbation attacks [J45.901]  Asthma exacerbation attacks [J45.901]  Asthma exacerbation attacks [J45.901]    Subjective:  Patient is being followed for Asthma exacerbation attacks [J45.901]  Asthma exacerbation attacks [J45.901]  Asthma exacerbation attacks [J45.901]  Asthma exacerbation attacks [J45.901]   Pt feels only slightly improved compared to yesterday with his breathing.  He stills feels like he has mucous in his lungs.  Per RN: patient became very dyspneic when he got a shower yesterday afternoon.  He is doing okay now on 3 L NC.      ROS: denies fever, chills, cp, sob, n/v, HA unless stated above.     ??? sodium chloride flush  10 mL Intravenous 2 times per day   ??? enoxaparin  40 mg Subcutaneous Daily   ??? methylPREDNISolone  40 mg Intravenous Q6H   ??? budesonide  250 mcg Nebulization BID   ??? Arformoterol Tartrate  15 mcg Nebulization BID   ??? ipratropium  0.5 mg Nebulization 4x daily     albuterol, 1 ampule, Q4H PRN  sodium chloride flush, 10 mL, PRN  acetaminophen, 650 mg, Q6H PRN    Or  acetaminophen, 650 mg, Q6H PRN  polyethylene glycol, 17 g, Daily PRN  promethazine, 12.5 mg, Q6H PRN    Or  ondansetron, 4 mg, Q6H PRN  guaiFENesin, 400 mg, 4x Daily PRN         Objective:    BP 133/76    Pulse 64    Temp 97.6 ??F (36.4 ??C) (Oral)    Resp 18    Ht 6' (1.829 m)    Wt 190 lb (86.2 kg)    SpO2 96%    BMI 25.77 kg/m??     General Appearance: alert and oriented to person, place and time and in no acute distress  Skin: warm and dry  Head: normocephalic and atraumatic  Eyes: pupils equal, round, and reactive to light, extraocular eye movements intact, conjunctivae normal  Neck: neck supple and non tender without mass   Pulmonary/Chest: clear to auscultation bilaterally- no wheezes, rales or rhonchi, normal air movement, no respiratory distress  Cardiovascular: normal  rate, normal S1 and S2 and no carotid bruits  Abdomen: soft, non-tender, non-distended, normal bowel sounds, no masses or organomegaly  Extremities: no cyanosis, no clubbing and no edema  Neurologic: no cranial nerve deficit and speech normal        Recent Labs     11/05/18  2144 11/06/18  0320   NA 139 139   K 4.1 4.5   CL 102 100   CO2 26 26   BUN 15 16   CREATININE 1.1 1.1   GLUCOSE 101* 200*   CALCIUM 9.4 9.1       Recent Labs     11/05/18  2053 11/06/18  0320   WBC 8.4 8.0   RBC 5.22 5.15   HGB 15.0 14.9   HCT 44.3 44.3   MCV 84.9 86.0   MCH 28.7 28.9   MCHC 33.9 33.6   RDW 14.0 14.0   PLT 241 241   MPV 10.5 10.7       Radiology: CXR was normal on 7/22    Assessment:    Active Problems:    Asthma exacerbation attacks    Exacerbation of asthma    Elevated lactic acid level    Dehydration  Hyperglycemia  Resolved Problems:    * No resolved hospital problems. *      Plan:  1.  Acute asthma exacerbation-slowly improving.  Weaning O2; continue steroids, nebs, mucinex prn  2.  Dehydration-improved  3.  Hyperglycemia-likely secondary to steroids; continue to monitor      NOTE: This report was transcribed using voice recognition software. Every effort was made to ensure accuracy; however, inadvertent computerized transcription errors may be present.  Electronically signed by Charlesetta IvoryKELLY J Turner Kunzman, MD on 11/07/2018 at 8:02 AM

## 2018-11-07 NOTE — Progress Notes (Signed)
[facility] Quality Flow/Interdisciplinary Rounds Progress Note        Quality Flow Rounds held on November 07, 2018    Disciplines Attending:  Bedside Nurse, Social Worker, Case Manager and Nursing Unit Leadership    Ray Rodgers was admitted on 11/05/2018  8:38 PM    Anticipated Discharge Date:  Expected Discharge Date: 11/07/18    Disposition:    Braden Score:  Braden Scale Score: 22    Readmission Risk              Risk of Unplanned Readmission:        14           Discussed patient goal for the day, patient clinical progression, and barriers to discharge.  The following Goal(s) of the Day/Commitment(s) have been identified:  Monitor oxygenation levels      [name] [facility]  November 07, 2018

## 2018-11-07 NOTE — Progress Notes (Signed)
Weaned patient to [facility]. At rest, patient was 92-93% on [facility]. Will monitor and reassess

## 2018-11-07 NOTE — Progress Notes (Signed)
Pulse ox was _96_% on 3 liters nasal cannula at rest.  Ambulated patient on 3 liters nasal cannula.  Oxygen saturation was __96%  on _3_ liters nasal cannula while ambulating.  Recovery pulse ox was  _97_% on 3__ liters of oxygen at rest.     Will attempt again on a lower amount of oxygen or on RA at later time.will continue to monitor.

## 2018-11-07 NOTE — Plan of Care (Signed)
Problem: Falls - Risk of:  Goal: Will remain free from falls  Description: Will remain free from falls  Outcome: Met This Shift     Problem: Gas Exchange - Impaired:  Goal: Levels of oxygenation will improve  Description: Levels of oxygenation will improve  Outcome: Ongoing

## 2018-11-07 NOTE — Progress Notes (Signed)
Pt up to bathroom self care done. Pt took off  O 2 while in bathroom and returned back to bed. Slight sob sat on room air 88%  Applied 02 2 liter NC sat increased to 92%.  Pt resting in bed.  Hourly rounds continued.

## 2018-11-07 NOTE — Progress Notes (Signed)
Patient desat's and gets short of breath with ambulation. Patient instructed multiple times to not get up without assistance and to avoid walking all the way in the bathroom so that his oxygen does not decline. He verbalizes understanding, however continues to do it. Re-educated patient on safety/oxygen precautions. Will continue to monitor.

## 2018-11-07 NOTE — Plan of Care (Signed)
Problem: Discharge Planning:  Goal: Discharged to appropriate level of care  Description: Discharged to appropriate level of care  Outcome: Met This Shift     Problem: Activity Intolerance:  Goal: Ability to perform activities of daily living will improve  Description: Ability to perform activities of daily living will improve  Outcome: Met This Shift     Problem: Airway Clearance - Ineffective:  Goal: Ability to maintain a clear airway will improve  Description: Ability to maintain a clear airway will improve  Outcome: Met This Shift     Problem: Gas Exchange - Impaired:  Goal: Levels of oxygenation will improve  Description: Levels of oxygenation will improve  11/07/2018 2036 by Wandra Arthurs, RN  Outcome: Met This Shift  11/07/2018 1145 by Guadalupe Maple, RN  Outcome: Ongoing     Problem: Pain:  Goal: Pain level will decrease  Description: Pain level will decrease  Outcome: Met This Shift  Goal: Control of acute pain  Description: Control of acute pain  Outcome: Met This Shift  Goal: Control of chronic pain  Description: Control of chronic pain  Outcome: Met This Shift     Problem: Falls - Risk of:  Goal: Will remain free from falls  Description: Will remain free from falls  11/07/2018 2036 by Wandra Arthurs, RN  Outcome: Met This Shift  11/07/2018 1145 by Guadalupe Maple, RN  Outcome: Met This Shift  Goal: Absence of physical injury  Description: Absence of physical injury  Outcome: Met This Shift     Problem: Discharge Planning:  Goal: Discharged to appropriate level of care  Description: Discharged to appropriate level of care  Outcome: Met This Shift     Problem: Activity Intolerance:  Goal: Ability to perform activities of daily living will improve  Description: Ability to perform activities of daily living will improve  Outcome: Met This Shift     Problem: Airway Clearance - Ineffective:  Goal: Ability to maintain a clear airway will improve  Description: Ability to maintain a clear airway will  improve  Outcome: Met This Shift     Problem: Gas Exchange - Impaired:  Goal: Levels of oxygenation will improve  Description: Levels of oxygenation will improve  11/07/2018 2036 by Wandra Arthurs, RN  Outcome: Met This Shift  11/07/2018 1145 by Guadalupe Maple, RN  Outcome: Ongoing     Problem: Pain:  Goal: Pain level will decrease  Description: Pain level will decrease  Outcome: Met This Shift  Goal: Control of acute pain  Description: Control of acute pain  Outcome: Met This Shift  Goal: Control of chronic pain  Description: Control of chronic pain  Outcome: Met This Shift     Problem: Falls - Risk of:  Goal: Will remain free from falls  Description: Will remain free from falls  11/07/2018 2036 by Wandra Arthurs, RN  Outcome: Met This Shift  11/07/2018 1145 by Guadalupe Maple, RN  Outcome: Met This Shift  Goal: Absence of physical injury  Description: Absence of physical injury  Outcome: Met This Shift

## 2018-11-08 MED FILL — NORMAL SALINE FLUSH 0.9 % IV SOLN: 0.9 % | INTRAVENOUS | Qty: 10

## 2018-11-08 MED FILL — METHYLPREDNISOLONE SODIUM SUCC 40 MG IJ SOLR: 40 MG | INTRAMUSCULAR | Qty: 40

## 2018-11-08 MED FILL — MUCUS RELIEF 400 MG PO TABS: 400 MG | ORAL | Qty: 1

## 2018-11-08 MED FILL — ENOXAPARIN SODIUM 40 MG/0.4ML SC SOLN: 40 MG/0.4ML | SUBCUTANEOUS | Qty: 0.4

## 2018-11-08 NOTE — Plan of Care (Signed)
Problem: Discharge Planning:  Goal: Discharged to appropriate level of care  Description: Discharged to appropriate level of care  Outcome: Met This Shift     Problem: Activity Intolerance:  Goal: Ability to perform activities of daily living will improve  Description: Ability to perform activities of daily living will improve  Outcome: Met This Shift     Problem: Airway Clearance - Ineffective:  Goal: Ability to maintain a clear airway will improve  Description: Ability to maintain a clear airway will improve  Outcome: Met This Shift     Problem: Gas Exchange - Impaired:  Goal: Levels of oxygenation will improve  Description: Levels of oxygenation will improve  Outcome: Met This Shift     Problem: Pain:  Goal: Pain level will decrease  Description: Pain level will decrease  Outcome: Met This Shift  Goal: Control of acute pain  Description: Control of acute pain  Outcome: Met This Shift     Problem: Falls - Risk of:  Goal: Will remain free from falls  Description: Will remain free from falls  Outcome: Met This Shift  Goal: Absence of physical injury  Description: Absence of physical injury  Outcome: Met This Shift

## 2018-11-08 NOTE — Progress Notes (Signed)
[facility] Quality Flow/Interdisciplinary Rounds Progress Note        Quality Flow Rounds held on November 08, 2018    Disciplines Attending:  Bedside Nurse, Social Worker, Case Manager and Nursing Unit Leadership    Ray Rodgers was admitted on 11/05/2018  8:38 PM    Anticipated Discharge Date:  Expected Discharge Date: 11/07/18    Disposition:    Braden Score:  Braden Scale Score: 21    Readmission Risk              Risk of Unplanned Readmission:        14           Discussed patient goal for the day, patient clinical progression, and barriers to discharge.  The following Goal(s) of the Day/Commitment(s) have been identified: decrease O2 need, ambulate on Room air, discharge.       [name]  November 08, 2018

## 2018-11-08 NOTE — Progress Notes (Signed)
[facility] Progress Note    Admitting Date and Time: 11/05/2018  8:38 PM  Admit Dx: Asthma exacerbation attacks [J45.901]  Asthma exacerbation attacks [J45.901]  Asthma exacerbation attacks [J45.901]  Asthma exacerbation attacks [J45.901]    Subjective:  Patient is being followed for Asthma exacerbation attacks [J45.901]  Asthma exacerbation attacks [J45.901]  Asthma exacerbation attacks [J45.901]  Asthma exacerbation attacks [J45.901]   Pt feels a little better this morning   Per RN: still being hypoxic when moving around.    ROS: denies fever, chills, cp, sob, n/v, HA unless stated above.     ??? sodium chloride flush  10 mL Intravenous 2 times per day   ??? enoxaparin  40 mg Subcutaneous Daily   ??? methylPREDNISolone  40 mg Intravenous Q6H   ??? budesonide  250 mcg Nebulization BID   ??? Arformoterol Tartrate  15 mcg Nebulization BID   ??? ipratropium  0.5 mg Nebulization 4x daily     albuterol, 1 ampule, Q4H PRN  sodium chloride flush, 10 mL, PRN  acetaminophen, 650 mg, Q6H PRN    Or  acetaminophen, 650 mg, Q6H PRN  polyethylene glycol, 17 g, Daily PRN  promethazine, 12.5 mg, Q6H PRN    Or  ondansetron, 4 mg, Q6H PRN  guaiFENesin, 400 mg, 4x Daily PRN         Objective:    BP 119/64    Pulse 73    Temp 98.7 ??F (37.1 ??C) (Oral)    Resp 16    Ht 6' (1.829 m)    Wt 190 lb (86.2 kg)    SpO2 95%    BMI 25.77 kg/m??     General Appearance: alert and oriented to person, place and time and in no acute distress  Skin: warm and dry  Head: normocephalic and atraumatic  Eyes: pupils equal, round, and reactive to light, extraocular eye movements intact, conjunctivae normal  Neck: neck supple and non tender without mass   Pulmonary/Chest: clear to auscultation bilaterally- no wheezes, rales or rhonchi, normal air movement, no respiratory distress  Cardiovascular: normal rate, normal S1 and S2 and no carotid bruits  Abdomen: soft, non-tender, non-distended, normal bowel sounds, no masses or organomegaly  Extremities:  no cyanosis, no clubbing and no edema  Neurologic: no cranial nerve deficit and speech normal        Recent Labs     11/05/18  2144 11/06/18  0320   NA 139 139   K 4.1 4.5   CL 102 100   CO2 26 26   BUN 15 16   CREATININE 1.1 1.1   GLUCOSE 101* 200*   CALCIUM 9.4 9.1       Recent Labs     11/05/18  2053 11/06/18  0320   WBC 8.4 8.0   RBC 5.22 5.15   HGB 15.0 14.9   HCT 44.3 44.3   MCV 84.9 86.0   MCH 28.7 28.9   MCHC 33.9 33.6   RDW 14.0 14.0   PLT 241 241   MPV 10.5 10.7       Radiology: CXR was normal on 7/22    Assessment:    Active Problems:    Asthma exacerbation attacks    Exacerbation of asthma    Elevated lactic acid level    Dehydration    Hyperglycemia  Resolved Problems:    * No resolved hospital problems. *      Plan:  1.  Acute asthma exacerbation-slowly improving.  Weaning  O2; continue steroids, nebs, mucinex prn  2.  Dehydration-improved  3.  Hyperglycemia-likely secondary to steroids; continue to monitor      NOTE: This report was transcribed using voice recognition software. Every effort was made to ensure accuracy; however, inadvertent computerized transcription errors may be present.  Electronically signed by Charlesetta IvoryKELLY J Carlous Olivares, MD on 11/08/2018 at 7:54 AM

## 2018-11-09 MED FILL — METHYLPREDNISOLONE SODIUM SUCC 40 MG IJ SOLR: 40 MG | INTRAMUSCULAR | Qty: 40

## 2018-11-09 MED FILL — NORMAL SALINE FLUSH 0.9 % IV SOLN: 0.9 % | INTRAVENOUS | Qty: 10

## 2018-11-09 MED FILL — MUCUS RELIEF 400 MG PO TABS: 400 MG | ORAL | Qty: 1

## 2018-11-09 MED FILL — ENOXAPARIN SODIUM 40 MG/0.4ML SC SOLN: 40 MG/0.4ML | SUBCUTANEOUS | Qty: 0.4

## 2018-11-09 NOTE — Plan of Care (Signed)
Problem: Activity Intolerance:  Goal: Ability to perform activities of daily living will improve  Description: Ability to perform activities of daily living will improve  11/09/2018 0152 by Raphael Gibney, RN  Outcome: Met This Shift     Problem: Airway Clearance - Ineffective:  Goal: Ability to maintain a clear airway will improve  Description: Ability to maintain a clear airway will improve  11/09/2018 0152 by Raphael Gibney, RN  Outcome: Met This Shift     Problem: Gas Exchange - Impaired:  Goal: Levels of oxygenation will improve  Description: Levels of oxygenation will improve  11/09/2018 0152 by Raphael Gibney, RN  Outcome: Met This Shift     Problem: Pain:  Goal: Pain level will decrease  Description: Pain level will decrease  11/09/2018 0152 by Raphael Gibney, RN  Outcome: Met This Shift     Problem: Pain:  Goal: Control of acute pain  Description: Control of acute pain  11/09/2018 0152 by Raphael Gibney, RN  Outcome: Met This Shift     Problem: Falls - Risk of:  Goal: Will remain free from falls  Description: Will remain free from falls  11/09/2018 0152 by Raphael Gibney, RN  Outcome: Met This Shift     Problem: Falls - Risk of:  Goal: Absence of physical injury  Description: Absence of physical injury  11/09/2018 0152 by Raphael Gibney, RN  Outcome: Met This Shift     Problem: Discharge Planning:  Goal: Discharged to appropriate level of care  Description: Discharged to appropriate level of care  11/09/2018 0152 by Raphael Gibney, RN  Outcome: Ongoing

## 2018-11-09 NOTE — Progress Notes (Signed)
[facility] Progress Note    Admitting Date and Time: 11/05/2018  8:38 PM  Admit Dx: Asthma exacerbation attacks [J45.901]  Asthma exacerbation attacks [J45.901]  Asthma exacerbation attacks [J45.901]  Asthma exacerbation attacks [J45.901]    Subjective:  Patient is being followed for Asthma exacerbation attacks [J45.901]  Asthma exacerbation attacks [J45.901]  Asthma exacerbation attacks [J45.901]  Asthma exacerbation attacks [J45.901]   Pt feels a little better this morning   Per RN: still being hypoxic when moving around.    ROS: denies fever, chills, cp, sob, n/v, HA unless stated above.     ??? sodium chloride flush  10 mL Intravenous 2 times per day   ??? enoxaparin  40 mg Subcutaneous Daily   ??? methylPREDNISolone  40 mg Intravenous Q6H   ??? budesonide  250 mcg Nebulization BID   ??? Arformoterol Tartrate  15 mcg Nebulization BID   ??? ipratropium  0.5 mg Nebulization 4x daily     albuterol, 1 ampule, Q4H PRN  sodium chloride flush, 10 mL, PRN  acetaminophen, 650 mg, Q6H PRN    Or  acetaminophen, 650 mg, Q6H PRN  polyethylene glycol, 17 g, Daily PRN  promethazine, 12.5 mg, Q6H PRN    Or  ondansetron, 4 mg, Q6H PRN  guaiFENesin, 400 mg, 4x Daily PRN         Objective:    BP 127/71    Pulse 78    Temp 99.6 ??F (37.6 ??C) (Oral)    Resp 16    Ht 6' (1.829 m)    Wt 190 lb (86.2 kg)    SpO2 93%    BMI 25.77 kg/m??     General Appearance: alert and oriented to person, place and time and in no acute distress  Skin: warm and dry  Head: normocephalic and atraumatic  Eyes: pupils equal, round, and reactive to light, extraocular eye movements intact, conjunctivae normal  Neck: neck supple and non tender without mass   Pulmonary/Chest: clear to auscultation bilaterally- no wheezes, rales or rhonchi, normal air movement, no respiratory distress  Cardiovascular: normal rate, normal S1 and S2 and no carotid bruits  Abdomen: soft, non-tender, non-distended, normal bowel sounds, no masses or organomegaly  Extremities:  no cyanosis, no clubbing and no edema  Neurologic: no cranial nerve deficit and speech normal        No results for input(s): NA, K, CL, CO2, BUN, CREATININE, GLUCOSE, CALCIUM in the last 72 hours.    No results for input(s): WBC, RBC, HGB, HCT, MCV, MCH, MCHC, RDW, PLT, MPV in the last 72 hours.    Radiology: CXR was normal on 7/22    Assessment:    Active Problems:    Asthma exacerbation attacks    Exacerbation of asthma    Elevated lactic acid level    Dehydration    Hyperglycemia  Resolved Problems:    * No resolved hospital problems. *      Plan:  1.  Acute asthma exacerbation-slowly improving.  Weaning O2; continue steroids, nebs, mucinex prn  2.  Dehydration-improved  3.  Hyperglycemia-likely secondary to steroids; continue to monitor    Can D/C once patient able to maintain O2 sats above 90% with activity.    NOTE: This report was transcribed using voice recognition software. Every effort was made to ensure accuracy; however, inadvertent computerized transcription errors may be present.  Electronically signed by Lavenia Atlas, MD on 11/09/2018 at 8:58 AM

## 2018-11-09 NOTE — Plan of Care (Signed)
Problem: Discharge Planning:  Goal: Discharged to appropriate level of care  Description: Discharged to appropriate level of care  11/09/2018 1024 by Lillie Fragmin, RN  Outcome: Met This Shift     Problem: Activity Intolerance:  Goal: Ability to perform activities of daily living will improve  Description: Ability to perform activities of daily living will improve  11/09/2018 1024 by Lillie Fragmin, RN  Outcome: Met This Shift     Problem: Airway Clearance - Ineffective:  Goal: Ability to maintain a clear airway will improve  Description: Ability to maintain a clear airway will improve  11/09/2018 1024 by Lillie Fragmin, RN  Outcome: Met This Shift     Problem: Gas Exchange - Impaired:  Goal: Levels of oxygenation will improve  Description: Levels of oxygenation will improve  11/09/2018 1024 by Lillie Fragmin, RN  Outcome: Met This Shift     Problem: Pain:  Goal: Pain level will decrease  Description: Pain level will decrease  11/09/2018 1024 by Lillie Fragmin, RN  Outcome: Met This Shift     Problem: Pain:  Goal: Control of acute pain  Description: Control of acute pain  11/09/2018 1024 by Lillie Fragmin, RN  Outcome: Met This Shift     Problem: Falls - Risk of:  Goal: Will remain free from falls  Description: Will remain free from falls  11/09/2018 1024 by Lillie Fragmin, RN  Outcome: Met This Shift

## 2018-11-09 NOTE — Progress Notes (Signed)
[facility] Quality Flow/Interdisciplinary Rounds Progress Note        Quality Flow Rounds held on November 09, 2018    Disciplines Attending:  Bedside Nurse, Social Worker, Case Manager and Nursing Unit Leadership    Ray Rodgers was admitted on 11/05/2018  8:38 PM    Anticipated Discharge Date:  Expected Discharge Date: 11/07/18    Disposition:    Braden Score:  Braden Scale Score: 20    Readmission Risk              Risk of Unplanned Readmission:        14           Discussed patient goal for the day, patient clinical progression, and barriers to discharge.  The following Goal(s) of the Day/Commitment(s) have been identified:  Wean o2.       [name]  November 09, 2018

## 2018-11-09 NOTE — Progress Notes (Signed)
Pulse ox was 91% on room air at rest.  Ambulated patient on room air.   Oxygen saturation was 90%  on room air  while ambulating.  Recovery pulse ox was  94% on room air.

## 2018-11-10 MED ORDER — GUAIFENESIN 400 MG PO TABS
400 MG | ORAL_TABLET | Freq: Four times a day (QID) | ORAL | 0 refills | Status: DC | PRN
Start: 2018-11-10 — End: 2019-09-16

## 2018-11-10 MED ORDER — ALBUTEROL SULFATE 0.63 MG/3ML IN NEBU
0.63 MG/3ML | Freq: Four times a day (QID) | RESPIRATORY_TRACT | 3 refills | Status: DC | PRN
Start: 2018-11-10 — End: 2018-11-13

## 2018-11-10 MED ORDER — PREDNISONE 10 MG PO TABS
10 MG | ORAL_TABLET | ORAL | 0 refills | Status: DC
Start: 2018-11-10 — End: 2018-11-13

## 2018-11-10 MED ORDER — FLUTICASONE-SALMETEROL 100-50 MCG/DOSE IN AEPB
100-50 MCG/DOSE | Freq: Two times a day (BID) | RESPIRATORY_TRACT | 2 refills | Status: DC
Start: 2018-11-10 — End: 2018-11-13

## 2018-11-10 MED ORDER — ALBUTEROL SULFATE HFA 108 (90 BASE) MCG/ACT IN AERS
108 (90 Base) MCG/ACT | RESPIRATORY_TRACT | 2 refills | Status: DC | PRN
Start: 2018-11-10 — End: 2019-02-04

## 2018-11-10 MED FILL — METHYLPREDNISOLONE SODIUM SUCC 40 MG IJ SOLR: 40 MG | INTRAMUSCULAR | Qty: 40

## 2018-11-10 MED FILL — NORMAL SALINE FLUSH 0.9 % IV SOLN: 0.9 % | INTRAVENOUS | Qty: 10

## 2018-11-10 NOTE — Plan of Care (Signed)
Problem: Discharge Planning:  Goal: Discharged to appropriate level of care  Description: Discharged to appropriate level of care  Outcome: Met This Shift     Problem: Activity Intolerance:  Goal: Ability to perform activities of daily living will improve  Description: Ability to perform activities of daily living will improve  Outcome: Met This Shift     Problem: Airway Clearance - Ineffective:  Goal: Ability to maintain a clear airway will improve  Description: Ability to maintain a clear airway will improve  Outcome: Met This Shift     Problem: Gas Exchange - Impaired:  Goal: Levels of oxygenation will improve  Description: Levels of oxygenation will improve  Outcome: Met This Shift     Problem: Pain:  Goal: Pain level will decrease  Description: Pain level will decrease  Outcome: Met This Shift     Problem: Pain:  Goal: Control of acute pain  Description: Control of acute pain  Outcome: Met This Shift     Problem: Falls - Risk of:  Goal: Will remain free from falls  Description: Will remain free from falls  Outcome: Met This Shift     Problem: Falls - Risk of:  Goal: Absence of physical injury  Description: Absence of physical injury  Outcome: Met This Shift

## 2018-11-10 NOTE — Progress Notes (Signed)
[facility] Progress Note    Admitting Date and Time: 11/05/2018  8:38 PM  Admit Dx: Asthma exacerbation attacks [J45.901]  Asthma exacerbation attacks [J45.901]  Asthma exacerbation attacks [J45.901]  Asthma exacerbation attacks [J45.901]    Subjective:  Patient is being followed for Asthma exacerbation attacks [J45.901]  Asthma exacerbation attacks [J45.901]  Asthma exacerbation attacks [J45.901]  Asthma exacerbation attacks [J45.901]   Pt feels a little better this morning; has been weaned from oxygen.  Improved O2 with ambulation.   Per RN: significant improvement    ROS: denies fever, chills, cp, sob, n/v, HA unless stated above.     ??? sodium chloride flush  10 mL Intravenous 2 times per day   ??? enoxaparin  40 mg Subcutaneous Daily   ??? methylPREDNISolone  40 mg Intravenous Q6H   ??? budesonide  250 mcg Nebulization BID   ??? Arformoterol Tartrate  15 mcg Nebulization BID   ??? ipratropium  0.5 mg Nebulization 4x daily     albuterol, 1 ampule, Q4H PRN  sodium chloride flush, 10 mL, PRN  acetaminophen, 650 mg, Q6H PRN    Or  acetaminophen, 650 mg, Q6H PRN  polyethylene glycol, 17 g, Daily PRN  promethazine, 12.5 mg, Q6H PRN    Or  ondansetron, 4 mg, Q6H PRN  guaiFENesin, 400 mg, 4x Daily PRN         Objective:    BP 117/63    Pulse 66    Temp 98 ??F (36.7 ??C) (Oral)    Resp 18    Ht 6' (1.829 m)    Wt 190 lb (86.2 kg)    SpO2 95%    BMI 25.77 kg/m??     General Appearance: alert and oriented to person, place and time and in no acute distress  Skin: warm and dry  Head: normocephalic and atraumatic  Eyes: pupils equal, round, and reactive to light, extraocular eye movements intact, conjunctivae normal  Neck: neck supple and non tender without mass   Pulmonary/Chest: clear to auscultation bilaterally- scattered wheezes, no rales or rhonchi, normal air movement, no respiratory distress  Cardiovascular: normal rate, normal S1 and S2 and no carotid bruits  Abdomen: soft, non-tender, non-distended, normal  bowel sounds, no masses or organomegaly  Extremities: no cyanosis, no clubbing and no edema  Neurologic: no cranial nerve deficit and speech normal        No results for input(s): NA, K, CL, CO2, BUN, CREATININE, GLUCOSE, CALCIUM in the last 72 hours.    No results for input(s): WBC, RBC, HGB, HCT, MCV, MCH, MCHC, RDW, PLT, MPV in the last 72 hours.    Radiology: CXR was normal on 7/22    Assessment:    Active Problems:    Asthma exacerbation attacks    Exacerbation of asthma    Elevated lactic acid level    Dehydration    Hyperglycemia  Resolved Problems:    * No resolved hospital problems. *      Plan:  1.  Acute asthma exacerbation-very slowly improving, but there is daily improvement.  Appetite is good.  Patient respires easily      Patient was planned for discharge today, however, he was noted to have worsening wheezing in the afternoon and hypoxia below 90%.  He was placed back on NC and immediately his O2 saturation improved.  We will continue solumedrol, nebs and monitor overnight.  Consult pulm in AM if symptoms are still not improving.    COVID negative  CXR on admission negative  WBC normal    2.  Dehydration-improved, discontinue IVF  3.  Hyperglycemia-likely secondary to steroids; continue to monitor    Can D/C once patient able to maintain O2 sats above 90% with activity.    NOTE: This report was transcribed using voice recognition software. Every effort was made to ensure accuracy; however, inadvertent computerized transcription errors may be present.  Electronically signed by Charlesetta IvoryKELLY J Vivi Piccirilli, MD on 11/11/2018 at 9:06 AM

## 2018-11-10 NOTE — Plan of Care (Signed)
Problem: Discharge Planning:  Goal: Discharged to appropriate level of care  Description: Discharged to appropriate level of care  11/10/2018 0006 by Raphael Gibney, RN  Outcome: Met This Shift     Problem: Activity Intolerance:  Goal: Ability to perform activities of daily living will improve  Description: Ability to perform activities of daily living will improve  11/10/2018 0006 by Raphael Gibney, RN  Outcome: Met This Shift     Problem: Airway Clearance - Ineffective:  Goal: Ability to maintain a clear airway will improve  Description: Ability to maintain a clear airway will improve  11/10/2018 0006 by Raphael Gibney, RN  Outcome: Met This Shift     Problem: Pain:  Goal: Control of acute pain  Description: Control of acute pain  11/10/2018 0006 by Raphael Gibney, RN  Outcome: Met This Shift     Problem: Falls - Risk of:  Goal: Will remain free from falls  Description: Will remain free from falls  11/10/2018 0006 by Raphael Gibney, RN  Outcome: Met This Shift

## 2018-11-10 NOTE — Care Coordination-Inpatient (Signed)
Social Work:    Runner, broadcasting/film/video met with Ray Rodgers, along with case Manufacturing engineer. We explained our roles within case manger and discussed discharge planning. Ray Rodgers resides alone and is presently collecting unemployment. He has nebulizer but no other durable medical equipment. Ray Rodgers has no PCP and is interested in obtaining one. Social work made an appointment with Dr. Joylene John for August 3rd at 10:00 a.m.  Ray Rodgers actually was seen by Dr. Christain Sacramento in November and therefore is a patient, however, due to diagnosis of Asthma, he was advised that his appt will be virtual.  Social work updated Ray Rodgers who advised that this was fine but he would need instruction. Social work noted this appt on Jlon's discharge instructions. He has no other needs at this time.    Electronically signed by Windy Carina, LSW on 11/10/2018 at 10:58 AM

## 2018-11-10 NOTE — Plan of Care (Signed)
Problem: Anxiety:  Goal: Level of anxiety will decrease  Description: Level of anxiety will decrease  Outcome: Met This Shift

## 2018-11-11 ENCOUNTER — Inpatient Hospital Stay: Admit: 2018-11-11 | Payer: PRIVATE HEALTH INSURANCE

## 2018-11-11 LAB — COMPREHENSIVE METABOLIC PANEL
ALT: 30 U/L (ref 0–40)
AST: 13 U/L (ref 0–39)
Albumin: 3.7 g/dL (ref 3.5–5.2)
Alkaline Phosphatase: 95 U/L (ref 40–129)
Anion Gap: 10 mmol/L (ref 7–16)
BUN: 17 mg/dL (ref 6–20)
CO2: 26 mmol/L (ref 22–29)
Calcium: 8.8 mg/dL (ref 8.6–10.2)
Chloride: 99 mmol/L (ref 98–107)
Creatinine: 0.9 mg/dL (ref 0.7–1.2)
GFR African American: >60
GFR Non-African American: >60 mL/min/{1.73_m2} (ref >=60)
Glucose: 152 mg/dL — ABNORMAL HIGH (ref 74–99)
Potassium: 4.3 mmol/L (ref 3.5–5.0)
Sodium: 135 mmol/L (ref 132–146)
Total Bilirubin: 0.3 mg/dL (ref 0.0–1.2)
Total Protein: 6.1 g/dL — ABNORMAL LOW (ref 6.4–8.3)

## 2018-11-11 LAB — CBC WITH AUTO DIFFERENTIAL
Basophils %: 0.1 % (ref 0.0–2.0)
Basophils Absolute: 0.02 E9/L (ref 0.00–0.20)
Eosinophils %: 0 % (ref 0.0–6.0)
Eosinophils Absolute: 0 E9/L — ABNORMAL LOW (ref 0.05–0.50)
Hematocrit: 44.7 % (ref 37.0–54.0)
Hemoglobin: 15.1 g/dL (ref 12.5–16.5)
Immature Granulocytes #: 0.39 E9/L
Immature Granulocytes %: 2.8 % (ref 0.0–5.0)
Lymphocytes %: 6.8 % — ABNORMAL LOW (ref 20.0–42.0)
Lymphocytes Absolute: 0.96 E9/L — ABNORMAL LOW (ref 1.50–4.00)
MCH: 29.1 pg (ref 26.0–35.0)
MCHC: 33.8 % (ref 32.0–34.5)
MCV: 86.1 fL (ref 80.0–99.9)
MPV: 10.5 fL (ref 7.0–12.0)
Monocytes %: 3.8 % (ref 2.0–12.0)
Monocytes Absolute: 0.54 E9/L (ref 0.10–0.95)
Neutrophils %: 86.5 % — ABNORMAL HIGH (ref 43.0–80.0)
Neutrophils Absolute: 12.23 E9/L — ABNORMAL HIGH (ref 1.80–7.30)
Platelets: 236 E9/L (ref 130–450)
RBC: 5.19 E12/L (ref 3.80–5.80)
RDW: 13.6 fL (ref 11.5–15.0)
WBC: 14.1 E9/L — ABNORMAL HIGH (ref 4.5–11.5)

## 2018-11-11 LAB — PROCALCITONIN: Procalcitonin: 0.04 ng/mL (ref 0.00–0.08)

## 2018-11-11 MED ORDER — IPRATROPIUM-ALBUTEROL 0.5-2.5 (3) MG/3ML IN SOLN
RESPIRATORY_TRACT | Status: DC
Start: 2018-11-11 — End: 2018-11-13
  Administered 2018-11-11 – 2018-11-13 (×7): 1 via RESPIRATORY_TRACT

## 2018-11-11 MED ORDER — DOXYCYCLINE HYCLATE 100 MG IV SOLR
100 MG | Freq: Two times a day (BID) | INTRAVENOUS | Status: DC
Start: 2018-11-11 — End: 2018-11-12
  Administered 2018-11-11 – 2018-11-12 (×2): 100 mg via INTRAVENOUS

## 2018-11-11 MED ORDER — BUDESONIDE 0.25 MG/2ML IN SUSP
0.25 MG/2ML | Freq: Two times a day (BID) | RESPIRATORY_TRACT | Status: DC
Start: 2018-11-11 — End: 2018-11-13
  Administered 2018-11-12 – 2018-11-13 (×4): 1000 ug via RESPIRATORY_TRACT

## 2018-11-11 MED ORDER — FAMOTIDINE 20 MG PO TABS
20 MG | Freq: Two times a day (BID) | ORAL | Status: DC
Start: 2018-11-11 — End: 2018-11-13
  Administered 2018-11-11 – 2018-11-13 (×5): 20 mg via ORAL

## 2018-11-11 MED FILL — NORMAL SALINE FLUSH 0.9 % IV SOLN: 0.9 % | INTRAVENOUS | Qty: 10

## 2018-11-11 MED FILL — METHYLPREDNISOLONE SODIUM SUCC 40 MG IJ SOLR: 40 MG | INTRAMUSCULAR | Qty: 40

## 2018-11-11 MED FILL — IPRATROPIUM-ALBUTEROL 0.5-2.5 (3) MG/3ML IN SOLN: RESPIRATORY_TRACT | Qty: 3

## 2018-11-11 MED FILL — FAMOTIDINE 20 MG PO TABS: 20 MG | ORAL | Qty: 1

## 2018-11-11 MED FILL — MUCUS RELIEF 400 MG PO TABS: 400 MG | ORAL | Qty: 1

## 2018-11-11 MED FILL — DOXYCYCLINE HYCLATE 100 MG IV SOLR: 100 MG | INTRAVENOUS | Qty: 100

## 2018-11-11 MED FILL — DOXY 100 100 MG IV SOLR: 100 MG | INTRAVENOUS | Qty: 100

## 2018-11-11 MED FILL — ENOXAPARIN SODIUM 40 MG/0.4ML SC SOLN: 40 MG/0.4ML | SUBCUTANEOUS | Qty: 0.4

## 2018-11-11 MED FILL — BUDESONIDE 0.25 MG/2ML IN SUSP: 0.25 MG/2ML | RESPIRATORY_TRACT | Qty: 8

## 2018-11-11 NOTE — Plan of Care (Signed)
Problem: Activity Intolerance:  Goal: Ability to perform activities of daily living will improve  Description: Ability to perform activities of daily living will improve  11/11/2018 0844 by Park Meo Dell'Arco, RN  Outcome: Met This Shift     Problem: Airway Clearance - Ineffective:  Goal: Ability to maintain a clear airway will improve  Description: Ability to maintain a clear airway will improve  11/11/2018 0844 by Park Meo Dell'Arco, RN  Outcome: Met This Shift     Problem: Gas Exchange - Impaired:  Goal: Levels of oxygenation will improve  Description: Levels of oxygenation will improve  11/11/2018 0844 by Park Meo Dell'Arco, RN  Outcome: Met This Shift     Problem: Pain:  Goal: Pain level will decrease  Description: Pain level will decrease  11/11/2018 0844 by Park Meo Dell'Arco, RN  Outcome: Met This Shift     Problem: Pain:  Goal: Control of acute pain  Description: Control of acute pain  11/11/2018 0844 by Park Meo Dell'Arco, RN  Outcome: Met This Shift     Problem: Pain:  Goal: Control of chronic pain  Description: Control of chronic pain  Outcome: Met This Shift     Problem: Falls - Risk of:  Goal: Will remain free from falls  Description: Will remain free from falls  11/11/2018 0844 by Park Meo Dell'Arco, RN  Outcome: Met This Shift     Problem: Falls - Risk of:  Goal: Absence of physical injury  Description: Absence of physical injury  11/11/2018 0844 by Park Meo Dell'Arco, RN  Outcome: Met This Shift     Problem: Anxiety:  Goal: Level of anxiety will decrease  Description: Level of anxiety will decrease  11/11/2018 0844 by Park Meo Dell'Arco, RN  Outcome: Met This Shift     Problem: Discharge Planning:  Goal: Discharged to appropriate level of care  Description: Discharged to appropriate level of care  11/11/2018 0844 by Park Meo Dell'Arco, RN  Outcome: Not Met This Shift

## 2018-11-11 NOTE — Progress Notes (Addendum)
[facility]    Admitting Date and Time: 11/05/2018  8:38 PM  Admit Dx: Asthma exacerbation attacks [J45.901]  Asthma exacerbation attacks [J45.901]  Asthma exacerbation attacks [J45.901]  Asthma exacerbation attacks [J45.901]    Subjective:  Patient is being followed for Asthma exacerbation attacks [J45.901]  Asthma exacerbation attacks [J45.901]  Asthma exacerbation attacks [J45.901]  Asthma exacerbation attacks [J45.901]   Pt feels short of breath when walking.  Still requiring O2 via NC    Per RN: Patient still getting extremely short of breath when walking.  He remains afebrile.    ROS: denies fever, chills, cp, sob, n/v, HA unless stated above.     ??? sodium chloride flush  10 mL Intravenous 2 times per day   ??? enoxaparin  40 mg Subcutaneous Daily   ??? methylPREDNISolone  40 mg Intravenous Q6H   ??? budesonide  250 mcg Nebulization BID   ??? Arformoterol Tartrate  15 mcg Nebulization BID   ??? ipratropium  0.5 mg Nebulization 4x daily     albuterol, 1 ampule, Q4H PRN  sodium chloride flush, 10 mL, PRN  acetaminophen, 650 mg, Q6H PRN    Or  acetaminophen, 650 mg, Q6H PRN  polyethylene glycol, 17 g, Daily PRN  promethazine, 12.5 mg, Q6H PRN    Or  ondansetron, 4 mg, Q6H PRN  guaiFENesin, 400 mg, 4x Daily PRN         Objective:    BP 117/63    Pulse 66    Temp 98 ??F (36.7 ??C) (Oral)    Resp 18    Ht 6' (1.829 m)    Wt 190 lb (86.2 kg)    SpO2 95%    BMI 25.77 kg/m??     General Appearance: alert and oriented to person, place and time and in no acute distress  Skin: warm and dry  Head: normocephalic and atraumatic  Eyes: pupils equal, round, and reactive to light, extraocular eye movements intact, conjunctivae normal  Neck: neck supple and non tender without mass   Pulmonary/Chest: clear to auscultation bilaterally- scattered wheezes, no rales or rhonchi, normal air movement, no respiratory distress  Cardiovascular: normal rate, normal S1 and S2 and no carotid bruits  Abdomen: soft,  non-tender, non-distended, normal bowel sounds, no masses or organomegaly  Extremities: no cyanosis, no clubbing and no edema  Neurologic: no cranial nerve deficit and speech normal        No results for input(s): NA, K, CL, CO2, BUN, CREATININE, GLUCOSE, CALCIUM in the last 72 hours.    No results for input(s): WBC, RBC, HGB, HCT, MCV, MCH, MCHC, RDW, PLT, MPV in the last 72 hours.    Radiology: CXR was normal on 7/22    Assessment:    Active Problems:    Asthma exacerbation attacks    Exacerbation of asthma    Elevated lactic acid level    Dehydration    Hyperglycemia  Resolved Problems:    * No resolved hospital problems. *      Plan:  1.  Acute asthma exacerbation-there was daily improvement, until yesterday afternoon.  Appetite is good.     Patient was planned for discharge today, however, he was noted to have worsening wheezing in the afternoon and hypoxia below 90%.  He was placed back on NC and immediately his O2 saturation improved.  Discharge held.  We will continue solumedrol, nebs and monitor.  Consult pulm since symptoms are still not improving despite usual treatment and  lack of other signs/symptoms.  Today is Day 5 of hospitalization.      COVID negative  CXR on admission negative; repeat CXR this morning  WBC normal, afebrile, normotensive  Will add pepcid to cover for possible GERD  Will add doxycycline to cover for bacterial bronchitis  Await this AM's labs and will add procalcitonin    2.  Dehydration-resolved, discontinue IVF  3.  Hyperglycemia-likely secondary to steroids; continue to monitor    Can D/C once patient able to maintain O2 sats above 90% with activity.    NOTE: This report was transcribed using voice recognition software. Every effort was made to ensure accuracy; however, inadvertent computerized transcription errors may be present.  Electronically signed by Charlesetta Ivory, MD on 11/11/2018 at 9:15 AM

## 2018-11-11 NOTE — Progress Notes (Signed)
[facility] Quality Flow/Interdisciplinary Rounds Progress Note        Quality Flow Rounds held on November 11, 2018    Disciplines Attending:  Bedside Nurse, Social Worker, Case Manager and Nursing Unit Leadership    Ray Rodgers was admitted on 11/05/2018  8:38 PM    Anticipated Discharge Date:  Expected Discharge Date: 11/07/18    Disposition:    Braden Score:  Braden Scale Score: 21    Readmission Risk              Risk of Unplanned Readmission:        14           Discussed patient goal for the day, patient clinical progression, and barriers to discharge.  The following Goal(s) of the Day/Commitment(s) have been identified:  Discharge Planning      [facility] Fathima Bartl  November 11, 2018

## 2018-11-11 NOTE — Plan of Care (Signed)
Problem: Pain:  Goal: Pain level will decrease  Description: Pain level will decrease  11/11/2018 2002 by Trula Slade, RN  Outcome: Met This Shift     Problem: Pain:  Goal: Control of acute pain  Description: Control of acute pain  11/11/2018 2002 by Trula Slade, RN  Outcome: Met This Shift     Problem: Gas Exchange - Impaired:  Goal: Levels of oxygenation will improve  Description: Levels of oxygenation will improve  11/11/2018 2002 by Trula Slade, RN  Outcome: Ongoing

## 2018-11-11 NOTE — Progress Notes (Addendum)
Walking O2 done.  Spo2 on room air at rest 90-92%.  Spo2 on room air while ambulating 89-91%.  Recovered on 3L N/C after 1-2 minutes to 93%.    Pt noticeably dyspneic with ambulation.    Electronically signed by Victory Dakin, RN on 11/11/2018 at 1:36 PM

## 2018-11-11 NOTE — Care Coordination-Inpatient (Addendum)
COVID negative 7/22. Discharge cancelled yesterday d/t SOB/ increased O2 needs. Currently on O2 3lNC-DOES NOT wear home O2- will need home O2 testing. On iv steroid. Pulm consulted. PCP appt made per SW for 8/3 @ 10am- info on DC instructions. Phill Mutter case manager

## 2018-11-11 NOTE — Consults (Signed)
Pulmonary Consultation    Admit Date: 11/05/2018  Requesting Physician: No primary care provider on file.    Reason for consultation:  ?? Asthma with status asthmaticus  HPI:  ?? The patient is a 38 year old Hispanic male with a longstanding history of asthma.  Judged to be moderate persistent in character, the patient typically takes Advair 250 supplemented with albuterol.  The Advair, according the patient, did have a world of good and his asthma stayed under control.    ?? More recently, the patient changed domicile's.  He noted that upon going to the new place, he had to do a lot of cleaning.  He does not accuse the cleaning solutions as being problematic.  He is unaware of the previous occupant of the home having had any pets.    ?? Patient denies any specific triggers including cold air exercise or strong smells.  He states that his asthma was well controlled for years.  Review of the patient's record shows that he was seen in the emergency room on July 17 and discharged on prednisone.  Blood work from that visit, despite getting 125 mg of Solu-Medrol in the ambulance, shows significant eosinophilia.  It is posted below.    PMH:    Past Medical History:   Diagnosis Date   ??? Asthma      PSH:   History reviewed. No pertinent surgical history.    Review of Systems:  ?? Constitutional: As noted in the HPI. Denies fever or chills.  ?? Eyes: No visual changes or diplopia. No scleral icterus.  ?? ENT: No headaches, hearing loss or vertigo. No nasal congestion, or sore throat.  ?? Cardiovascular: No chest pain, dyspnea on exertion, or palpitations.  ?? Respiratory: See above  ?? Gastrointestinal: No abdominal pain, nausea or emesis. No diarrhea or rectal bleeding or melena. No change in bowel habits.   ?? Genitourinary: No dysuria, urinary frequency, or incontinence. No hematuria.  ?? Musculoskeletal: No gait disturbance, weakness or joint complaints.  ?? Integumentary: No rash or pruritis. No abnormal  pigmentation, hair or nail changes.  ?? Neurological: No headache, diplopia, dizziness, tremor, change in muscle strength, numbness or tingling. No change in gait, balance, coordination, mood, affect, memory, mentation, behavior.  ?? Psychiatric: No anxiety or depression.  ?? Endocrine: No temperature intolerance, excessive thirst, fluid intake, urinary frequency, excessive appetite, or recent weight change.   ?? Hematologic/Lymphatic: No abnormal bruising or bleeding, blood clots or swollen lymph nodes. No anemia, fever, chills, night sweats, or swollen glands.  ?? Allergic/Immunologic: No seasonal or perenial allergies. No history of hives or atopic dermatitis.    Social History:  ?? Alcohol:  No  ?? Tobacco:   Quit recently.  ?? Employment:  no silica or asbestos exposure  ?? Family:  No family history of lung disease    Medications:    ??? famotidine  20 mg Oral BID   ??? doxycycline (VIBRAMYCIN) IV  100 mg Intravenous Q12H   ??? budesonide  1,000 mcg Nebulization BID   ??? ipratropium-albuterol  1 ampule Inhalation Q4H   ??? sodium chloride flush  10 mL Intravenous 2 times per day   ??? enoxaparin  40 mg Subcutaneous Daily   ??? methylPREDNISolone  40 mg Intravenous Q6H   ??? Arformoterol Tartrate  15 mcg Nebulization BID       Vitals:  Tmax:  VITALS:  BP 117/63    Pulse 66    Temp 98 ??F (36.7 ??C) (Oral)    Resp  18    Ht 6' (1.829 m)    Wt 190 lb (86.2 kg)    SpO2 95%    BMI 25.77 kg/m??   24HR INTAKE/OUTPUT:  No intake or output data in the 24 hours ending 11/11/18 1258  CURRENT PULSE OXIMETRY:  SpO2: 95 %  24HR PULSE OXIMETRY RANGE:  SpO2  Avg: 95.4 %  Min: 94 %  Max: 96 %    EXAM:  General: No distress. Alert.  Eyes: PERRL. No sclera icterus. No conjunctival injection.  ENT: No discharge. Pharynx clear.  Neck: Trachea midline. Normal thyroid. No jvd, no hjr.   Resp: Diffuse wheezing.  No accessory muscle use. No rales. No rhonchi.   CV: Regular rate. Regular rhythm. No murmur No rub.    Abd: Non-tender. Non-distended. No masses. No  organmegaly. Normal bowel sounds.   Skin: Warm and dry. No nodule on exposed extremities. No rash on exposed extremities.  Lymph: No cervical LAD. No supraclavicular LAD.   Ext: No joint deformity. No clubbing. No cyanosis. No edema  Neuro: Awake. Follows commands. Positive pupils/gag/corneals. Normal pain response.     Lab Results:  CBC:   Recent Labs     11/11/18  1021   WBC 14.1*   HGB 15.1   HCT 44.7   MCV 86.1   PLT 236       BMP:  Recent Labs     11/11/18  1021   NA 135   K 4.3   CL 99   CO2 26   BUN 17   CREATININE 0.9    ALB:3,BILIDIR:3,BILITOT:3,ALKPHOS:3)@    PT/INR: No results for input(s): PROTIME, INR in the last 72 hours.    Cultures:  Sputum: not available  Blood: not available    ABG:   No results for input(s): PH, PO2, PCO2, HCO3, BE, O2SAT, METHB, O2HB, COHB, O2CON, HHB, THB in the last 72 hours.          Films:     XR CHEST (2 VW)   Final Result      Negative two-view chest.      XR CHEST (2 VW)    (Results Pending)   .        Assessment:  1. Asthma with status asthmaticus: Eosinophil mediated.  It is unclear with the patient's trigger or triggers are.      Plan:  1. Increased dose of inhaled corticosteroid  2. Increased dose of short acting beta agonist  3. Trial of EZ Pap  4. Continue IV steroids  5. Check zone 5 RAST testing  6. Check IgE total      Thanks for letting us see this patient in consultation.  Total time in reviewing the previous admissions and records, reviewing the current x-rays, labs, and discussing with clinical staff including nursing and physicians, exceeded 50 minutes.      Please contact us with any questions. Office [phone] or after hours through [employer], x [phone].    Please note that voice recognition technology was used in the preparation of this note and make therefore it may contain inadvertent transcription errors.  If the patient is a COVID 19 isolation patient, the above physical exam reflects that of the examining physician for the day.    Trey Sailors,   M.D., F.C.C.P.    [facility] [place], [address] [place], [place] [address]

## 2018-11-12 MED ORDER — STERILE WATER FOR INJECTION IJ SOLN
INTRAMUSCULAR | Status: AC
Start: 2018-11-12 — End: 2018-11-12
  Administered 2018-11-12: 07:00:00 10

## 2018-11-12 MED FILL — FAMOTIDINE 20 MG PO TABS: 20 MG | ORAL | Qty: 1

## 2018-11-12 MED FILL — METHYLPREDNISOLONE SODIUM SUCC 40 MG IJ SOLR: 40 MG | INTRAMUSCULAR | Qty: 40

## 2018-11-12 MED FILL — STERILE WATER FOR INJECTION IJ SOLN: INTRAMUSCULAR | Qty: 10

## 2018-11-12 MED FILL — NORMAL SALINE FLUSH 0.9 % IV SOLN: 0.9 % | INTRAVENOUS | Qty: 10

## 2018-11-12 MED FILL — ENOXAPARIN SODIUM 40 MG/0.4ML SC SOLN: 40 MG/0.4ML | SUBCUTANEOUS | Qty: 0.4

## 2018-11-12 NOTE — Progress Notes (Signed)
[facility] Progress Note    Admitting Date and Time: 11/05/2018  8:38 PM  Admit Dx: Asthma exacerbation attacks [J45.901]  Asthma exacerbation attacks [J45.901]  Asthma exacerbation attacks [J45.901]  Asthma exacerbation attacks [J45.901]    Subjective:  Patient is being followed for Asthma exacerbation attacks [J45.901]  Asthma exacerbation attacks [J45.901]  Asthma exacerbation attacks [J45.901]  Asthma exacerbation attacks [J45.901]   Pt feels short of breath when walking.  Still requiring O2 via NC    Per RN: Patient still getting extremely short of breath when walking.  He remains afebrile.    ROS: denies fever, chills, cp, sob, n/v, HA unless stated above.     ??? famotidine  20 mg Oral BID   ??? doxycycline (VIBRAMYCIN) IV  100 mg Intravenous Q12H   ??? budesonide  1,000 mcg Nebulization BID   ??? ipratropium-albuterol  1 ampule Inhalation Q4H   ??? sodium chloride flush  10 mL Intravenous 2 times per day   ??? enoxaparin  40 mg Subcutaneous Daily   ??? methylPREDNISolone  40 mg Intravenous Q6H   ??? Arformoterol Tartrate  15 mcg Nebulization BID     sodium chloride flush, 10 mL, PRN  acetaminophen, 650 mg, Q6H PRN    Or  acetaminophen, 650 mg, Q6H PRN  polyethylene glycol, 17 g, Daily PRN  promethazine, 12.5 mg, Q6H PRN    Or  ondansetron, 4 mg, Q6H PRN  guaiFENesin, 400 mg, 4x Daily PRN         Objective:    BP 116/69    Pulse 59    Temp 98.1 ??F (36.7 ??C) (Oral)    Resp 16    Ht 6' (1.829 m)    Wt 190 lb (86.2 kg)    SpO2 94%    BMI 25.77 kg/m??     General Appearance: alert and oriented to person, place and time and in no acute distress  Skin: warm and dry  Head: normocephalic and atraumatic  Eyes: pupils equal, round, and reactive to light, extraocular eye movements intact, conjunctivae normal  Neck: neck supple and non tender without mass   Pulmonary/Chest: clear to auscultation bilaterally- scattered wheezes, no rales or rhonchi, normal air movement, no respiratory distress  Cardiovascular: normal  rate, normal S1 and S2 and no carotid bruits  Abdomen: soft, non-tender, non-distended, normal bowel sounds, no masses or organomegaly  Extremities: no cyanosis, no clubbing and no edema  Neurologic: no cranial nerve deficit and speech normal        Recent Labs     11/11/18  1021   NA 135   K 4.3   CL 99   CO2 26   BUN 17   CREATININE 0.9   GLUCOSE 152*   CALCIUM 8.8       Recent Labs     11/11/18  1021   WBC 14.1*   RBC 5.19   HGB 15.1   HCT 44.7   MCV 86.1   MCH 29.1   MCHC 33.8   RDW 13.6   PLT 236   MPV 10.5       Radiology: CXR was normal on 7/22    Assessment:    Active Problems:    Asthma exacerbation attacks    Exacerbation of asthma    Elevated lactic acid level    Dehydration    Hyperglycemia  Resolved Problems:    * No resolved hospital problems. *      Plan:  1.  Acute asthma  exacerbation-there was daily improvement, until yesterday afternoon.  Appetite is good.     Patient was planned for discharge 7/27, however, he was noted to have worsening wheezing in the afternoon and hypoxia below 90%.  He was placed back on NC and immediately his O2 saturation improved.  Discharge held.  We will continue solumedrol, nebs and monitor.  Dr. Chana BodeProia was consulted since symptoms were still not improving despite usual treatment and lack of other signs/symptoms.  Today is Day 6 of hospitalization.      COVID negative  CXR on admission negative; repeat CXR this morning  WBC normal, afebrile, normotensive  Will add pepcid to cover for possible GERD  Await this AM's labs and will add procalcitonin  Discontinue abx.  Per pulmonology-RAST testing and IGE were ordered.    2.  Dehydration-resolved, discontinue IVF  3.  Hyperglycemia-likely secondary to steroids; continue to monitor    Can D/C once patient able to maintain O2 sats above 90% with activity.    NOTE: This report was transcribed using voice recognition software. Every effort was made to ensure accuracy; however, inadvertent computerized transcription errors may be  present.  Electronically signed by Charlesetta IvoryKELLY J Shakela Donati, MD on 11/12/2018 at 8:44 AM

## 2018-11-12 NOTE — Plan of Care (Signed)
Problem: Activity Intolerance:  Goal: Ability to perform activities of daily living will improve  Description: Ability to perform activities of daily living will improve  Outcome: Met This Shift     Problem: Airway Clearance - Ineffective:  Goal: Ability to maintain a clear airway will improve  Description: Ability to maintain a clear airway will improve  Outcome: Met This Shift     Problem: Gas Exchange - Impaired:  Goal: Levels of oxygenation will improve  Description: Levels of oxygenation will improve  11/12/2018 0909 by Park Meo Dell'Arco, RN  Outcome: Met This Shift     Problem: Pain:  Goal: Pain level will decrease  Description: Pain level will decrease  11/12/2018 0909 by Park Meo Dell'Arco, RN  Outcome: Met This Shift     Problem: Pain:  Goal: Control of acute pain  Description: Control of acute pain  11/12/2018 0909 by Park Meo Dell'Arco, RN  Outcome: Met This Shift     Problem: Pain:  Goal: Control of chronic pain  Description: Control of chronic pain  Outcome: Met This Shift     Problem: Falls - Risk of:  Goal: Will remain free from falls  Description: Will remain free from falls  Outcome: Met This Shift     Problem: Falls - Risk of:  Goal: Absence of physical injury  Description: Absence of physical injury  Outcome: Met This Shift     Problem: Anxiety:  Goal: Level of anxiety will decrease  Description: Level of anxiety will decrease  Outcome: Met This Shift     Problem: Discharge Planning:  Goal: Discharged to appropriate level of care  Description: Discharged to appropriate level of care  Outcome: Not Met This Shift

## 2018-11-12 NOTE — Progress Notes (Signed)
[facility] Quality Flow/Interdisciplinary Rounds Progress Note        Quality Flow Rounds held on November 12, 2018    Disciplines Attending:  Bedside Nurse, Social Worker, Case Manager and Nursing Unit Leadership    Ray Rodgers was admitted on 11/05/2018  8:38 PM    Anticipated Discharge Date:  Expected Discharge Date: 11/07/18    Disposition:    Braden Score:  Braden Scale Score: 20    Readmission Risk              Risk of Unplanned Readmission:        14           Discussed patient goal for the day, patient clinical progression, and barriers to discharge.  The following Goal(s) of the Day/Commitment(s) have been identified:  Discharge Planning      [facility] [employer] Parslow  November 12, 2018

## 2018-11-12 NOTE — Plan of Care (Signed)
Problem: Activity Intolerance:  Goal: Ability to perform activities of daily living will improve  Description: Ability to perform activities of daily living will improve  11/12/2018 1957 by Trula Slade, RN  Outcome: Met This Shift     Problem: Gas Exchange - Impaired:  Goal: Levels of oxygenation will improve  Description: Levels of oxygenation will improve  11/12/2018 1957 by Trula Slade, RN  Outcome: Met This Shift

## 2018-11-12 NOTE — Care Coordination-Inpatient (Addendum)
COVID negative 7/22. Continues on iv steroid. O2 3lNC- O2 testing completed yesterday-DOES NOT qualify for home O2. NO PCP- virtual appointment scheduled for 8/3 @ 10am. Will follow Phill Mutter case manager

## 2018-11-12 NOTE — Progress Notes (Signed)
Pulmonary Progress Note    Admit Date: 11/05/2018  Hospital day                               PCP: No primary care provider on file.    Chief Complaint (s):  Patient Active Problem List   Diagnosis   ??? Mild persistent asthma without complication   ??? Former smoker   ??? Asthma exacerbation attacks   ??? Exacerbation of asthma   ??? Elevated lactic acid level   ??? Dehydration   ??? Hyperglycemia       Subjective:  ?? Awake and alert, breathing somewhat better with the change in nebulizer solutions.  The patient notes that he does have a nebulizer at home but he has no tubing and medications for it.  He has not used it in some time.      Vitals:  VITALS:  BP 125/74    Pulse 65    Temp 98.5 ??F (36.9 ??C) (Oral)    Resp 16    Ht 6' (1.829 m)    Wt 190 lb (86.2 kg)    SpO2 95%    BMI 25.77 kg/m??     24HR INTAKE/OUTPUT:      Intake/Output Summary (Last 24 hours) at 11/13/2018 0856  Last data filed at 11/12/2018 1230  Gross per 24 hour   Intake 300 ml   Output --   Net 300 ml       24HR PULSE OXIMETRY RANGE:    SpO2  Avg: 93.6 %  Min: 93 %  Max: 95 %    Medications:  IV:      Scheduled Meds:  ??? famotidine  20 mg Oral BID   ??? budesonide  1,000 mcg Nebulization BID   ??? ipratropium-albuterol  1 ampule Inhalation Q4H   ??? sodium chloride flush  10 mL Intravenous 2 times per day   ??? enoxaparin  40 mg Subcutaneous Daily   ??? methylPREDNISolone  40 mg Intravenous Q6H   ??? Arformoterol Tartrate  15 mcg Nebulization BID       Diet:   DIET GENERAL;     EXAM:  General: No distress. Alert.  Eyes: PERRL. No sclera icterus. No conjunctival injection.  ENT: No discharge. Pharynx clear.  Neck: Trachea midline. Normal thyroid.  Resp: No accessory muscle use. No rales. Diffuse wheezing. No rhonchi.   CV: Regular rate. Regular rhythm. No murmur or rub.   Abd: Non-tender. Non-distended. No masses. No organomegaly. Normal bowel sounds.   Skin: Warm and dry. No nodule on exposed extremities. No rash on exposed extremities.  Ext: No cyanosis,  clubbing, edema  Lymph: No cervical LAD. No supraclavicular LAD.   M/S: No cyanosis. No joint deformity. No clubbing.   Neuro: Awake. Follows commands. Positive pupils/gag/corneals. Normal pain response.       Results:  CBC:   Recent Labs     11/11/18  1021   WBC 14.1*   HGB 15.1   HCT 44.7   MCV 86.1   PLT 236     BMP:   Recent Labs     11/11/18  1021   NA 135   K 4.3   CL 99   CO2 26   BUN 17   CREATININE 0.9     LIVER PROFILE:   Recent Labs     11/11/18  1021   AST 13   ALT 30   BILITOT 0.3  ALKPHOS 95     PT/INR: No results for input(s): PROTIME, INR in the last 72 hours.  APTT: No results for input(s): APTT in the last 72 hours.      Pathology:  1. N/A      Microbiology:  1. None    Recent ABG:   No results for input(s): PH, PO2, PCO2, HCO3, BE, O2SAT, METHB, O2HB, COHB, O2CON, HHB, THB in the last 72 hours.          Recent Films:  XR CHEST (2 VW)   Final Result   No acute cardiopulmonary process.      XR CHEST (2 VW)   Final Result      Negative two-view chest.        Assessment:  1. Asthma with status asthmaticus: Eosinophil mediated.  It is unclear with the patient's trigger or triggers are.  ??  ??  Plan:  1. Continue EZ Pap  2. Continue IV steroids  3. Check zone 5 RAST testing, sent and pending  4. Check IgE total  5. Home in 48 hours.  ??    Time at the bedside, reviewing labs and radiographs, reviewing updated notes and consultations, discussing with staff and family was more than 25 minutes.    Please note that voice recognition technology was used in the preparation of this note and make therefore it may contain inadvertent transcription errors.  If the patient is a COVID 19 isolation patient, the above physical exam reflects that of the examining physician for the day.        Trey Sailors,  M.D., F.C.C.P.    [facility] [place], [address] [place], [place] [address]

## 2018-11-13 MED ORDER — IPRATROPIUM-ALBUTEROL 0.5-2.5 (3) MG/3ML IN SOLN
RESPIRATORY_TRACT | 1 refills | Status: DC
Start: 2018-11-13 — End: 2020-08-20

## 2018-11-13 MED ORDER — BROVANA 15 MCG/2ML IN NEBU
15 | Freq: Two times a day (BID) | RESPIRATORY_TRACT | 0 refills | Status: DC
Start: 2018-11-13 — End: 2019-09-16

## 2018-11-13 MED ORDER — BUDESONIDE 0.25 MG/2ML IN SUSP
0.25 MG/2ML | INJECTION | Freq: Two times a day (BID) | RESPIRATORY_TRACT | 0 refills | Status: DC
Start: 2018-11-13 — End: 2020-08-20

## 2018-11-13 MED ORDER — PREDNISONE 10 MG PO TABS
10 MG | ORAL_TABLET | ORAL | 0 refills | Status: DC
Start: 2018-11-13 — End: 2019-09-16

## 2018-11-13 MED FILL — MUCUS RELIEF 400 MG PO TABS: 400 MG | ORAL | Qty: 1

## 2018-11-13 MED FILL — METHYLPREDNISOLONE SODIUM SUCC 40 MG IJ SOLR: 40 MG | INTRAMUSCULAR | Qty: 40

## 2018-11-13 MED FILL — NORMAL SALINE FLUSH 0.9 % IV SOLN: 0.9 % | INTRAVENOUS | Qty: 50

## 2018-11-13 MED FILL — NORMAL SALINE FLUSH 0.9 % IV SOLN: 0.9 % | INTRAVENOUS | Qty: 10

## 2018-11-13 MED FILL — FAMOTIDINE 20 MG PO TABS: 20 MG | ORAL | Qty: 1

## 2018-11-13 MED FILL — ENOXAPARIN SODIUM 40 MG/0.4ML SC SOLN: 40 MG/0.4ML | SUBCUTANEOUS | Qty: 0.4

## 2018-11-13 NOTE — Plan of Care (Signed)
Problem: Activity Intolerance:  Goal: Ability to perform activities of daily living will improve  Description: Ability to perform activities of daily living will improve  11/13/2018 0813 by Romie Levee, RN  Outcome: Met This Shift  11/12/2018 1957 by Trula Slade, RN  Outcome: Met This Shift     Problem: Airway Clearance - Ineffective:  Goal: Ability to maintain a clear airway will improve  Description: Ability to maintain a clear airway will improve  Outcome: Met This Shift     Problem: Gas Exchange - Impaired:  Goal: Levels of oxygenation will improve  Description: Levels of oxygenation will improve  11/13/2018 0813 by Romie Levee, RN  Outcome: Met This Shift  11/12/2018 1957 by Trula Slade, RN  Outcome: Met This Shift     Problem: Pain:  Goal: Pain level will decrease  Description: Pain level will decrease  Outcome: Met This Shift     Problem: Falls - Risk of:  Goal: Will remain free from falls  Description: Will remain free from falls  Outcome: Met This Shift     Problem: Anxiety:  Goal: Level of anxiety will decrease  Description: Level of anxiety will decrease  Outcome: Met This Shift

## 2018-11-13 NOTE — Progress Notes (Signed)
Pulmonary Progress Note    Admit Date: 11/05/2018  Hospital day                               PCP: No primary care provider on file.    Chief Complaint (s):  Patient Active Problem List   Diagnosis   ??? Mild persistent asthma without complication   ??? Former smoker   ??? Asthma exacerbation attacks   ??? Exacerbation of asthma   ??? Elevated lactic acid level   ??? Dehydration   ??? Hyperglycemia       Subjective:  ?? Awake and alert, breathing is finally back to normal.  He is ready for discharge.      Vitals:  VITALS:  BP 125/74    Pulse 65    Temp 98.5 ??F (36.9 ??C) (Oral)    Resp 16    Ht 6' (1.829 m)    Wt 190 lb (86.2 kg)    SpO2 95%    BMI 25.77 kg/m??     24HR INTAKE/OUTPUT:      Intake/Output Summary (Last 24 hours) at 11/13/2018 0950  Last data filed at 11/12/2018 1230  Gross per 24 hour   Intake 300 ml   Output --   Net 300 ml       24HR PULSE OXIMETRY RANGE:    SpO2  Avg: 93.6 %  Min: 93 %  Max: 95 %    Medications:  IV:      Scheduled Meds:  ??? famotidine  20 mg Oral BID   ??? budesonide  1,000 mcg Nebulization BID   ??? ipratropium-albuterol  1 ampule Inhalation Q4H   ??? sodium chloride flush  10 mL Intravenous 2 times per day   ??? enoxaparin  40 mg Subcutaneous Daily   ??? methylPREDNISolone  40 mg Intravenous Q6H   ??? Arformoterol Tartrate  15 mcg Nebulization BID       Diet:   DIET GENERAL;     EXAM:  General: No distress. Alert.  Eyes: PERRL. No sclera icterus. No conjunctival injection.  ENT: No discharge. Pharynx clear.  Neck: Trachea midline. Normal thyroid.  Resp: No accessory muscle use. No rales.  No wheezing. No rhonchi.   CV: Regular rate. Regular rhythm. No murmur or rub.   Abd: Non-tender. Non-distended. No masses. No organomegaly. Normal bowel sounds.   Skin: Warm and dry. No nodule on exposed extremities. No rash on exposed extremities.  Ext: No cyanosis, clubbing, edema  Lymph: No cervical LAD. No supraclavicular LAD.   M/S: No cyanosis. No joint deformity. No clubbing.   Neuro: Awake. Follows  commands. Positive pupils/gag/corneals. Normal pain response.       Results:  CBC:   Recent Labs     11/11/18  1021   WBC 14.1*   HGB 15.1   HCT 44.7   MCV 86.1   PLT 236     BMP:   Recent Labs     11/11/18  1021   NA 135   K 4.3   CL 99   CO2 26   BUN 17   CREATININE 0.9     LIVER PROFILE:   Recent Labs     11/11/18  1021   AST 13   ALT 30   BILITOT 0.3   ALKPHOS 95     PT/INR: No results for input(s): PROTIME, INR in the last 72 hours.  APTT: No results for input(s):  APTT in the last 72 hours.       ??    Pathology:  1. N/A      Microbiology:  1. None    Recent ABG:   No results for input(s): PH, PO2, PCO2, HCO3, BE, O2SAT, METHB, O2HB, COHB, O2CON, HHB, THB in the last 72 hours.          Recent Films:  XR CHEST (2 VW)   Final Result   No acute cardiopulmonary process.      XR CHEST (2 VW)   Final Result      Negative two-view chest.        Assessment:  1. Asthma with status asthmaticus: Eosinophil mediated.  It is unclear with the patient's trigger or triggers are.  ??  ??  Plan:  1. Continue EZ Pap  2. Continue IV steroids  3. Check zone 5 RAST testing, sent and pending  4. Check IgE total  5. Okay to discharge on the following: Prednisone taper 40 mg for 3 days, 30 mg for 3 days, 20 mg for 3 days and then 10 mg for 3 days.  Arformoterol via nebulization twice daily.  Budesonide 0.5 mg via nebulization twice daily.  DuoNeb every 4-6 hours as needed.  6. Follow-up in the office for work-up for Biologics  ??    Time at the bedside, reviewing labs and radiographs, reviewing updated notes and consultations, discussing with staff and family was more than 25 minutes.    Please note that voice recognition technology was used in the preparation of this note and make therefore it may contain inadvertent transcription errors.  If the patient is a COVID 19 isolation patient, the above physical exam reflects that of the examining physician for the day.        Ray Rodgers,  M.D., F.C.C.P.    [facility]  [place], [address], [place], [place] [address]

## 2018-11-13 NOTE — Care Coordination-Inpatient (Signed)
O2 weaned to room air- O2 sat 95%. Has nebulizer at home. Iv steroid transitioned to oral per pulmonary. Plan DC home today-awaiting medical clearance. For virtual PCP appointment on 8/3 @ 10am- info on DC instructions- pt informed. Phill Mutter case manager

## 2018-11-13 NOTE — Discharge Summary (Addendum)
[facility]       Hospitalist Physician Discharge Summary       Daisey Must, MD  [place]  [place] [address]  [phone]          Ashley Baroff-Rufo, MD  [place]  [place] [address]  [phone]    Call on 11/17/2018  to establish and for follow up of your asthma    Daisey Must, MD  [place]  [place] [address]  [phone]      APPT OVER THE PHONE-DUE TO COVID-ON AUGUST 3TH-10:00 A.M.--CALL [phone] TO PRE-REGISTER FOR THE APPT    Trey Sailors, MD  [place]  [place]  [place] [address]  [phone]            Activity level: As tolerated    Diet: DIET GENERAL;    Dispo:Home with self-care    Condition on discharge-stable    Patient ID:  Ray Rodgers  [id number]  37 y.o.  10-15-1980    Admit date: 11/05/2018    Discharge date and time:  11/13/2018  11:41 AM    Admission Diagnoses: Active Problems:    Asthma exacerbation attacks    Exacerbation of asthma    Elevated lactic acid level    Dehydration    Hyperglycemia  Resolved Problems:    * No resolved hospital problems. *      Discharge Diagnoses: Active Problems:    Asthma exacerbation attacks    Exacerbation of asthma    Elevated lactic acid level    Dehydration    Hyperglycemia  Resolved Problems:    * No resolved hospital problems. *      Consults:  IP CONSULT TO PULMONOLOGY      Hospital Course:   He is a 38 year old male with past medical history of asthma came to ER with increasing short of breath, dry cough and wheezing.  He says he just moved from [place] to help his mom over here-he ran out of medications.  He could not find a PCP over here to refill the meds so he came to ER.  Denied any fever  /Chills.  On admission his chest x-ray was negative.  He was given IV steroids, O2  and nebulized treatments in ER and admitted on regular nursing floor.  After few days he improved-but right prior to discharge, he started with increasing  short of breath and wheezing so pulmonary was consulted.  His IV steroids was increased.  Brovana and Pulmicort were added.  RAST and IgE levels were ordered- results are pending since it is send out.  After this he improved and at present he is maintaining good sats on room air, short of breath and wheezing has resolved.  He is COVID negative and his pro-Cal was also normal.  He is  discharged on ,taper prednisone  duo nebs, Pulmicort inhalations, Brovana .  On discharge his O2 requirement was assessed and he does not need supplemental oxygen.  He will follow with PCP/pulmonary as outpatient as mentioned above.    Discharge Exam:  Vitals:    11/12/18 1315 11/12/18 1745 11/12/18 1915 11/13/18 0715   BP:   133/74 125/74   Pulse: 93 90 91 65   Resp:   16 16   Temp:   99 ??F (37.2 ??C) 98.5 ??F (36.9 ??C)   TempSrc:   Oral Oral   SpO2: 93% 94% 93% 95%  Weight:       Height:           For PE see my progress note dated today     LABS:  Recent Labs     11/11/18  1021   NA 135   K 4.3   CL 99   CO2 26   BUN 17   CREATININE 0.9   GLUCOSE 152*   CALCIUM 8.8       Recent Labs     11/11/18  1021   WBC 14.1*   RBC 5.19   HGB 15.1   HCT 44.7   MCV 86.1   MCH 29.1   MCHC 33.8   RDW 13.6   PLT 236   MPV 10.5       No results for input(s): POCGLU in the last 72 hours.        Imaging:   XR CHEST (2 VW)   Final Result   No acute cardiopulmonary process.      XR CHEST (2 VW)   Final Result      Negative two-view chest.          Patient Instructions:      Medication List      START taking these medications    Brovana 15 MCG/2ML Nebu  Generic drug:  Arformoterol Tartrate  Take 1 ampule by nebulization 2 times daily     budesonide 0.25 MG/2ML nebulizer suspension  Commonly known as:  Pulmicort  Take 8 mLs by nebulization 2 times daily     guaiFENesin 400 MG tablet  Take 1 tablet by mouth 4 times daily as needed for Cough     predniSONE 10 MG tablet  Commonly known as:  DELTASONE  4 tabs x 3 days, then 3 tabs x 3 days, then 2 tabs x 3 days,  then 1 tab x 3 days.        CHANGE how you take these medications    albuterol sulfate HFA 108 (90 Base) MCG/ACT inhaler  Commonly known as:  ProAir HFA  Inhale 2 puffs into the lungs every 4 hours as needed for Wheezing  What changed:    ?? when to take this  ?? Another medication with the same name was removed. Continue taking this medication, and follow the directions you see here.        CONTINUE taking these medications    ipratropium-albuterol 0.5-2.5 (3) MG/3ML Soln nebulizer solution  Commonly known as:  DUONEB  Inhale 3 mLs into the lungs every 4 hours        STOP taking these medications    Advair Diskus 100-50 MCG/DOSE diskus inhaler  Generic drug:  fluticasone-salmeterol     Nebulizer/Tubing/Mouthpiece Kit           Where to Get Your Medications      These medications were sent to [facility] [place], [place]  [address] [place] [address]    Phone:  [phone]   ?? albuterol sulfate HFA 108 (90 Base) MCG/ACT inhaler  ?? Brovana 15 MCG/2ML Nebu  ?? budesonide 0.25 MG/2ML nebulizer suspension  ?? guaiFENesin 400 MG tablet  ?? ipratropium-albuterol 0.5-2.5 (3) MG/3ML Soln nebulizer solution  ?? predniSONE 10 MG tablet       Pharmacy called to clarify  prescription - pulmicort changed to 1000 mcg bid - similar to what he was receiving during hospital stay.    Note  that more than 30 minutes was spent in preparing discharge papers, discussing discharge with patient, medication review, etc.    Signed:  Electronically signed by Ludwig Clarks, MD on 11/13/2018 at 11:41 AM    NOTE: This report was transcribed using voice recognition software. Every effort was made to ensure accuracy; however, inadvertent computerized transcription errors may be present.

## 2018-11-13 NOTE — Progress Notes (Signed)
Nutrition Assessment     Type and Reason for Visit: Initial, RD Nutrition Re-Screen/LOS(LOS Assessment, RD Re-Screen Negative)     Nutrition Assessment:  Pt assessed per LOS protocol.  Pt currently eating ~76-100% of most meals and w/ no other significant nutritional issues noted at this time.  Will follow-up per policy.  Please consult if RD needed.    Electronically signed by Karen Kitchens, RD, LD on 11/13/18 at 11:49 AM EDT    Contact: ext [phone]

## 2018-11-13 NOTE — Progress Notes (Signed)
[facility]   Progress Note    Admitting Date and Time: 11/05/2018  8:38 PM  Admit Dx: Asthma exacerbation attacks [J45.901]  Asthma exacerbation attacks [J45.901]  Asthma exacerbation attacks [J45.901]  Asthma exacerbation attacks [J45.901]     Seen for follow on Asthma exacerbation    Subjective:  Much better , improving sob , denies any overt wheezing episode. No CP or abd pain. D/w nursing.    ROS: denies fever, chills, cp,n/v, HA unless stated above.    ??? famotidine  20 mg Oral BID   ??? budesonide  1,000 mcg Nebulization BID   ??? ipratropium-albuterol  1 ampule Inhalation Q4H   ??? sodium chloride flush  10 mL Intravenous 2 times per day   ??? enoxaparin  40 mg Subcutaneous Daily   ??? methylPREDNISolone  40 mg Intravenous Q6H   ??? Arformoterol Tartrate  15 mcg Nebulization BID     sodium chloride flush, 10 mL, PRN  acetaminophen, 650 mg, Q6H PRN    Or  acetaminophen, 650 mg, Q6H PRN  polyethylene glycol, 17 g, Daily PRN  promethazine, 12.5 mg, Q6H PRN    Or  ondansetron, 4 mg, Q6H PRN  guaiFENesin, 400 mg, 4x Daily PRN         Objective:    BP 125/74    Pulse 65    Temp 98.5 ??F (36.9 ??C) (Oral)    Resp 16    Ht 6' (1.829 m)    Wt 190 lb (86.2 kg)    SpO2 95%    BMI 25.77 kg/m??   General Appearance: alert and oriented to person, place and time, well developed and well- nourished, in no acute distress  Skin: warm and dry, no rash or erythema  Head: normocephalic and atraumatic  Eyes: pupils equal, round, and reactive to light, extraocular eye movements intact, conjunctivae normal  Neck: supple and non-tender without mass  Pulmonary/Chest: clear to auscultation bilaterally- few occassional ronchii on deep inspiration   Cardiovascular: normal rate, regular rhythm, normal S1 and S2  Abdomen: soft, non-tender, non-distended, normal bowel sounds, no masses or organomegaly  Extremities: no cyanosis, clubbing  Musculoskeletal: normal range of motion  Neurologic:  no cranial nerve deficit,speech  normal      Recent Labs     11/11/18  1021   NA 135   K 4.3   CL 99   CO2 26   BUN 17   CREATININE 0.9   GLUCOSE 152*   CALCIUM 8.8       Recent Labs     11/11/18  1021   WBC 14.1*   RBC 5.19   HGB 15.1   HCT 44.7   MCV 86.1   MCH 29.1   MCHC 33.8   RDW 13.6   PLT 236   MPV 10.5       Labs and images reviewed     Radiology:   XR CHEST (2 VW)   Final Result   No acute cardiopulmonary process.      XR CHEST (2 VW)   Final Result      Negative two-view chest.          Assessment:    Active Problems:    Asthma exacerbation attacks    Exacerbation of asthma    Elevated lactic acid level    Dehydration    Hyperglycemia  Resolved Problems:    * No resolved hospital problems. *      Plan:  Asthma exacerbation -  on IV steroids , nebs rxs ,O2 as needed and other supportive rx.CXR on admission as well as repeat neg.Procal neg , Covid neg.Pulm following.RAST & IGE pending.    Dehydration - resolved   Hyperglycemia sec to steroids , monitor.        Electronically signed by Bonita Quin, MD on 11/13/2018 at 8:05 AM

## 2018-11-13 NOTE — Other (Signed)
[facility] Quality Flow/Interdisciplinary Rounds Progress Note        Quality Flow Rounds held on November 13, 2018    Disciplines Attending:  Bedside Nurse, Social Worker, Case Manager and Nursing Unit Leadership    Ray Rodgers was admitted on 11/05/2018  8:38 PM    Anticipated Discharge Date:  Expected Discharge Date: 11/07/18    Disposition:    Braden Score:  Braden Scale Score: 20    Readmission Risk              Risk of Unplanned Readmission:        14           Discussed patient goal for the day, patient clinical progression, and barriers to discharge.  The following Goal(s) of the Day/Commitment(s) have been identified:  Likely discharge home today with adequate oxygen saturation.      [name]  November 13, 2018

## 2018-11-17 ENCOUNTER — Encounter: Attending: Family Medicine

## 2018-11-19 ENCOUNTER — Ambulatory Visit: Admit: 2018-11-19 | Discharge: 2018-11-19 | Payer: PRIVATE HEALTH INSURANCE | Attending: Acute Care

## 2018-11-19 DIAGNOSIS — J4550 Severe persistent asthma, uncomplicated: Secondary | ICD-10-CM

## 2018-11-19 LAB — IGE: IgE: 1348 kU/L — ABNORMAL HIGH (ref <=214)

## 2018-11-19 NOTE — Patient Instructions (Signed)
Please work up for starting [employer]

## 2018-11-19 NOTE — Progress Notes (Signed)
Pulmonary Progress Note                               PCP: No primary care provider on file.    Chief Complaint (s):  Chief Complaint   Patient presents with   ??? Follow-Up from Hospital         Subjective:  ?? Pt presents to the office today for a hospital FU for severe persistent asthma that he has had his entire life. He is especially bad this year and he is unsure why. He takes Advair daily and duoneb PRN. He still has 4 days of prednisone taper left.  a prednisone taper. He has no CO wheeze or cough.       Vitals:  VITALS:  BP 124/70    Pulse 68    Temp 98.7 ??F (37.1 ??C)    Resp 18    Ht 6' (1.829 m)    Wt 188 lb (85.3 kg)    SpO2 96% Comment: room air resting   BMI 25.50 kg/m??     24HR INTAKE/OUTPUT:    No intake or output data in the 24 hours ending 11/19/18 1351    24HR PULSE OXIMETRY RANGE:    @FLOWSTAT (10:24)@    Medications:  Current Outpatient Medications   Medication Sig Dispense Refill   ??? fluticasone-salmeterol (ADVAIR) 250-50 MCG/DOSE AEPB Inhale 1 puff into the lungs every 12 hours     ??? budesonide (PULMICORT) 0.25 MG/2ML nebulizer suspension Take 8 mLs by nebulization 2 times daily 60 ampule 0   ??? Arformoterol Tartrate (BROVANA) 15 MCG/2ML NEBU Take 1 ampule by nebulization 2 times daily 120 mL 0   ??? predniSONE (DELTASONE) 10 MG tablet 4 tabs x 3 days, then 3 tabs x 3 days, then 2 tabs x 3 days, then 1 tab x 3 days. 30 tablet 0   ??? ipratropium-albuterol (DUONEB) 0.5-2.5 (3) MG/3ML SOLN nebulizer solution Inhale 3 mLs into the lungs every 4 hours 360 mL 1   ??? guaiFENesin 400 MG tablet Take 1 tablet by mouth 4 times daily as needed for Cough 56 tablet 0   ??? albuterol sulfate HFA (PROAIR HFA) 108 (90 Base) MCG/ACT inhaler Inhale 2 puffs into the lungs every 4 hours as needed for Wheezing 1 Inhaler 2     No current facility-administered medications for this visit.        IV:      Scheduled Meds:      Diet:   No diet orders on file     Allergies  Patient has no known allergies.  History    Past Medical History:   Diagnosis Date   ??? Asthma      Social History     Socioeconomic History   ??? Marital status: Single     Spouse name: Not on file   ??? Number of children: Not on file   ??? Years of education: Not on file   ??? Highest education level: Not on file   Occupational History   ??? Not on file   Social Needs   ??? Financial resource strain: Not on file   ??? Food insecurity     Worry: Not on file     Inability: Not on file   ??? Transportation needs     Medical: Not on file     Non-medical: Not on file   Tobacco Use   ??? Smoking status: Former Smoker  Types: Cigarettes     Last attempt to quit: 10/21/2017     Years since quitting: 1.0   ??? Smokeless tobacco: Never Used   ??? Tobacco comment: 1-2 cigarettes per day    Substance and Sexual Activity   ??? Alcohol use: Never     Frequency: Never   ??? Drug use: Never   ??? Sexual activity: Not on file   Lifestyle   ??? Physical activity     Days per week: Not on file     Minutes per session: Not on file   ??? Stress: Not on file   Relationships   ??? Social [employer]     Talks on phone: Not on file     Gets together: Not on file     Attends religious service: Not on file     Active member of club or organization: Not on file     Attends meetings of clubs or organizations: Not on file     Relationship status: Not on file   ??? Intimate partner violence     Fear of current or ex partner: Not on file     Emotionally abused: Not on file     Physically abused: Not on file     Forced sexual activity: Not on file   Other Topics Concern   ??? Not on file   Social History Narrative   ??? Not on file     No family history on file.    EXAM:  General: No distress. Alert.  Eyes: PERRL. No sclera icterus. No conjunctival injection.  ENT: No discharge. Pharynx clear.  Neck: Trachea midline. Normal thyroid.  Resp: No accessory muscle use. no rales. no wheezing. no rhonchi.   CV: Regular rate. Regular rhythm. No murmur or rub.   Abd: Non-tender. Non-distended. No masses. No organomegaly. Normal bowel  sounds.   Skin: Warm and dry. No nodule on exposed extremities. No rash on exposed extremities.  Ext: No cyanosis, clubbing, edema  Lymph: No cervical LAD. No supraclavicular LAD.   M/S: No cyanosis. No joint deformity. No clubbing.   Neuro: Awake. Follows commands. Positive pupils/gag/corneals. Normal pain response.       Results:  CBC: No results for input(s): WBC, HGB, HCT, MCV, PLT in the last 72 hours.  BMP: No results for input(s): NA, K, CL, CO2, PHOS, BUN, CREATININE in the last 72 hours.    Invalid input(s): CA  LIVER PROFILE: No results for input(s): AST, ALT, LIPASE, BILIDIR, BILITOT, ALKPHOS in the last 72 hours.    Invalid input(s):  AMYLASE,  ALB  PT/INR: No results for input(s): PROTIME, INR in the last 72 hours.  APTT: No results for input(s): APTT in the last 72 hours.      Pathology:  1. N/A      Microbiology:  1. None    Recent ABG:   No results for input(s): PH, PO2, PCO2, HCO3, BE, O2SAT, METHB, O2HB, COHB, O2CON, HHB, THB in the last 72 hours.          Recent Films:  No orders to display       Assessment:  1. 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     Plan:  1. Cont Advair  2. Cont prednisone taper  3. Start work up for [facility]   4. FU after [facility] start    Care reviewed with the staff and the patient's family as available.     Please note that voice recognition technology was  used in the preparation of this note and make therefore it may contain inadvertent transcription errors.      Truitt Leep, APRN - CNP      [facility]    [facility], [address], [address], [place], [place] [address]

## 2018-11-20 IMAGING — DX DG CHEST 2V
2 series · 2 of 2 positions shown · non-contrast
Comparison: Chest radiograph performed 11/10/2016

CLINICAL DATA: Acute onset of cough, congestion and shortness of
breath. Generalized chest pain. Initial encounter.

EXAM:
CHEST  2 VIEW

[chest pa]
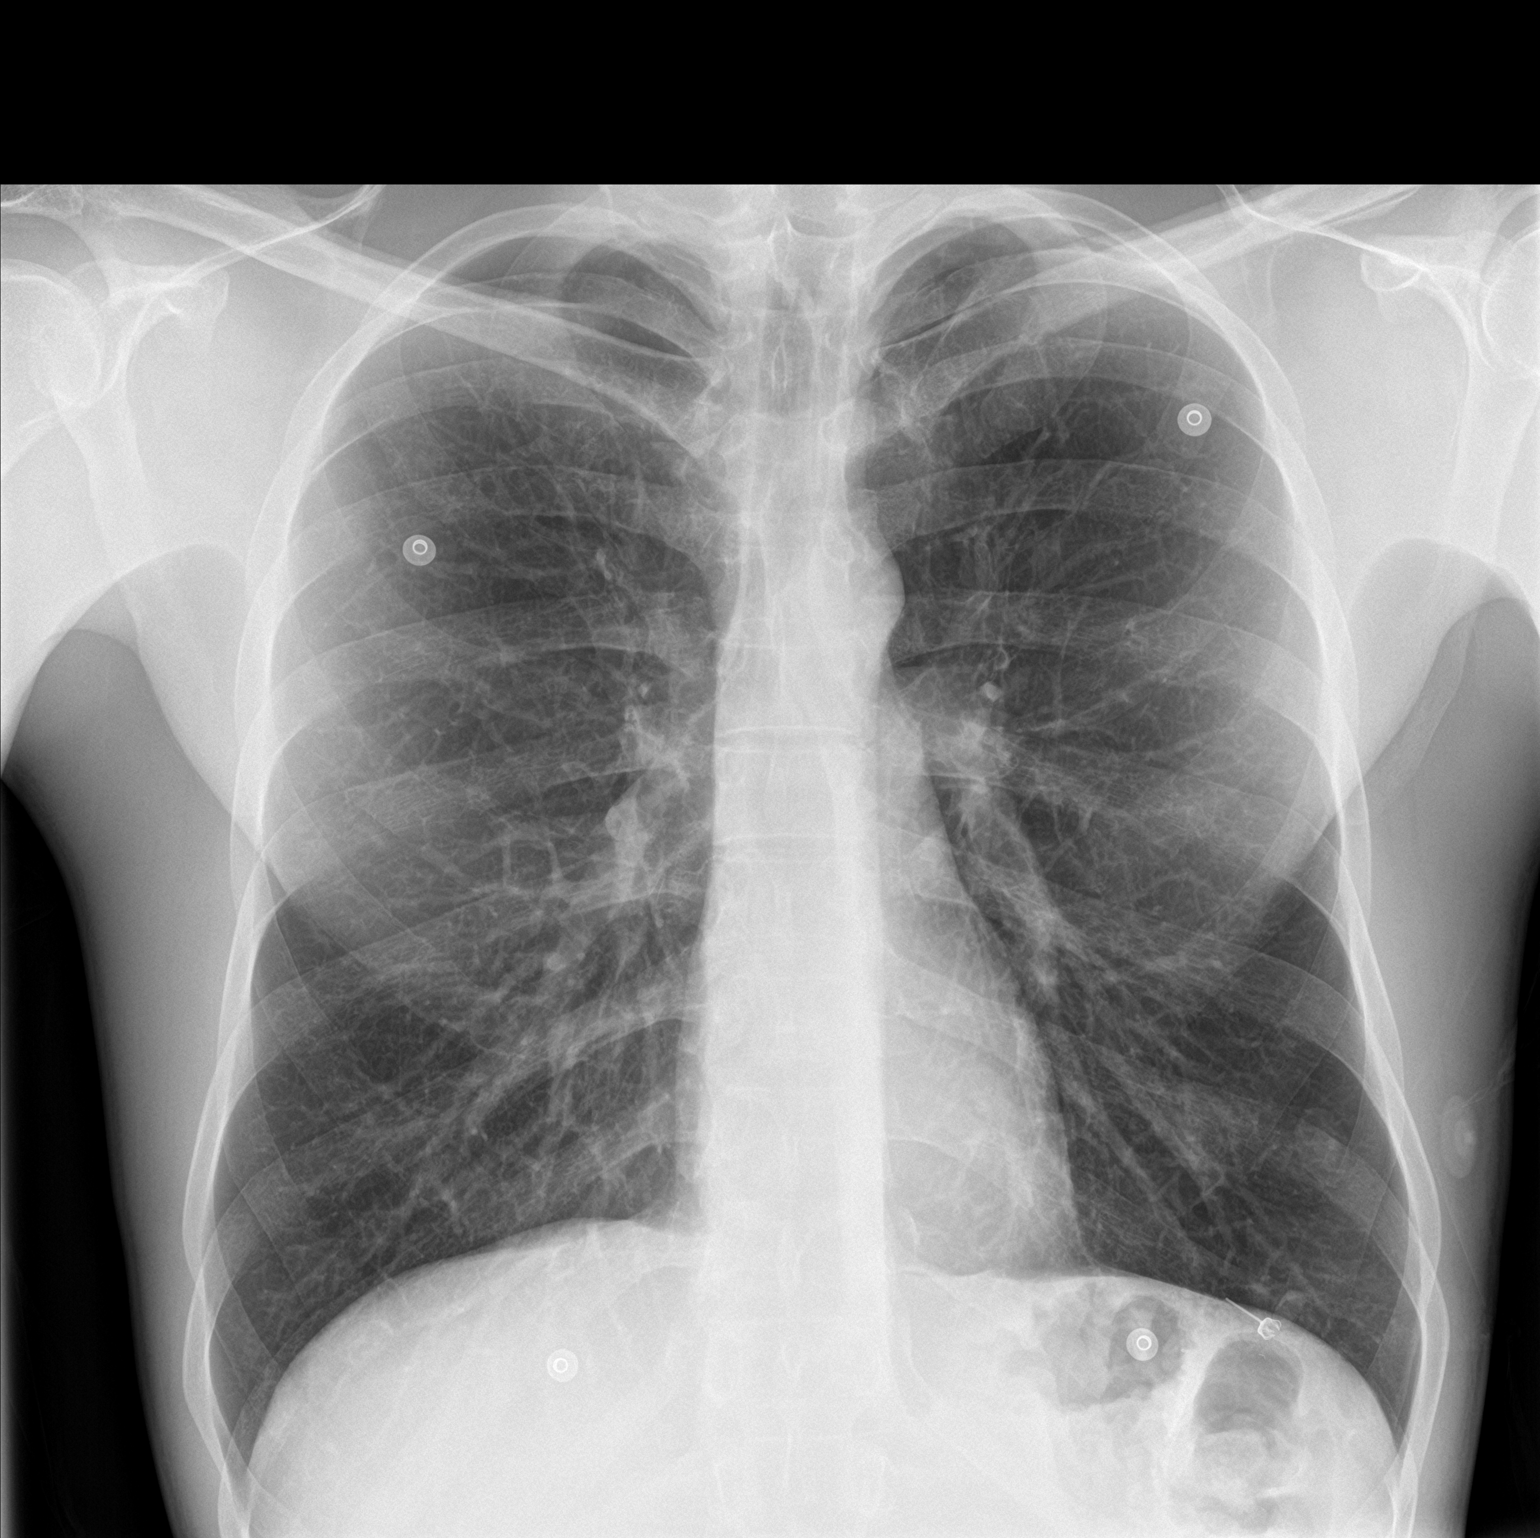

[chest lat]
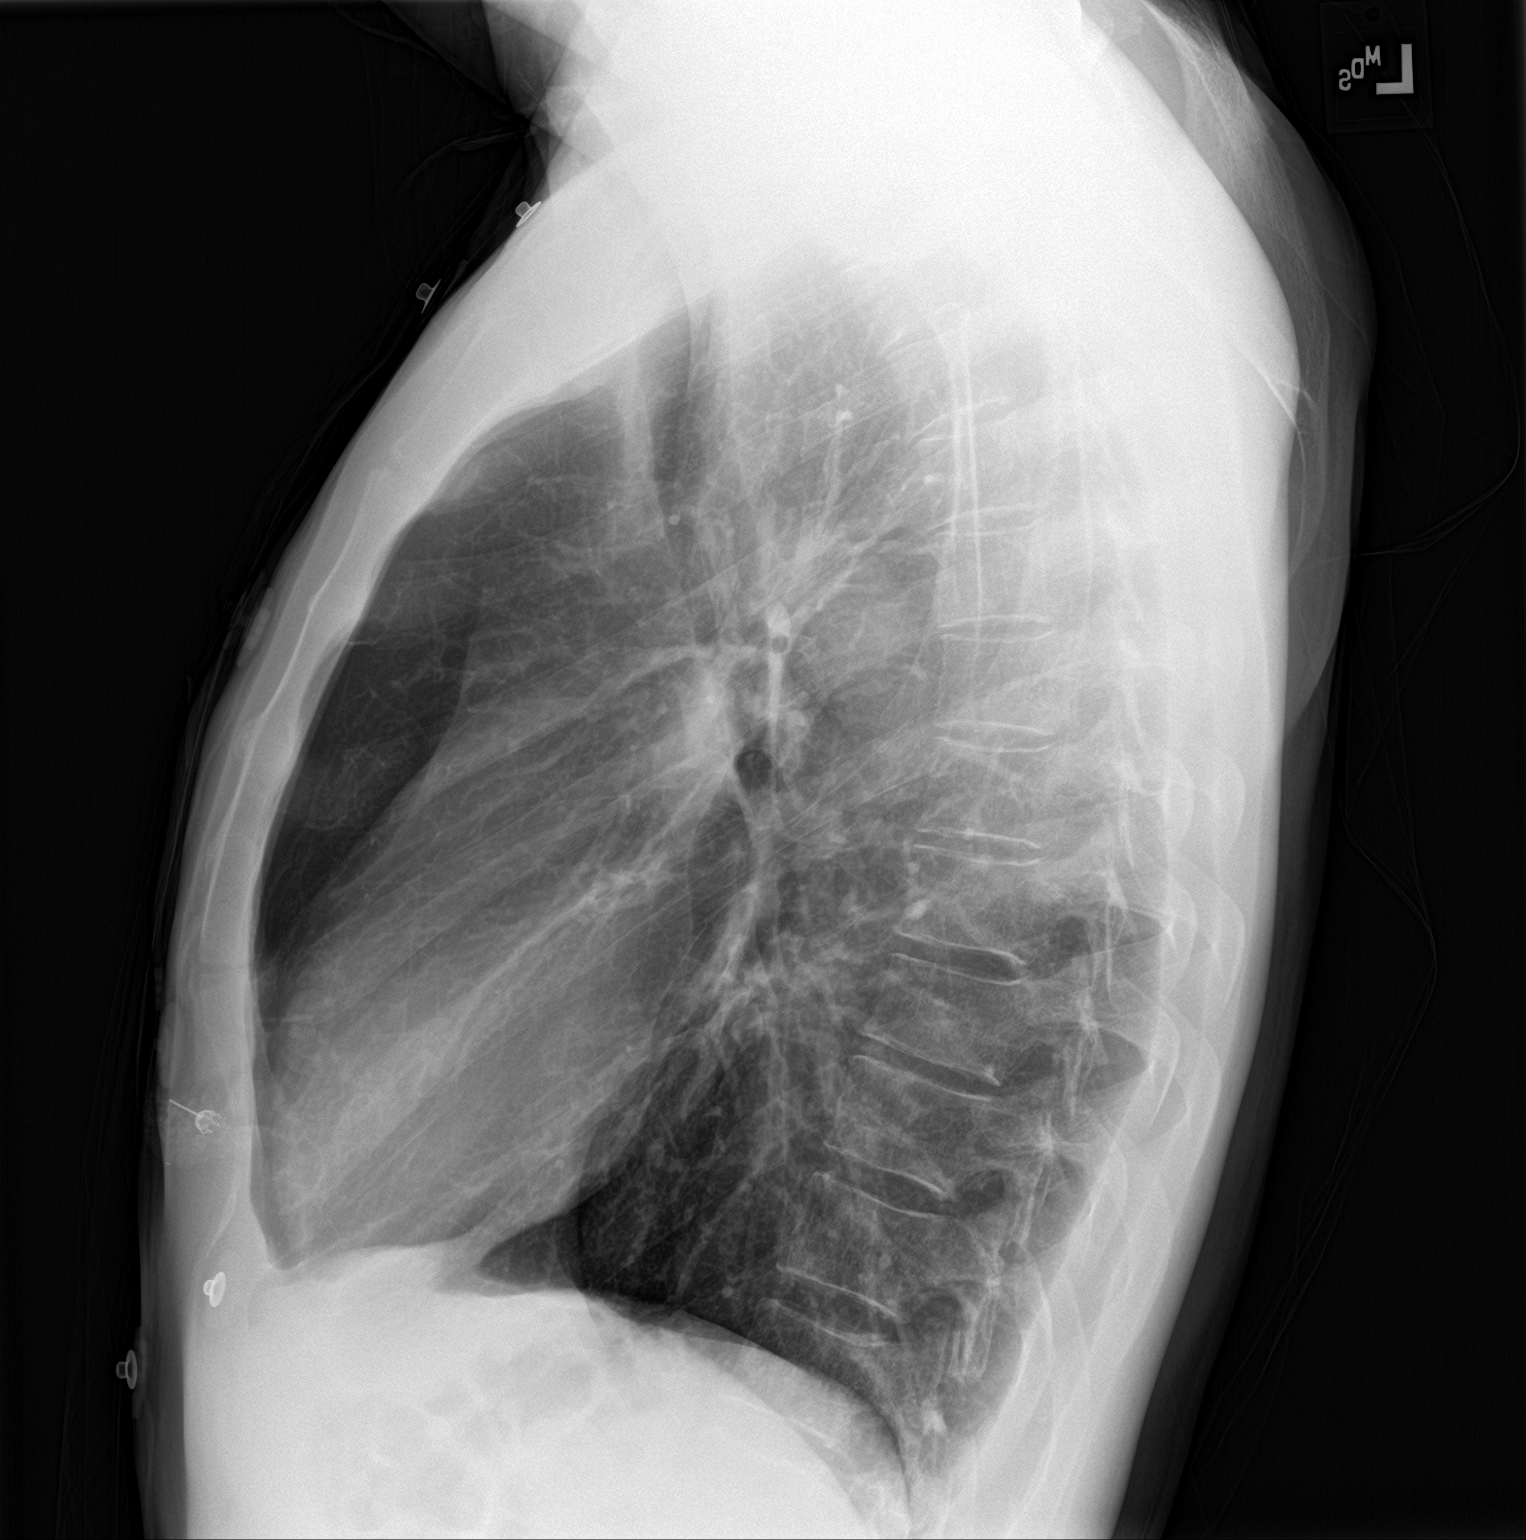

[2 of 2 positions shown; findings below may reference images not displayed]

FINDINGS: The lungs are well-aerated and clear. There is no evidence of focal
opacification, pleural effusion or pneumothorax.

The heart is normal in size; the mediastinal contour is within
normal limits. No acute osseous abnormalities are seen. A metallic
piercing is noted overlying the left nipple shadow.
IMPRESSION: No acute cardiopulmonary process seen.

## 2018-11-21 IMAGING — CT CT HEART MORPH/PULM VEIN W/ CM & W/O CA SCORE
4 of 7 series · 8 of 20 positions shown, 9 images · non-contrast
Comparison: None.

CLINICAL DATA: Chest pain

EXAM:
Cardiac CTA
MEDICATIONS:
Sub lingual nitro. 4mg and lopressor 5mg
TECHNIQUE: The patient was scanned on a Siemens [REDACTED]ice scanner. Gantry
rotation speed was 250 msecs. Collimation was .6 mm. A 100 kV
prospective scan was triggered in the ascending thoracic aorta at
140 HU's at 65-75% of the R-R interval. Average HR during the scan
was 65 bpm. The 3D data set was interpreted on a dedicated work
station using MPR, MIP and VRT modes. A total of 80cc of contrast
was used.

[Series 6: best diast 74 % · axial · 0.34mm/px · z∈[+1293,+1356]mm · 2 of 478 slices shown, 3 images]
[im 160/478  vessel]
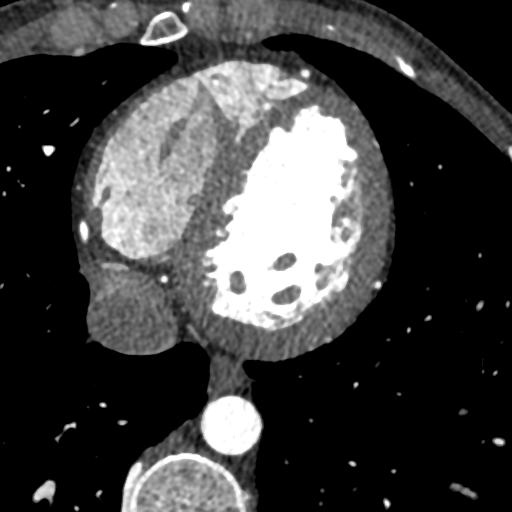
[im 160/478  lung]
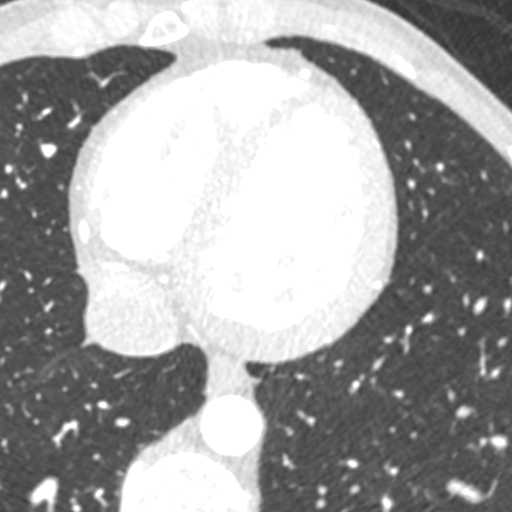
[im 319/478  vessel]
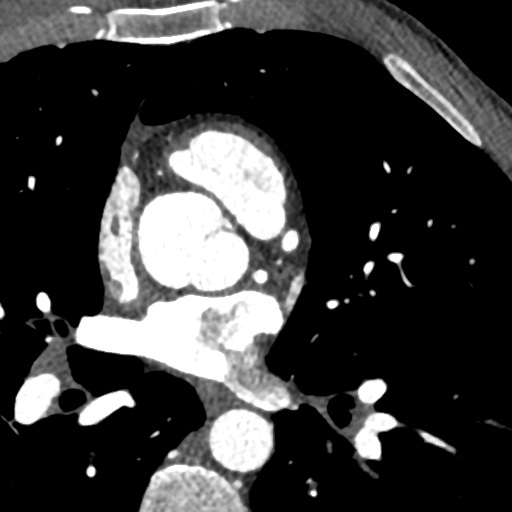

[Series 7: best syst 39 % · axial · 0.34mm/px · z∈[+1293,+1356]mm · 2 of 478 slices shown]
[im 160/478  vessel]
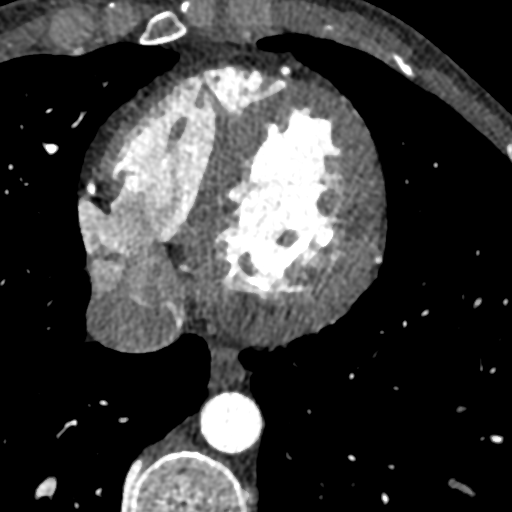
[im 319/478  vessel]
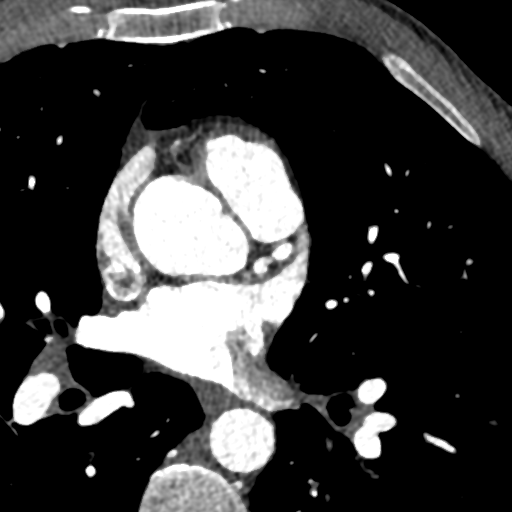

[Series 8: ts diast sharp 74 % · axial · 0.34mm/px · z∈[+1293,+1356]mm · 2 of 478 slices shown]
[im 160/478  lung]
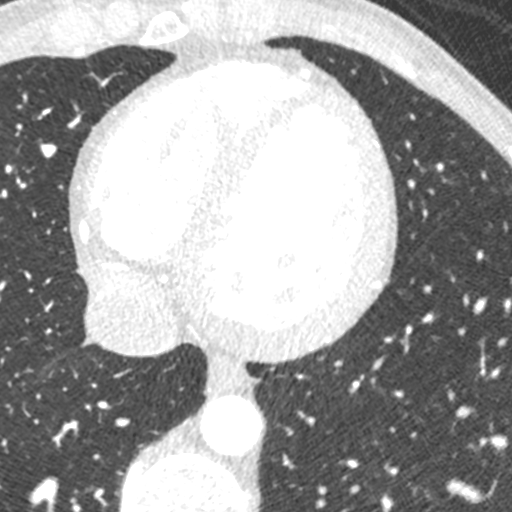
[im 319/478  lung]
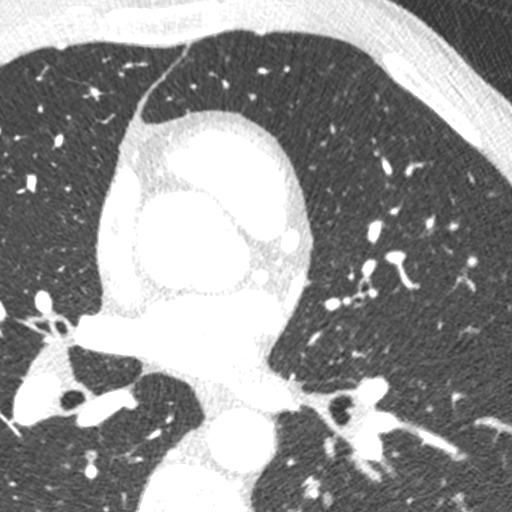

[Series 9: ts syst sharp 39 % · axial · 0.34mm/px · z∈[+1293,+1356]mm · 2 of 478 slices shown]
[im 160/478  lung]
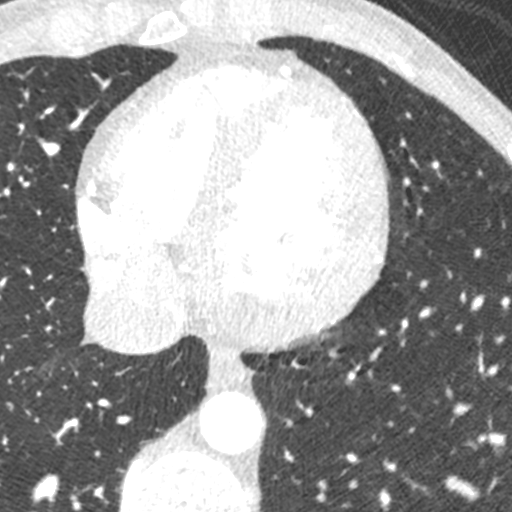
[im 319/478  lung]
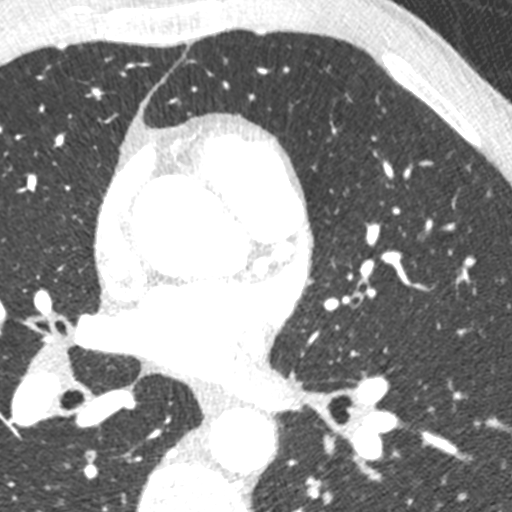

[8 of 20 positions shown; findings below may reference images not displayed]

FINDINGS: Non-cardiac: See separate report from [REDACTED].

Calcium Score:  0 Agatston units.

Coronary Arteries: Right dominant with no anomalies

LM:  No plaque or stenosis.

LAD system:  Large caliber LAD.  No plaque or stenosis.

Circumflex system: Large caliber AV LCx with large OM1. No plaque or
stenosis.

RCA:  No plaque or stenosis.
IMPRESSION: 1. Coronary artery clalcium score 0 Agatston units, suggesting low
risk for future cardiac events.

2. No plaque or stenosis in the coronary arteries. The LAD and LCx
were large caliber vessels.

Sulxayev Gebekbeyova

EXAM:
OVER-READ INTERPRETATION  CT CHEST

The following report is an over-read performed by radiologist Dr.
over-read does not include interpretation of cardiac or coronary
anatomy or pathology. The coronary CTA interpretation by the
cardiologist is attached.
FINDINGS: Limited view of the lung parenchyma demonstrates no suspicious
nodularity. Airways are normal.

Limited view of the mediastinum demonstrates no adenopathy.
Esophagus normal.

Limited view of the upper abdomen unremarkable.

Limited view of the skeleton and chest wall is unremarkable.
IMPRESSION: No significant extracardiac findings.

## 2018-11-21 NOTE — Progress Notes (Signed)
Pa sent to [facility] today for dupixent. Pt completed dupixent enrollment form and the was faxed to [facility].

## 2018-11-23 LAB — MISCELLANEOUS SENDOUT

## 2018-11-24 NOTE — Progress Notes (Signed)
dupixent approved by insurance. Approval notice was routed to [facility] my way. Left a message for pt. He can come in for starting dose.

## 2018-11-28 NOTE — Telephone Encounter (Signed)
Pt came in for the loading doses of dupixent. Literature was reviewed. He read the instructions on how to administer the injection. He was able to self administer and demonstrated the proper technique. He will mark it on his calendar. He is due to start the maintenance dosage in 15 days. He will call [facility] my [facility] if he has not heard from them by Monday.

## 2018-11-28 NOTE — Progress Notes (Signed)
Faxed rx to dupixent my way and notified pt of rx. He was asked to call me back to arrange to come in to obtain his loading does and to demonstrate administration.

## 2018-12-01 NOTE — Telephone Encounter (Signed)
Lab called to see what Ray Rodgers wants done regarding incomplete RAST test. Some items were not checked d/t insufficient sample. I told the lab that the pt will not likely need to come back to be tested for remaining potential allergens. He has already  started on dupixent on Friday 11/28/18.

## 2018-12-17 NOTE — Telephone Encounter (Signed)
Pt was given the phone number to [facility]. He will call to arrange for delivery of dupixent.

## 2019-01-08 NOTE — Telephone Encounter (Signed)
[facility] has tried to call the pt 3 times to s/u delivery of dupixent. Pt has not responded. I left a message for pt yesterday and today. Pt has not returned my calls. The ph # to [facility] is [phone].

## 2019-01-13 NOTE — Telephone Encounter (Signed)
Pt came in today to p/u samples of dupixent. We do not have samples. He was given the phone number to [facility] pharmacy to have his dupixent shipped. They have made multiple attempts to schedule delivery.

## 2019-02-04 MED ORDER — ALBUTEROL SULFATE HFA 108 (90 BASE) MCG/ACT IN AERS
108 (90 Base) MCG/ACT | RESPIRATORY_TRACT | 2 refills | Status: DC | PRN
Start: 2019-02-04 — End: 2019-06-11

## 2019-02-04 MED ORDER — FLUTICASONE-SALMETEROL 250-50 MCG/DOSE IN AEPB
250-50 MCG/DOSE | Freq: Two times a day (BID) | RESPIRATORY_TRACT | 5 refills | Status: DC
Start: 2019-02-04 — End: 2019-06-11

## 2019-02-04 NOTE — Telephone Encounter (Signed)
Pt wants advair and alb hfa rx e-scribed to his [facility]. rx was sent for both.

## 2019-02-24 ENCOUNTER — Encounter: Attending: Acute Care

## 2019-03-04 ENCOUNTER — Encounter: Attending: Acute Care

## 2019-03-24 ENCOUNTER — Encounter: Attending: Acute Care

## 2019-06-11 MED ORDER — ALBUTEROL SULFATE HFA 108 (90 BASE) MCG/ACT IN AERS
108 (90 Base) MCG/ACT | RESPIRATORY_TRACT | 2 refills | Status: DC | PRN
Start: 2019-06-11 — End: 2019-09-16

## 2019-06-11 MED ORDER — FLUTICASONE-SALMETEROL 250-50 MCG/DOSE IN AEPB
250-50 MCG/DOSE | Freq: Two times a day (BID) | RESPIRATORY_TRACT | 5 refills | Status: DC
Start: 2019-06-11 — End: 2019-09-16

## 2019-09-16 ENCOUNTER — Inpatient Hospital Stay
Admit: 2019-09-16 | Discharge: 2019-09-16 | Disposition: A | Discharge status: Home / Self Care | Payer: PRIVATE HEALTH INSURANCE | Attending: Emergency Medicine

## 2019-09-16 DIAGNOSIS — J4521 Mild intermittent asthma with (acute) exacerbation: Secondary | ICD-10-CM

## 2019-09-16 MED ORDER — ALBUTEROL SULFATE HFA 108 (90 BASE) MCG/ACT IN AERS
108 (90 Base) MCG/ACT | RESPIRATORY_TRACT | 2 refills | Status: DC | PRN
Start: 2019-09-16 — End: 2019-10-27

## 2019-09-16 MED ORDER — PREDNISONE 20 MG PO TABS
20 MG | Freq: Once | ORAL | Status: AC
Start: 2019-09-16 — End: 2019-09-16
  Administered 2019-09-16: 22:00:00 60 mg via ORAL

## 2019-09-16 MED ORDER — PREDNISONE 50 MG PO TABS
50 MG | ORAL_TABLET | Freq: Every day | ORAL | 0 refills | Status: AC
Start: 2019-09-16 — End: 2019-09-21

## 2019-09-16 MED ORDER — IPRATROPIUM-ALBUTEROL 0.5-2.5 (3) MG/3ML IN SOLN
Freq: Once | RESPIRATORY_TRACT | Status: AC
Start: 2019-09-16 — End: 2019-09-16
  Administered 2019-09-16: 22:00:00 1 via RESPIRATORY_TRACT

## 2019-09-16 MED ORDER — FLUTICASONE-SALMETEROL 250-50 MCG/DOSE IN AEPB
250-50 MCG/DOSE | Freq: Two times a day (BID) | RESPIRATORY_TRACT | 5 refills | Status: DC
Start: 2019-09-16 — End: 2019-12-02

## 2019-09-16 MED FILL — IPRATROPIUM-ALBUTEROL 0.5-2.5 (3) MG/3ML IN SOLN: RESPIRATORY_TRACT | Qty: 3

## 2019-09-16 MED FILL — PREDNISONE 20 MG PO TABS: 20 MG | ORAL | Qty: 3

## 2019-09-16 NOTE — ED Provider Notes (Signed)
HPI:  09/16/19,   Time: 5:24 PM EDT       Ray Rodgers is a 39 y.o. male presenting to the ED for asthma exacerbation, beginning 6 hr ago.  The complaint has been persistent, moderate in severity, and worsened by deep breath.  Out of advair and mdi for past week. Cough with wheezing.  Better with nebs by ems.  No fever/chills/sweats/n/v/cp/abd pain.  Feels like prvs asthma flare.      Review of Systems:   Pertinent positives and negatives are stated within HPI, all other systems reviewed and are negative.          --------------------------------------------- PAST HISTORY ---------------------------------------------  Past Medical History:  has a past medical history of Asthma.    Past Surgical History:  has no past surgical history on file.    Social History:  reports that he quit smoking about 22 months ago. His smoking use included cigarettes. He has never used smokeless tobacco. He reports that he does not drink alcohol and does not use drugs.    Family History: family history is not on file.     The patient???s home medications have been reviewed.    Allergies: Patient has no known allergies.        ---------------------------------------------------PHYSICAL EXAM--------------------------------------    Constitutional/General: Alert and oriented x3, well appearing, non toxic in NAD  Head: Normocephalic and atraumatic  Eyes: PERRL, EOMI, conjunctive normal, sclera non icteric  Mouth: Oropharynx clear, handling secretions,   Neck: Supple, full ROM,   Respiratory: mild wheezing Not in respiratory distress  Cardiovascular:  Regular rate. Regular rhythm. No murmurs, gallops, or rubs. 2+ distal pulses  Chest: No chest wall tenderness  GI:  Abdomen Soft, Non tender, Non distended. .   Musculoskeletal: Moves all extremities x 4. Warm and well perfused, no clubbing, cyanosis, or edema. Capillary refill <3 seconds  Integument: skin warm and dry. No rashes.   Lymphatic: no lymphadenopathy noted  Neurologic: GCS 15, no  focal deficits,   Psychiatric: Normal Affect    -------------------------------------------------- RESULTS -------------------------------------------------  I have personally reviewed all laboratory and imaging results for this patient. Results are listed below.     LABS:  No results found for this visit on 09/16/19.    RADIOLOGY:  Interpreted by Radiologist.  No orders to display       EKG:  This EKG is signed and interpreted by the EP.    Time:   Rate:   Rhythm:   Interpretation:   Comparison:       ------------------------- NURSING NOTES AND VITALS REVIEWED ---------------------------   The nursing notes within the ED encounter and vital signs as below have been reviewed by myself.  BP 122/80    Pulse 88    Temp 97.8 ??F (36.6 ??C)    Resp 18    Ht 6' (1.829 m)    Wt 188 lb (85.3 kg)    SpO2 100%    BMI 25.50 kg/m??   Oxygen Saturation Interpretation: Normal    The patient???s available past medical records and past encounters were reviewed.        ------------------------------ ED COURSE/MEDICAL DECISION MAKING----------------------  Medications   predniSONE (DELTASONE) tablet 60 mg (60 mg Oral Given 09/16/19 1808)   ipratropium-albuterol (DUONEB) nebulizer solution 1 ampule (1 ampule Inhalation Given 09/16/19 1808)         ED COURSE:       Medical Decision Making:    Non toxic, clinically athma flare, better with nebs,  refill rx, add steroids, and outpt fu      This patient's ED course included: a personal history and physicial examination    This patient has remained hemodynamically stable during their ED course.      Re-Evaluations:             Re-evaluation.  Patient???s symptoms are improving          Counseling:   The emergency provider has spoken with the patient and discussed today???s results, in addition to providing specific details for the plan of care and counseling regarding the diagnosis and prognosis.  Questions are answered at this time and they are agreeable with the plan.        --------------------------------- IMPRESSION AND DISPOSITION ---------------------------------    IMPRESSION  1. Mild intermittent asthma with exacerbation        DISPOSITION  Disposition: Discharge to home  Patient condition is stable    NOTE: This report was transcribed using voice recognition software. Every effort was made to ensure accuracy; however, inadvertent computerized transcription errors may be present        Eligha Bridegroom, MD  09/16/19 [phone]

## 2019-09-16 NOTE — ED Notes (Signed)
Bed: 34  Expected date:   Expected time:   Means of arrival:   Comments:  Luz Brazen, RN  09/16/19 1701

## 2019-09-22 NOTE — Care Coordination-Inpatient (Signed)
ACM attempted to contact patient via telephone and reached recording stating that person called in unavailable. Patient does not have a PCP on file.  Will place on temporary exclusion list.

## 2019-10-27 ENCOUNTER — Emergency Department: Admit: 2019-10-27 | Payer: PRIVATE HEALTH INSURANCE

## 2019-10-27 ENCOUNTER — Inpatient Hospital Stay
Admit: 2019-10-27 | Discharge: 2019-10-27 | Disposition: A | Discharge status: Home / Self Care | Payer: PRIVATE HEALTH INSURANCE | Attending: Emergency Medicine

## 2019-10-27 DIAGNOSIS — J45901 Unspecified asthma with (acute) exacerbation: Secondary | ICD-10-CM

## 2019-10-27 LAB — CBC WITH AUTO DIFFERENTIAL
Basophils %: 1.7 % (ref 0.0–2.0)
Basophils Absolute: 0.1 E9/L (ref 0.00–0.20)
Eosinophils %: 6.7 % — ABNORMAL HIGH (ref 0.0–6.0)
Eosinophils Absolute: 0.4 E9/L (ref 0.05–0.50)
Hematocrit: 46 % (ref 37.0–54.0)
Hemoglobin: 15.4 g/dL (ref 12.5–16.5)
Immature Granulocytes #: 0.02 E9/L
Immature Granulocytes %: 0.3 % (ref 0.0–5.0)
Lymphocytes %: 34.1 % (ref 20.0–42.0)
Lymphocytes Absolute: 2.05 E9/L (ref 1.50–4.00)
MCH: 28.5 pg (ref 26.0–35.0)
MCHC: 33.5 % (ref 32.0–34.5)
MCV: 85 fL (ref 80.0–99.9)
MPV: 10.1 fL (ref 7.0–12.0)
Monocytes %: 9.8 % (ref 2.0–12.0)
Monocytes Absolute: 0.59 E9/L (ref 0.10–0.95)
Neutrophils %: 47.4 % (ref 43.0–80.0)
Neutrophils Absolute: 2.85 E9/L (ref 1.80–7.30)
Platelets: 247 E9/L (ref 130–450)
RBC: 5.41 E12/L (ref 3.80–5.80)
RDW: 14.5 fL (ref 11.5–15.0)
WBC: 6 E9/L (ref 4.5–11.5)

## 2019-10-27 LAB — COMPREHENSIVE METABOLIC PANEL
ALT: 24 U/L (ref 0–40)
AST: 32 U/L (ref 0–39)
Albumin: 4.4 g/dL (ref 3.5–5.2)
Alkaline Phosphatase: 108 U/L (ref 40–129)
Anion Gap: 10 mmol/L (ref 7–16)
BUN: 23 mg/dL — ABNORMAL HIGH (ref 6–20)
CO2: 26 mmol/L (ref 22–29)
Calcium: 9.4 mg/dL (ref 8.6–10.2)
Chloride: 103 mmol/L (ref 98–107)
Creatinine: 1.2 mg/dL (ref 0.7–1.2)
GFR African American: >60
GFR Non-African American: >60 mL/min/{1.73_m2} (ref >=60)
Glucose: 138 mg/dL — ABNORMAL HIGH (ref 74–99)
Potassium: 3.8 mmol/L (ref 3.5–5.0)
Sodium: 139 mmol/L (ref 132–146)
Total Bilirubin: 0.3 mg/dL (ref 0.0–1.2)
Total Protein: 7.4 g/dL (ref 6.4–8.3)

## 2019-10-27 LAB — EKG 12-LEAD
Atrial Rate: 84 {beats}/min
P Axis: 79 degrees
P-R Interval: 140 ms
Q-T Interval: 370 ms
QRS Duration: 92 ms
QTc Calculation (Bazett): 437 ms
R Axis: 81 degrees
T Axis: 70 degrees
Ventricular Rate: 84 {beats}/min

## 2019-10-27 LAB — TROPONIN: Troponin, High Sensitivity: <6 ng/L (ref 0–11)

## 2019-10-27 MED ORDER — SODIUM CHLORIDE 0.9 % IV BOLUS
0.9 % | Freq: Once | INTRAVENOUS | Status: AC
Start: 2019-10-27 — End: 2019-10-27
  Administered 2019-10-27: 11:00:00 1000 mL via INTRAVENOUS

## 2019-10-27 MED ORDER — IPRATROPIUM-ALBUTEROL 0.5-2.5 (3) MG/3ML IN SOLN
Freq: Once | RESPIRATORY_TRACT | Status: AC
Start: 2019-10-27 — End: 2019-10-27
  Administered 2019-10-27: 06:00:00 3 via RESPIRATORY_TRACT

## 2019-10-27 MED ORDER — ALBUTEROL SULFATE HFA 108 (90 BASE) MCG/ACT IN AERS
108 (90 Base) MCG/ACT | RESPIRATORY_TRACT | 2 refills | Status: DC | PRN
Start: 2019-10-27 — End: 2019-12-07

## 2019-10-27 MED ORDER — DIPHENHYDRAMINE HCL 50 MG/ML IJ SOLN
50 MG/ML | Freq: Once | INTRAMUSCULAR | Status: AC
Start: 2019-10-27 — End: 2019-10-27
  Administered 2019-10-27: 07:00:00 25 mg via INTRAVENOUS

## 2019-10-27 MED ORDER — METHYLPREDNISOLONE 4 MG PO TBPK
4 MG | ORAL_TABLET | ORAL | 0 refills | Status: AC
Start: 2019-10-27 — End: 2019-11-02

## 2019-10-27 MED ORDER — METHYLPREDNISOLONE SODIUM SUCC 125 MG IJ SOLR
125 MG | Freq: Once | INTRAMUSCULAR | Status: DC
Start: 2019-10-27 — End: 2019-10-27

## 2019-10-27 MED ORDER — METOCLOPRAMIDE HCL 5 MG/ML IJ SOLN
5 MG/ML | Freq: Once | INTRAMUSCULAR | Status: AC
Start: 2019-10-27 — End: 2019-10-27
  Administered 2019-10-27: 07:00:00 10 mg via INTRAVENOUS

## 2019-10-27 MED FILL — METOCLOPRAMIDE HCL 5 MG/ML IJ SOLN: 5 mg/mL | INTRAMUSCULAR | Qty: 2

## 2019-10-27 MED FILL — IPRATROPIUM-ALBUTEROL 0.5-2.5 (3) MG/3ML IN SOLN: 0.5-2.5 (3) MG/3ML | RESPIRATORY_TRACT | Qty: 9

## 2019-10-27 MED FILL — DIPHENHYDRAMINE HCL 50 MG/ML IJ SOLN: 50 mg/mL | INTRAMUSCULAR | Qty: 1

## 2019-10-27 NOTE — ED Notes (Signed)
Patient to xray at this time       Nicholaus Bloom, LPN  56/97/94 [phone]

## 2019-10-27 NOTE — ED Notes (Signed)
Report given to [facility], Charity fundraiser.       Jodi Marble, RN  10/27/19 [phone]

## 2019-10-27 NOTE — ED Notes (Signed)
 Dr. Coralyn Mark notified of BP 86/66, verbal order received.      Jodi Marble, RN  10/27/19 [phone]

## 2019-10-27 NOTE — ED Provider Notes (Addendum)
HPI:  10/27/19, Time: 1:22 AM EDT         Ray Rodgers is a 39 y.o. male presenting to the ED for shortness of breath and asthma attack, beginning hours ago.  The complaint has been persistent, moderate in severity, and worsened by nothing.  Patient reports history of asthma and symptoms of asthma attack.  Patient reporting some cough.  Patient reporting headache as well patient reporting no nausea or vomiting he does report pain in the chest with coughing.  Patient reporting no abdominal pain he reports no leg pain or swelling there is no fever.  Patient reporting no calf pain or swelling.    ROS:   Pertinent positives and negatives are stated within HPI, all other systems reviewed and are negative.  --------------------------------------------- PAST HISTORY ---------------------------------------------  Past Medical History:  has a past medical history of Asthma.    Past Surgical History:  has no past surgical history on file.    Social History:  reports that he quit smoking about 2 years ago. His smoking use included cigarettes. He has never used smokeless tobacco. He reports that he does not drink alcohol and does not use drugs.    Family History: family history is not on file.     The patient???s home medications have been reviewed.    Allergies: Patient has no known allergies.    ---------------------------------------------------PHYSICAL EXAM--------------------------------------    Constitutional/General: Alert and oriented x3, mild distress  Head: Normocephalic and atraumatic  Eyes: PERRL, EOMI  Mouth: Oropharynx clear, handling secretions, no trismus  Neck: Supple, full ROM, non tender to palpation in the midline, no stridor, no crepitus, no meningeal signs  Pulmonary: Lungs diminished bilaterally  Cardiovascular:  Regular rate. Regular rhythm. No murmurs, gallops, or rubs. 2+ distal pulses  Chest: no chest wall tenderness  Abdomen: Soft.  Non tender. Non distended.  +BS.  No rebound, guarding, or  rigidity. No pulsatile masses appreciated.  Musculoskeletal: Moves all extremities x 4. Warm and well perfused, no clubbing, cyanosis, or edema. Capillary refill <3 seconds  Skin: warm and dry. No rashes.   Neurologic: GCS 15, CN 2-12 grossly intact, no focal deficits, symmetric strength 5/5 in the upper and lower extremities bilaterally  Psych: Normal Affect    -------------------------------------------------- RESULTS -------------------------------------------------  I have personally reviewed all laboratory and imaging results for this patient. Results are listed below.     LABS:  Results for orders placed or performed during the hospital encounter of 10/27/19   Troponin   Result Value Ref Range    Troponin, High Sensitivity <6 0 - 11 ng/L   CBC Auto Differential   Result Value Ref Range    WBC 6.0 4.5 - 11.5 E9/L    RBC 5.41 3.80 - 5.80 E12/L    Hemoglobin 15.4 12.5 - 16.5 g/dL    Hematocrit 32.0 23.3 - 54.0 %    MCV 85.0 80.0 - 99.9 fL    MCH 28.5 26.0 - 35.0 pg    MCHC 33.5 32.0 - 34.5 %    RDW 14.5 11.5 - 15.0 fL    Platelets 247 130 - 450 E9/L    MPV 10.1 7.0 - 12.0 fL    Neutrophils % 47.4 43.0 - 80.0 %    Immature Granulocytes % 0.3 0.0 - 5.0 %    Lymphocytes % 34.1 20.0 - 42.0 %    Monocytes % 9.8 2.0 - 12.0 %    Eosinophils % 6.7 (H) 0.0 - 6.0 %  Basophils % 1.7 0.0 - 2.0 %    Neutrophils Absolute 2.85 1.80 - 7.30 E9/L    Immature Granulocytes # 0.02 E9/L    Lymphocytes Absolute 2.05 1.50 - 4.00 E9/L    Monocytes Absolute 0.59 0.10 - 0.95 E9/L    Eosinophils Absolute 0.40 0.05 - 0.50 E9/L    Basophils Absolute 0.10 0.00 - 0.20 E9/L   Comprehensive Metabolic Panel   Result Value Ref Range    Sodium 139 132 - 146 mmol/L    Potassium 3.8 3.5 - 5.0 mmol/L    Chloride 103 98 - 107 mmol/L    CO2 26 22 - 29 mmol/L    Anion Gap 10 7 - 16 mmol/L    Glucose 138 (H) 74 - 99 mg/dL    BUN 23 (H) 6 - 20 mg/dL    CREATININE 1.2 0.7 - 1.2 mg/dL    GFR Non-African American >60 >=60 mL/min/1.73    GFR African American  >60     Calcium 9.4 8.6 - 10.2 mg/dL    Total Protein 7.4 6.4 - 8.3 g/dL    Albumin 4.4 3.5 - 5.2 g/dL    Total Bilirubin 0.3 0.0 - 1.2 mg/dL    Alkaline Phosphatase 108 40 - 129 U/L    ALT 24 0 - 40 U/L    AST 32 0 - 39 U/L   EKG 12 Lead   Result Value Ref Range    Ventricular Rate 84 BPM    Atrial Rate 84 BPM    P-R Interval 140 ms    QRS Duration 92 ms    Q-T Interval 370 ms    QTc Calculation (Bazett) 437 ms    P Axis 79 degrees    R Axis 81 degrees    T Axis 70 degrees       RADIOLOGY:  Interpreted by Radiologist.  XR CHEST (2 VW)   Final Result      There is no acute abnormality seen.      Hyperinflation may reflect COPD.         CT HEAD WO CONTRAST    (Results Pending)       EKG:  This EKG is signed and interpreted by me.    Rate: 84  Rhythm: Sinus  Interpretation: no acute changes  Comparison: stable as compared to patient's most recent EKG        ------------------------- NURSING NOTES AND VITALS REVIEWED ---------------------------   The nursing notes within the ED encounter and vital signs as below have been reviewed by myself.  BP 120/67    Pulse 79    Temp 97.3 ??F (36.3 ??C) (Infrared)    Resp 21    Ht 6' (1.829 m)    Wt 188 lb (85.3 kg)    SpO2 99%    BMI 25.50 kg/m??   Oxygen Saturation Interpretation: Normal    The patient???s available past medical records and past encounters were reviewed.        ------------------------------ ED COURSE/MEDICAL DECISION MAKING----------------------  Medications   methylPREDNISolone sodium (SOLU-MEDROL) injection 125 mg (125 mg Intravenous Not Given 10/27/19 0130)   ipratropium-albuterol (DUONEB) nebulizer solution 3 ampule (3 ampules Inhalation Given 10/27/19 0150)   metoclopramide (REGLAN) injection 10 mg (10 mg Intravenous Given 10/27/19 0244)   diphenhydrAMINE (BENADRYL) injection 25 mg (25 mg Intravenous Given 10/27/19 0248)             Medical Decision Making:   Patient presenting here because of asthma symptoms  for last several hours.  Patient reporting some cough he  reports pain in his chest with coughing.  Reports no fever no chills he does report headache mainly frontal.  Patient reporting no vomiting or photophobia.  Patient reporting headache slightly worse compared to prior headaches.  Patient reporting no neck pain.  Patient medicated here with improvement of symptoms.  Patient on recheck was resting comfortably.  Patient in no distress..  Patient reporting improvement of symptoms lungs are clear and in no distress.  Patient will be discharged home     Re-Evaluations:             Re-evaluation.  Patient???s symptoms show no change  Patient reevaluated and significantly improved after being medicated.  Patient headache is resolving after Reglan and Benadryl.  Patient made aware lab findings and results.  Patient made aware of plan for follow-up and need to return if symptoms worsen or persist.    Consultations:                 Critical Care:         This patient's ED course included: a personal history and physicial eaxmination    This patient has been closely monitored during their ED course.    Counseling:   The emergency provider has spoken with the patient and discussed today???s results, in addition to providing specific details for the plan of care and counseling regarding the diagnosis and prognosis.  Questions are answered at this time and they are agreeable with the plan.       --------------------------------- IMPRESSION AND DISPOSITION ---------------------------------    IMPRESSION  1. Moderate asthma with exacerbation, unspecified whether persistent    2. Headache, unspecified headache type        DISPOSITION  Disposition: Discharge home  Patient condition is stable        NOTE: This report was transcribed using voice recognition software. Every effort was made to ensure accuracy; however, inadvertent computerized transcription errors may be present          Dwain Sarna, MD  10/27/19 9675       Dwain Sarna, MD  10/27/19 0532

## 2019-11-06 ENCOUNTER — Inpatient Hospital Stay
Admit: 2019-11-06 | Discharge: 2019-11-06 | Disposition: A | Discharge status: Home / Self Care | Payer: PRIVATE HEALTH INSURANCE | Attending: Emergency Medicine

## 2019-11-06 ENCOUNTER — Emergency Department: Admit: 2019-11-06 | Payer: PRIVATE HEALTH INSURANCE

## 2019-11-06 DIAGNOSIS — J45901 Unspecified asthma with (acute) exacerbation: Secondary | ICD-10-CM

## 2019-11-06 LAB — CBC WITH AUTO DIFFERENTIAL
Basophils %: 1.1 % (ref 0.0–2.0)
Basophils Absolute: 0.1 E9/L (ref 0.00–0.20)
Eosinophils %: 6.7 % — ABNORMAL HIGH (ref 0.0–6.0)
Eosinophils Absolute: 0.62 E9/L — ABNORMAL HIGH (ref 0.05–0.50)
Hematocrit: 46.8 % (ref 37.0–54.0)
Hemoglobin: 15.5 g/dL (ref 12.5–16.5)
Immature Granulocytes #: 0.04 E9/L
Immature Granulocytes %: 0.4 % (ref 0.0–5.0)
Lymphocytes %: 11.5 % — ABNORMAL LOW (ref 20.0–42.0)
Lymphocytes Absolute: 1.06 E9/L — ABNORMAL LOW (ref 1.50–4.00)
MCH: 27.9 pg (ref 26.0–35.0)
MCHC: 33.1 % (ref 32.0–34.5)
MCV: 84.2 fL (ref 80.0–99.9)
MPV: 10.1 fL (ref 7.0–12.0)
Monocytes %: 4.6 % (ref 2.0–12.0)
Monocytes Absolute: 0.43 E9/L (ref 0.10–0.95)
Neutrophils %: 75.7 % (ref 43.0–80.0)
Neutrophils Absolute: 7 E9/L (ref 1.80–7.30)
Platelets: 205 E9/L (ref 130–450)
RBC: 5.56 E12/L (ref 3.80–5.80)
RDW: 14.2 fL (ref 11.5–15.0)
WBC: 9.3 E9/L (ref 4.5–11.5)

## 2019-11-06 LAB — COMPREHENSIVE METABOLIC PANEL W/ REFLEX TO MG FOR LOW K
ALT: 18 U/L (ref 0–40)
AST: 22 U/L (ref 0–39)
Albumin: 4 g/dL (ref 3.5–5.2)
Alkaline Phosphatase: 101 U/L (ref 40–129)
Anion Gap: 12 mmol/L (ref 7–16)
BUN: 13 mg/dL (ref 6–20)
CO2: 22 mmol/L (ref 22–29)
Calcium: 8.5 mg/dL — ABNORMAL LOW (ref 8.6–10.2)
Chloride: 109 mmol/L — ABNORMAL HIGH (ref 98–107)
Creatinine: 0.9 mg/dL (ref 0.7–1.2)
GFR African American: >60
GFR Non-African American: >60 mL/min/{1.73_m2} (ref >=60)
Glucose: 93 mg/dL (ref 74–99)
Potassium reflex Magnesium: 3.8 mmol/L (ref 3.5–5.0)
Sodium: 143 mmol/L (ref 132–146)
Total Bilirubin: 0.3 mg/dL (ref 0.0–1.2)
Total Protein: 6.7 g/dL (ref 6.4–8.3)

## 2019-11-06 LAB — TROPONIN: Troponin, High Sensitivity: <6 ng/L (ref 0–11)

## 2019-11-06 LAB — LACTIC ACID: Lactic Acid: 0.7 mmol/L (ref 0.5–2.2)

## 2019-11-06 LAB — COVID-19, RAPID: SARS-CoV-2, NAAT: NOT DETECTED

## 2019-11-06 MED ORDER — PREDNISONE 20 MG PO TABS
20 MG | ORAL_TABLET | Freq: Every day | ORAL | 0 refills | Status: AC
Start: 2019-11-06 — End: 2019-11-11

## 2019-11-06 MED ORDER — MAGNESIUM SULFATE 2000 MG/50 ML IVPB PREMIX
2 GM/50ML | Freq: Once | INTRAVENOUS | Status: AC
Start: 2019-11-06 — End: 2019-11-06
  Administered 2019-11-06: 19:00:00 2000 mg via INTRAVENOUS

## 2019-11-06 MED ORDER — ALBUTEROL SULFATE (2.5 MG/3ML) 0.083% IN NEBU
Freq: Once | RESPIRATORY_TRACT | Status: AC
Start: 2019-11-06 — End: 2019-11-06
  Administered 2019-11-06: 20:00:00 2.5 mg via RESPIRATORY_TRACT

## 2019-11-06 MED ORDER — ALBUTEROL SULFATE HFA 108 (90 BASE) MCG/ACT IN AERS
108 (90 Base) MCG/ACT | Freq: Four times a day (QID) | RESPIRATORY_TRACT | 1 refills | Status: DC | PRN
Start: 2019-11-06 — End: 2019-12-02

## 2019-11-06 MED ORDER — IPRATROPIUM-ALBUTEROL 0.5-2.5 (3) MG/3ML IN SOLN
Freq: Once | RESPIRATORY_TRACT | Status: AC
Start: 2019-11-06 — End: 2019-11-06
  Administered 2019-11-06: 18:00:00 3 via RESPIRATORY_TRACT

## 2019-11-06 MED FILL — MAGNESIUM SULFATE 2 GM/50ML IV SOLN: 2 GM/50ML | INTRAVENOUS | Qty: 50

## 2019-11-06 MED FILL — IPRATROPIUM-ALBUTEROL 0.5-2.5 (3) MG/3ML IN SOLN: 0.5-2.5 (3) MG/3ML | RESPIRATORY_TRACT | Qty: 9

## 2019-11-06 NOTE — ED Provider Notes (Signed)
HPI  Ray Rodgers is a 39 y.o. male with a PMHx significant for asthma who presents with an asthma exacerbation and URI symptoms. The patient's complaints are persistent, moderate in severity and worsened by nothing. He states he has been out of his medications "for a while" as he does not currently have a PCP and he has not seen his pulmonologist in a long time. He is unable to elaborate on either of those things. He endorses congestion, intermittent cough productive of clear sputum and a headache. He was given solumedrol, duobebs and magnesium in route. The patient denies recent trauma, fever, chills, fatigue, HA, dizziness, vision changes, eye pain/discharge, rhinorrhea, sore throat, neck pain, chest pain, palpitations, hx of MI, hx of blood clots, LE edema, SOB, hemoptysis, abdominal pain, N/V/D/C, hematochezia, melena, dysuria, hematuria, generalized weakness and paresthesias.     The patient is currently taking no blood thinners.    Tobacco Hx:   reports that he quit smoking about 2 years ago. His smoking use included cigarettes. He has never used smokeless tobacco.    Alcohol Hx:   reports no history of alcohol use.    Illicit Drug Hx:  No reported hx of illicit drug use.    The history is provided by the patient.    Last Tetanus (if applicable): n/a    Review of Systems   Constitutional: Negative for chills, diaphoresis, fatigue and fever.   HENT: Positive for congestion. Negative for rhinorrhea, sinus pressure, sneezing, sore throat and trouble swallowing.    Eyes: Negative for pain, discharge and redness.   Respiratory: Positive for cough and wheezing. Negative for shortness of breath.    Cardiovascular: Negative for chest pain, palpitations and leg swelling.   Gastrointestinal: Negative for abdominal distention, abdominal pain, blood in stool, constipation, diarrhea, nausea and vomiting.   Genitourinary: Negative for difficulty urinating, dysuria, flank pain, frequency and hematuria.    Musculoskeletal: Negative for arthralgias, back pain, myalgias and neck pain.   Skin: Negative for rash and wound.   Neurological: Positive for headaches. Negative for dizziness, syncope, speech difficulty, weakness, light-headedness and numbness.        Physical Exam  Vitals and nursing note reviewed.   Constitutional:       General: He is awake. He is not in acute distress.     Appearance: He is not diaphoretic.   HENT:      Head: Normocephalic and atraumatic.      Comments: No sinus tenderness.      Right Ear: External ear normal.      Left Ear: External ear normal.      Nose: Congestion present. No rhinorrhea.      Mouth/Throat:      Mouth: Mucous membranes are moist.      Pharynx: Oropharynx is clear.   Eyes:      General: No scleral icterus.        Right eye: No discharge.         Left eye: No discharge.      Extraocular Movements: Extraocular movements intact.      Conjunctiva/sclera: Conjunctivae normal.      Pupils: Pupils are equal, round, and reactive to light.   Cardiovascular:      Rate and Rhythm: Normal rate and regular rhythm.      Heart sounds: Normal heart sounds. No murmur heard.   No friction rub. No gallop.       Comments: Upper extremity and lower extremity distal pulses intact bilaterally +  2/4  Pulmonary:      Effort: Pulmonary effort is normal. No respiratory distress.      Breath sounds: Wheezing present. No rhonchi or rales.      Comments: Decreased air movement noted throughout with bilateral expiratory wheezes.  Chest:      Chest wall: No tenderness.   Abdominal:      General: There is no distension.      Palpations: Abdomen is soft.      Tenderness: There is no abdominal tenderness. There is no guarding or rebound.   Musculoskeletal:         General: No tenderness or deformity. Normal range of motion.      Cervical back: Normal range of motion and neck supple. No rigidity. No muscular tenderness.      Right lower leg: No edema.      Left lower leg: No edema.   Lymphadenopathy:       Cervical: No cervical adenopathy.   Skin:     General: Skin is warm and dry.      Capillary Refill: Capillary refill takes less than 2 seconds.      Findings: No erythema or rash.   Neurological:      General: No focal deficit present.      Mental Status: He is alert and oriented to person, place, and time.      Sensory: No sensory deficit.      Motor: No weakness.      Coordination: Coordination normal.          ---------------------------------- PAST HISTORY ---------------------------------------------  Past Medical History:  has a past medical history of Asthma.    Past Surgical History:  has no past surgical history on file.    Social History:  reports that he quit smoking about 2 years ago. His smoking use included cigarettes. He has never used smokeless tobacco. He reports that he does not drink alcohol and does not use drugs.    Family History: family history is not on file.     Home Meds: Not in a hospital admission.  The patient???s home medications have been reviewed.    Allergies: Patient has no known allergies.    ------------------------- NURSING NOTES AND VITALS REVIEWED ---------------------------  Date / Time Roomed:  11/06/2019 11:56 AM  ED Bed Assignment:  14B/14B-14    The nursing notes within the ED encounter and vital signs as below have been reviewed.   BP 126/78    Pulse 95    Temp 97.7 ??F (36.5 ??C) (Temporal)    Resp 18    Wt 187 lb (84.8 kg)    SpO2 97%    BMI 25.36 kg/m??   -------------------------------------------------- RESULTS / INTERVENTIONS -------------------------------------------------  All laboratory and radiology tests have been reviewed by this physician.    LABS:  Results for orders placed or performed during the hospital encounter of 11/06/19   COVID-19, Rapid    Specimen: Nasopharyngeal Swab   Result Value Ref Range    SARS-CoV-2, NAAT Not Detected Not Detected   CBC Auto Differential   Result Value Ref Range    WBC 9.3 4.5 - 11.5 E9/L    RBC 5.56 3.80 - 5.80 E12/L     Hemoglobin 15.5 12.5 - 16.5 g/dL    Hematocrit 18.5 63.1 - 54.0 %    MCV 84.2 80.0 - 99.9 fL    MCH 27.9 26.0 - 35.0 pg    MCHC 33.1 32.0 - 34.5 %    RDW  14.2 11.5 - 15.0 fL    Platelets 205 130 - 450 E9/L    MPV 10.1 7.0 - 12.0 fL    Neutrophils % 75.7 43.0 - 80.0 %    Immature Granulocytes % 0.4 0.0 - 5.0 %    Lymphocytes % 11.5 (L) 20.0 - 42.0 %    Monocytes % 4.6 2.0 - 12.0 %    Eosinophils % 6.7 (H) 0.0 - 6.0 %    Basophils % 1.1 0.0 - 2.0 %    Neutrophils Absolute 7.00 1.80 - 7.30 E9/L    Immature Granulocytes # 0.04 E9/L    Lymphocytes Absolute 1.06 (L) 1.50 - 4.00 E9/L    Monocytes Absolute 0.43 0.10 - 0.95 E9/L    Eosinophils Absolute 0.62 (H) 0.05 - 0.50 E9/L    Basophils Absolute 0.10 0.00 - 0.20 E9/L   Comprehensive Metabolic Panel w/ Reflex to MG   Result Value Ref Range    Sodium 143 132 - 146 mmol/L    Potassium reflex Magnesium 3.8 3.5 - 5.0 mmol/L    Chloride 109 (H) 98 - 107 mmol/L    CO2 22 22 - 29 mmol/L    Anion Gap 12 7 - 16 mmol/L    Glucose 93 74 - 99 mg/dL    BUN 13 6 - 20 mg/dL    CREATININE 0.9 0.7 - 1.2 mg/dL    GFR Non-African American >60 >=60 mL/min/1.73    GFR African American >60     Calcium 8.5 (L) 8.6 - 10.2 mg/dL    Total Protein 6.7 6.4 - 8.3 g/dL    Albumin 4.0 3.5 - 5.2 g/dL    Total Bilirubin 0.3 0.0 - 1.2 mg/dL    Alkaline Phosphatase 101 40 - 129 U/L    ALT 18 0 - 40 U/L    AST 22 0 - 39 U/L   Lactic Acid, Plasma   Result Value Ref Range    Lactic Acid 0.7 0.5 - 2.2 mmol/L   Troponin   Result Value Ref Range    Troponin, High Sensitivity <6 0 - 11 ng/L   EKG 12 Lead   Result Value Ref Range    Ventricular Rate 92 BPM    Atrial Rate 92 BPM    P-R Interval 136 ms    QRS Duration 94 ms    Q-T Interval 358 ms    QTc Calculation (Bazett) 442 ms    P Axis 79 degrees    R Axis 35 degrees    T Axis 65 degrees       RADIOLOGY: Interpreted by Radiologist unless otherwise noted.  XR CHEST PORTABLE   Final Result   No acute pulmonary process             EKG: As interpreted by this ER  physician.  Rate: 92 bpm  Rhythm: Sinus  Axis: normal  ST Segments: no acute change  T-Waves: no acute change  Interpretation: NSR  Comparison: stable as compared to patient's most recent EKG    Oxygen Saturation Interpretation: Normal    Meds Given:  Medications   ipratropium-albuterol (DUONEB) nebulizer solution 3 ampule (3 ampules Inhalation Given 11/06/19 1336)   magnesium sulfate 2000 mg in 50 mL IVPB premix (0 mg Intravenous Stopped 11/06/19 1645)   albuterol (PROVENTIL) nebulizer solution 2.5 mg (2.5 mg Nebulization Given 11/06/19 1545)   albuterol (PROVENTIL) nebulizer solution 2.5 mg (2.5 mg Nebulization Given 11/06/19 1616)       Procedures:  No  procedures performed.    --------------------------------- PROGRESS NOTES / ADDITIONAL PROVIDER NOTES ---------------------------------  Consultations:  As outlined below.    ED Course:    ED Course as of Nov 07 205   Fri Nov 06, 2019   1511 On reevaluation, the patient is resting comfortably in bed. He has not gotten his breathing treatments yet. We will continue to monitor.     [ML]   1633 Breathing much better after breathing treatments  He would like to go home    [CD]      ED Course User Index  [CD] [place] W Donley, MD  [ML] Arturo MortonMark Aldonia Keeven, [place]       [id number]: All results were discussed with the patient and I have provided specific details regarding the plan of care, diagnosis and associated prognosis. The patient tolerated the visit well and, at the time of discharge, he was without objective evidence of hemodynamic instability or an acute process (biological or psychological) requiring hospitalization and inpatient management. The patient was seen by myself and the assigned attending physician, Dr. Rexanne Manoonley, who agreed with my assessment and plan as laid out herein. The importance of follow-up was discussed at the end of the visit and I recommended that the patient be seen by his primary care physician in 2-3 days. The patient verbalized his understanding and agreement  with the plan as presented and stated his intention to follow up. Reasons to return to the ER or seek immediate evaluation by a medical provider were discussed at length and all questions were answered. The patient was discharged home in stable condition.     MDM:  Patient presented from home complaining of asthma and URI symptoms. On arrival, all vital signs were wnl. EKG and physical exam were as documented above. A work up was initiated to further evaluate his presenting complaints. Labs were remarkable for a negative trop and negative COVID. CXR did not reveal any acute cardiopulmonary pathology. He was treated with duonebs, albuterol and magnesium while in the department and significant symptomatic improvement was attained. Given that, the decision was made to discharge him with recommendations to follow up with a PCP (number provided) as well as with his pulmonologist. The patient was stable at the time of his disposition.      Discharge Medication List as of 11/06/2019  4:55 PM      START taking these medications    Details   predniSONE (DELTASONE) 20 MG tablet Take 3 tablets by mouth daily for 5 days, Disp-15 tablet, R-0Print             Diagnosis:  1. Asthma with acute exacerbation, unspecified asthma severity, unspecified whether persistent        Disposition:  Patient's disposition: Discharge to home  Patient's condition is stable.    This patient was seen, examined and treated with Dr. Rexanne Manoonley. All pertinent aspects of the patient's care were discussed with the attending physic           Arturo MortonMark Kyilee Gregg, DO  Resident  11/08/19 [phone]

## 2019-11-06 NOTE — ED Provider Notes (Signed)
ATTENDING PROVIDER ATTESTATION:     Ray Rodgers presented to the emergency department for evaluation of Asthma (walking home became SOB, got 2Duo nebs, 125 solumed, coughing up flem, back chest and head pressure from coughing, 10/10. out of asthma meds)   and was initially evaluated by the Medical Resident. See Original ED Note for H&P and ED course above.     I have reviewed and discussed the case, including pertinent history (medical, surgical, family and social) and exam findings with the Medical Resident assigned to [facility].  I have personally performed and/or participated in the history, exam, medical decision making, and procedures and agree with all pertinent clinical information and any additional changes or corrections are noted below in my assessment and plan. I have discussed this patient in detail with the resident, and provided the instruction and education,       I have reviewed my findings and recommendations with the assigned Medical Resident, Jozeph Persing and members of family present at the time of disposition.      I have performed a history and physical examination of this patient and directly supervised the resident caring for this patient      History of Present Illness:    Presents to the ED for shortness of breath, beginning prior to arrival.  The complaint has been constant, moderate in severity, and worsened by nothing.  Hx of asthma, reports shortness of breath while walking outside. Reports cough, wheezing, and not moving enough air. Reports similar sx in the past with prior asthma exacerbation. Reports EMS gave nebs, steroids and he is already breathing better.  He reports his lungs felt tight and he fell like his chest was tight secondary to this.  No pain with breathing.  No back pain.  No reported pain.  He reports he is been coughing secondary to his wheezing. No hx of DVT or PE. No pleuritic chest pain.        Review of Systems:   A complete review of systems was  performed and pertinent positives and negatives are stated within HPI, all other systems reviewed and are negative.    --------------------------------------------- PAST HISTORY ---------------------------------------------  Past Medical History:  has a past medical history of Asthma.    Past Surgical History: denies    Social History:  reports that he quit smoking about 2 years ago. His smoking use included cigarettes. He has never used smokeless tobacco. He reports that he does not drink alcohol and does not use drugs.    Family History: family history is not on file.  Unless otherwise noted, family history is non contributory    The patient???s home medications have been reviewed.    Allergies: Patient has no known allergies.    EKG Interpretation  Interpreted by emergency department physician, Dr. Rexanne Mano     11/06/19  Time: 1207    Rhythm: normal sinus   Rate: normal  Axis: normal  Conduction: normal  ST Segments: no acute change  T Waves: no acute change    Clinical Impression: Sinus rhythm, no acute ischemic changes    Comparison to Old EKG  Stable from prior      Physical Exam:  Constitutional/General: Alert and oriented x3  Head: Normocephalic and atraumatic  Eyes: PERRL, EOMI, sclera non icteric  ENT: Oropharynx clear, handling secretions  Neck: Supple, full ROM, no stridor, no meningeal signs  Respiratory: Lungs with diffuse wheezing bilaterally. Not in respiratory distress  Cardiovascular:  Regular rate.  Regular rhythm. No murmurs, no gallops, no rubs. 2+ distal pulses. Equal extremity pulses.   GI:  Abdomen Soft, Non tender, Non distended. No rebound, guarding, or rigidity. No pulsatile masses.  Musculoskeletal: Moves all extremities x 4. Warm and well perfused,  no clubbing, no cyanosis, no edema. Palpable peripheral pulses. No calf tenderness or palpable cords. No leg swelling.   Integument: skin warm and dry. No rashes.   Neurologic: GCS 15, no focal deficits  Psychiatric: Normal Affect      I directly  supervised any procedures performed by the resident and was present for the procedure including all critical portions of the procedure      The cardiac monitor revealed sinus rhythm with a heart rate in the 90s as interpreted by me. The cardiac monitor was ordered secondary to the patient's shortness of breath and asthma exacerbation and to monitor the patient for dysrhythmia.   CPT [id number]      I, Dr. Rexanne Mano, am the primary provider of record    My Medical Decision Making:         Acute asthma exacerbation  Steroids already given by EMS  More nebs, mag and nebs  Breathing improving  Wheezing improving and he is feeling better  He would like to go home as he has family in town and he is feeling better and doesn't want to be admitted.  He says he would return if he gets worse or if anything changes        1. Asthma with acute exacerbation, unspecified asthma severity, unspecified whether persistent           [place] W Aylana Hirschfeld, MD  11/06/19 1650

## 2019-11-07 LAB — EKG 12-LEAD
Atrial Rate: 92 {beats}/min
P Axis: 79 degrees
P-R Interval: 136 ms
Q-T Interval: 358 ms
QRS Duration: 94 ms
QTc Calculation (Bazett): 442 ms
R Axis: 35 degrees
T Axis: 65 degrees
Ventricular Rate: 92 {beats}/min

## 2019-12-02 MED ORDER — FLUTICASONE-SALMETEROL 250-50 MCG/DOSE IN AEPB
250-50 MCG/DOSE | Freq: Two times a day (BID) | RESPIRATORY_TRACT | 1 refills | Status: DC
Start: 2019-12-02 — End: 2020-08-20

## 2019-12-02 MED ORDER — ALBUTEROL SULFATE HFA 108 (90 BASE) MCG/ACT IN AERS
108 (90 Base) MCG/ACT | Freq: Four times a day (QID) | RESPIRATORY_TRACT | 1 refills | Status: DC | PRN
Start: 2019-12-02 — End: 2019-12-07

## 2019-12-07 ENCOUNTER — Emergency Department: Admit: 2019-12-08 | Payer: PRIVATE HEALTH INSURANCE

## 2019-12-07 DIAGNOSIS — J4521 Mild intermittent asthma with (acute) exacerbation: Secondary | ICD-10-CM

## 2019-12-07 NOTE — ED Notes (Signed)
Patient ambulated without any distress, MD aware.      Charlott Rakes, RN  12/07/19 [phone]

## 2019-12-07 NOTE — ED Notes (Signed)
Bed: 24  Expected date:   Expected time:   Means of arrival:   Comments:  Triage     Osvaldo Human, RN  12/07/19 2018

## 2019-12-07 NOTE — ED Provider Notes (Signed)
Chief complaint: Shortness of breath      HPI:  12/07/19, Time: 8:27 PM EDT    HPI               Ray Rodgers is a 39 y.o. male presenting to the ED for shortness of breath.  History is obtained from the patient as well as the patient's medical record.  The patient states her last 3 days he has had nasal congestion as well as a cough and shortness of breath.  Shortness of breath is moderate in severity.  Nothing makes it better.  It is worse with activity and exertion.  There are no alleviating factors.  Did have albuterol at home but ran out the patient denies any chest pain he states that he does feel a tightness in his chest.  He denies any fevers.  He did receive his first dose of Covid vaccine although he does not know which one. The patient has no history of DVT or PE, is not on any hormone replacement therapy, denies any active malignancy, recent surgeries or long periods of travel sitting in a car or plane.  Patient has no history of hypertension, hyperlipidemia, diabetes.  He has a prior smoker.      ROS:   Review of Systems   Constitutional: Negative for chills and fever.   HENT: Negative for congestion.    Eyes: Negative for redness.   Respiratory: Positive for cough, shortness of breath and wheezing.    Cardiovascular: Negative for chest pain.   Gastrointestinal: Negative for abdominal pain, rectal pain and vomiting.   Genitourinary: Negative for dysuria.   Musculoskeletal: Negative for arthralgias.   Skin: Negative for rash.   Neurological: Negative for light-headedness.   Psychiatric/Behavioral: Negative for confusion.   All other systems reviewed and are negative.      --------------------------------------------- PAST HISTORY ---------------------------------------------  Past Medical History:  has a past medical history of Asthma.    Past Surgical History:  has no past surgical history on file.    Social History:  reports that he quit smoking about 2 years ago. His smoking use included cigarettes.  He has never used smokeless tobacco. He reports that he does not drink alcohol and does not use drugs.    Family History: family history is not on file.     The patient???s home medications have been reviewed.    Allergies: Patient has no known allergies.    ---------------------------------------------------PHYSICAL EXAM--------------------------------------      Constitutional/General: Alert and oriented x3, well appearing, non toxic in NAD  Head: Normocephalic and atraumatic  Mouth: Oropharynx clear, handling secretions, no trismus  Neck: Supple, full ROM,  Pulmonary: Mildly diminished breath sounds, slight expiratory wheezing, no rales or rhonchi.  Cardiovascular: Mildly tachycardic rate. Regular rhythm. No murmurs  Chest: no chest wall tenderness  Abdomen: Soft.  Non tender. Non distended.   No rebound, guarding, or rigidity. No pulsatile masses appreciated.  Musculoskeletal: Moves all extremities x 4. Warm and well perfused, no clubbing, cyanosis, or edema. Capillary refill <3 seconds  Skin: warm and dry. No rashes.   Neurologic: GCS 15, no gross focal neurologic deficits  Psych: Normal Affect    -------------------------------------------------- RESULTS -------------------------------------------------  I have personally reviewed all laboratory and imaging results for this patient. Results are listed below.     LABS:  Results for orders placed or performed during the hospital encounter of 12/07/19   CBC Auto Differential   Result Value Ref Range    WBC  10.0 4.5 - 11.5 E9/L    RBC 5.45 3.80 - 5.80 E12/L    Hemoglobin 15.7 12.5 - 16.5 g/dL    Hematocrit 78.6 76.7 - 54.0 %    MCV 84.0 80.0 - 99.9 fL    MCH 28.8 26.0 - 35.0 pg    MCHC 34.3 32.0 - 34.5 %    RDW 13.2 11.5 - 15.0 fL    Platelets 268 130 - 450 E9/L    MPV 10.4 7.0 - 12.0 fL    Neutrophils % 83.1 (H) 43.0 - 80.0 %    Immature Granulocytes % 0.5 0.0 - 5.0 %    Lymphocytes % 10.4 (L) 20.0 - 42.0 %    Monocytes % 5.8 2.0 - 12.0 %    Eosinophils % 0.0 0.0 -  6.0 %    Basophils % 0.2 0.0 - 2.0 %    Neutrophils Absolute 8.28 (H) 1.80 - 7.30 E9/L    Immature Granulocytes # 0.05 E9/L    Lymphocytes Absolute 1.04 (L) 1.50 - 4.00 E9/L    Monocytes Absolute 0.58 0.10 - 0.95 E9/L    Eosinophils Absolute 0.00 (L) 0.05 - 0.50 E9/L    Basophils Absolute 0.02 0.00 - 0.20 E9/L   Comprehensive Metabolic Panel w/ Reflex to MG   Result Value Ref Range    Sodium 140 132 - 146 mmol/L    Potassium reflex Magnesium 4.2 3.5 - 5.0 mmol/L    Chloride 103 98 - 107 mmol/L    CO2 26 22 - 29 mmol/L    Anion Gap 11 7 - 16 mmol/L    Glucose 131 (H) 74 - 99 mg/dL    BUN 15 6 - 20 mg/dL    CREATININE 1.0 0.7 - 1.2 mg/dL    GFR Non-African American >60 >=60 mL/min/1.73    GFR African American >60     Calcium 9.4 8.6 - 10.2 mg/dL    Total Protein 7.5 6.4 - 8.3 g/dL    Albumin 4.3 3.5 - 5.2 g/dL    Total Bilirubin <2.0 0.0 - 1.2 mg/dL    Alkaline Phosphatase 117 40 - 129 U/L    ALT 32 0 - 40 U/L    AST 25 0 - 39 U/L   Troponin   Result Value Ref Range    Troponin, High Sensitivity <6 0 - 11 ng/L   D-Dimer, Quantitative   Result Value Ref Range    D-Dimer, Quant <200 ng/mL DDU       RADIOLOGY:  Interpreted by Radiologist.  XR CHEST PORTABLE   Final Result   No acute process.               EKG:  This EKG is signed and interpreted by me.    Normal sinus rhythm, rate of 90, no ST segment elevation or depression, PR interval 142 MS, QRS 86 MS, QTC 416 MS, stable compared to patient's prior EKG  Interpreted by me      ------------------------- NURSING NOTES AND VITALS REVIEWED ---------------------------   The nursing notes within the ED encounter and vital signs as below have been reviewed by myself.  BP (!) 152/78    Pulse 115    Temp 97 ??F (36.1 ??C)    Resp 20    Ht 6' (1.829 m)    Wt 187 lb (84.8 kg)    SpO2 92%    BMI 25.36 kg/m??   Oxygen Saturation Interpretation: Normal    The patient???s available past medical records  and past encounters were reviewed.        ------------------------------ ED  COURSE/MEDICAL DECISION MAKING----------------------  Medications   magnesium sulfate 2000 mg in 50 mL IVPB premix (2,000 mg Intravenous New Bag 12/07/19 2103)   ipratropium-albuterol (DUONEB) nebulizer solution 3 ampule (3 ampules Inhalation Given 12/07/19 2054)   methylPREDNISolone sodium (SOLU-MEDROL) injection 125 mg (125 mg Intravenous Given 12/07/19 2106)             Medical Decision Making:   I, Dr. Maisie Fus am the primary physician of record.    Fenris Cauble is a 39 y.o. male who presents to the ED for shortness of breath.  Patient does have ahistory asthma.  Differential diagnosis close but not limited to pneumonia, pneumothorax, asthma exacerbation.  The patient did have labs and imaging which were reviewed.  The patient was given duo nebs, Solu-Medrol as well as magnesium on arrival.  Patient did have significant improvement in symptoms.  The patient has CBC, CMP, troponin as well as D-dimer which were unremarkable, EKG with no ischemic changes, chest x-ray with no acute process.  Patient was able to ambulate without difficulty in the emergency department.  The patient was given albuterol for home as he did run out as well as prednisone.        Re-Evaluations/Consultations:             ED Course as of Dec 06 2232   Mon Dec 07, 2019   2230 Patient is in the bed in no acute distress.  The results of today were discussed with the patient at bedside.  The patient states he does feel significant improvement since arrival to the emergency department.  Patient is comfortable with discharge home.  He will be ambulated at this time.    [MT]      ED Course User Index  [MT] Bronson Curb, DO               This patient's ED course included: History, physical examination, reevaluation prior to disposition, labs, imaging, telemetry monitoring, EKG, Duoneb, solumedrol, magnesium      This patient has remained hemodynamically stable during their ED course.    Counseling:   The emergency provider has spoken with the  patient and discussed today???s results, in addition to providing specific details for the plan of care and counseling regarding the diagnosis and prognosis.  Questions are answered at this time and they are agreeable with the plan.       --------------------------------- IMPRESSION AND DISPOSITION ---------------------------------    IMPRESSION  1. Exacerbation of intermittent asthma, unspecified asthma severity        DISPOSITION  Disposition: Discharge to home  Patient condition is stable        NOTE: This report was transcribed using voice recognition software. Every effort was made to ensure accuracy; however, inadvertent computerized transcription errors may be present         Bronson Curb, DO  12/10/19 1046

## 2019-12-08 ENCOUNTER — Inpatient Hospital Stay
Admit: 2019-12-08 | Discharge: 2019-12-08 | Disposition: A | Discharge status: Home / Self Care | Payer: PRIVATE HEALTH INSURANCE | Attending: Emergency Medicine

## 2019-12-08 LAB — CBC WITH AUTO DIFFERENTIAL
Basophils %: 0.2 % (ref 0.0–2.0)
Basophils Absolute: 0.02 E9/L (ref 0.00–0.20)
Eosinophils %: 0 % (ref 0.0–6.0)
Eosinophils Absolute: 0 E9/L — ABNORMAL LOW (ref 0.05–0.50)
Hematocrit: 45.8 % (ref 37.0–54.0)
Hemoglobin: 15.7 g/dL (ref 12.5–16.5)
Immature Granulocytes #: 0.05 E9/L
Immature Granulocytes %: 0.5 % (ref 0.0–5.0)
Lymphocytes %: 10.4 % — ABNORMAL LOW (ref 20.0–42.0)
Lymphocytes Absolute: 1.04 E9/L — ABNORMAL LOW (ref 1.50–4.00)
MCH: 28.8 pg (ref 26.0–35.0)
MCHC: 34.3 % (ref 32.0–34.5)
MCV: 84 fL (ref 80.0–99.9)
MPV: 10.4 fL (ref 7.0–12.0)
Monocytes %: 5.8 % (ref 2.0–12.0)
Monocytes Absolute: 0.58 E9/L (ref 0.10–0.95)
Neutrophils %: 83.1 % — ABNORMAL HIGH (ref 43.0–80.0)
Neutrophils Absolute: 8.28 E9/L — ABNORMAL HIGH (ref 1.80–7.30)
Platelets: 268 E9/L (ref 130–450)
RBC: 5.45 E12/L (ref 3.80–5.80)
RDW: 13.2 fL (ref 11.5–15.0)
WBC: 10 E9/L (ref 4.5–11.5)

## 2019-12-08 LAB — COMPREHENSIVE METABOLIC PANEL W/ REFLEX TO MG FOR LOW K
ALT: 32 U/L (ref 0–40)
AST: 25 U/L (ref 0–39)
Albumin: 4.3 g/dL (ref 3.5–5.2)
Alkaline Phosphatase: 117 U/L (ref 40–129)
Anion Gap: 11 mmol/L (ref 7–16)
BUN: 15 mg/dL (ref 6–20)
CO2: 26 mmol/L (ref 22–29)
Calcium: 9.4 mg/dL (ref 8.6–10.2)
Chloride: 103 mmol/L (ref 98–107)
Creatinine: 1 mg/dL (ref 0.7–1.2)
GFR African American: >60
GFR Non-African American: >60 mL/min/{1.73_m2} (ref >=60)
Glucose: 131 mg/dL — ABNORMAL HIGH (ref 74–99)
Potassium reflex Magnesium: 4.2 mmol/L (ref 3.5–5.0)
Sodium: 140 mmol/L (ref 132–146)
Total Bilirubin: <0.2 mg/dL (ref 0.0–1.2)
Total Protein: 7.5 g/dL (ref 6.4–8.3)

## 2019-12-08 LAB — D-DIMER, QUANTITATIVE: D-Dimer, Quant: <200 ng/mL DDU

## 2019-12-08 LAB — TROPONIN: Troponin, High Sensitivity: <6 ng/L (ref 0–11)

## 2019-12-08 MED ORDER — METHYLPREDNISOLONE SODIUM SUCC 125 MG IJ SOLR
125 MG | Freq: Once | INTRAMUSCULAR | Status: AC
Start: 2019-12-08 — End: 2019-12-07
  Administered 2019-12-08: 01:00:00 125 mg via INTRAVENOUS

## 2019-12-08 MED ORDER — IPRATROPIUM-ALBUTEROL 0.5-2.5 (3) MG/3ML IN SOLN
Freq: Once | RESPIRATORY_TRACT | Status: AC
Start: 2019-12-08 — End: 2019-12-07
  Administered 2019-12-08: 01:00:00 3 via RESPIRATORY_TRACT

## 2019-12-08 MED ORDER — MAGNESIUM SULFATE 2000 MG/50 ML IVPB PREMIX
2 GM/50ML | Freq: Once | INTRAVENOUS | Status: AC
Start: 2019-12-08 — End: 2019-12-07
  Administered 2019-12-08: 01:00:00 2000 mg via INTRAVENOUS

## 2019-12-08 MED ORDER — PREDNISONE 50 MG PO TABS
50 MG | ORAL_TABLET | ORAL | 0 refills | Status: DC
Start: 2019-12-08 — End: 2020-08-20

## 2019-12-08 MED ORDER — ALBUTEROL SULFATE HFA 108 (90 BASE) MCG/ACT IN AERS
108 (90 Base) MCG/ACT | RESPIRATORY_TRACT | 1 refills | Status: DC | PRN
Start: 2019-12-08 — End: 2020-08-20

## 2019-12-08 MED FILL — SOLU-MEDROL 125 MG IJ SOLR: 125 mg | INTRAMUSCULAR | Qty: 125

## 2019-12-08 MED FILL — MAGNESIUM SULFATE 2 GM/50ML IV SOLN: 2 GM/50ML | INTRAVENOUS | Qty: 50

## 2019-12-08 MED FILL — IPRATROPIUM-ALBUTEROL 0.5-2.5 (3) MG/3ML IN SOLN: 0.5-2.5 (3) MG/3ML | RESPIRATORY_TRACT | Qty: 9

## 2019-12-09 LAB — EKG 12-LEAD
Atrial Rate: 104 {beats}/min
P Axis: 74 degrees
P-R Interval: 114 ms
Q-T Interval: 330 ms
QRS Duration: 88 ms
QTc Calculation (Bazett): 433 ms
R Axis: 34 degrees
T Axis: 54 degrees
Ventricular Rate: 104 {beats}/min

## 2020-07-13 ENCOUNTER — Inpatient Hospital Stay
Admit: 2020-07-13 | Discharge: 2020-07-13 | Disposition: A | Discharge status: Home / Self Care | Payer: PRIVATE HEALTH INSURANCE | Attending: Emergency Medicine

## 2020-07-13 DIAGNOSIS — H6503 Acute serous otitis media, bilateral: Secondary | ICD-10-CM

## 2020-07-13 MED ORDER — IBUPROFEN 800 MG PO TABS
800 MG | ORAL_TABLET | Freq: Three times a day (TID) | ORAL | 0 refills | Status: DC | PRN
Start: 2020-07-13 — End: 2020-07-13

## 2020-07-13 MED ORDER — AMOXICILLIN-POT CLAVULANATE 875-125 MG PO TABS
875-125 MG | ORAL_TABLET | Freq: Two times a day (BID) | ORAL | 0 refills | Status: DC
Start: 2020-07-13 — End: 2020-07-13

## 2020-07-13 MED ORDER — AMOXICILLIN-POT CLAVULANATE 875-125 MG PO TABS
875-125 MG | Freq: Once | ORAL | Status: AC
Start: 2020-07-13 — End: 2020-07-13
  Administered 2020-07-13: 11:00:00 via ORAL

## 2020-07-13 MED ORDER — IBUPROFEN 800 MG PO TABS
800 MG | Freq: Once | ORAL | Status: AC
Start: 2020-07-13 — End: 2020-07-13
  Administered 2020-07-13: 11:00:00 via ORAL

## 2020-07-13 MED ORDER — AMOXICILLIN-POT CLAVULANATE 875-125 MG PO TABS
875-125 MG | ORAL_TABLET | Freq: Two times a day (BID) | ORAL | 0 refills | Status: AC
Start: 2020-07-13 — End: 2020-07-23

## 2020-07-13 MED ORDER — IBUPROFEN 800 MG PO TABS
800 MG | ORAL_TABLET | Freq: Three times a day (TID) | ORAL | 0 refills | Status: DC | PRN
Start: 2020-07-13 — End: 2020-08-20

## 2020-07-13 MED FILL — AMOXICILLIN-POT CLAVULANATE 875-125 MG PO TABS: 875-125 mg | ORAL | Qty: 1

## 2020-07-13 MED FILL — IBUPROFEN 800 MG PO TABS: 800 mg | ORAL | Qty: 1

## 2020-07-13 NOTE — ED Provider Notes (Signed)
Department of Emergency Medicine   ED  Provider Note  Admit Date/RoomTime: 07/13/2020  7:00 AM  ED Room: ST01/ST-01          History of Present Illness:  07/13/20, Time: 7:12 AM EDT  Chief Complaint   Patient presents with   ??? Otalgia     Ear pain 10/10 pain         Ray Rodgers is a 40 y.o. male presenting to the ED for Ear pain bilaterally.  This is been present for 2 to 3 days.  Nothing to better or worse.  Is mild to moderate in severity.  The patient denies any nausea or emesis.  Denies fevers chills or myalgias.  He has a mild dry cough intermittently but is a chronic smoker.  This is baseline for him.  He is bringing up a small amount of mucus.  Denies any history of otitis media in the past.  Denies any headaches.  He has no neck pain.    Review of Systems:  Review of systems obtained and negative unless stated otherwise above in the HPI.    --------------------------------------------- PAST HISTORY ---------------------------------------------  Past Medical History:  has a past medical history of Asthma.    Past Surgical History:  has no past surgical history on file.    Social History:  reports that he quit smoking about 2 years ago. His smoking use included cigarettes. He has never used smokeless tobacco. He reports that he does not drink alcohol and does not use drugs.    Family History: family history is not on file.. Unless otherwise noted, family history is non contributory    The patient???s home medications have been reviewed.    Allergies: Patient has no known allergies.    I have reviewed the past medical history, past surgical history, social history, and family history    ---------------------------------------------------PHYSICAL EXAM--------------------------------------    Constitutional: Appears in no distress  Head: Normocephalic  Eyes: No conjunctial injection  ENT: Bilateral erythematous and bulging tympanic membranes with blurring of the cone of light, there is no pus or drainage  from the external auditory canal, there is no mastoid tenderness bilaterally, there is no pain to palpation of the tragus.  No tympanic membrane perforation is appreciated, no pain to palpation of the maxillary or frontal sinuses, the posterior oropharynx is without erythema, there is no tonsillar exudate or swelling noted  Neck: Trachea midline, no cervical supraclavicular or submandibular lymphadenopathy is appreciated  Respiratory: Nonlabored respirations, no tachypnea  Cardiovascular: Regular rate. Regular rhythm. No murmurs, no gallops, no rubs.  Gastrointestinal: Nonsistendended abdomen.  Musculoskeletal: Moves all extremities  Skin: No diaphoresis on visualized skin  Neurologic: Alert, symmetric facies, no aphasia  Psychiatric: Acting appropriately    -------------------------------------------------- RESULTS -------------------------------------------------  I have personally reviewed all laboratory and imaging results for this patient. Results are listed below.     LABS: (Lab results interpreted by me)  No results found for this visit on 07/13/20.,       RADIOLOGY:  Interpreted by Radiologist unless otherwise specified  No orders to display         ------------------------- NURSING NOTES AND VITALS REVIEWED ---------------------------   The nursing notes within the ED encounter and vital signs as below have been reviewed by myself  BP (!) 176/90    Pulse 80    Temp 97.5 ??F (36.4 ??C)    Resp 17    SpO2 97%     Oxygen Saturation Interpretation: Normal  The patient???s available past medical records and past encounters were reviewed.        ------------------------------ ED COURSE/MEDICAL DECISION MAKING----------------------  Medications   amoxicillin-clavulanate (AUGMENTIN) 875-125 MG per tablet 1 tablet (has no administration in time range)   ibuprofen (ADVIL;MOTRIN) tablet 800 mg (has no administration in time range)        Re-Evaluations:         This patient's ED course included:a personal history and  physicial examination and re-evaluation prior to disposition    This patient has remained hemodynamically stable during their ED course.      Consultations:  None    Medical Decision Making:   Patient presents emergency department with evidence consistent with bilateral otitis media.  He will be started on Augmentin.  He is given return precautions.  He was given a work note as well.  I have no clinical suspicion at this time of mastoiditis, malignant otitis externa, patient has no clinical evidence of pneumonia.  He started on pain medication.      Counseling:   The emergency provider has spoken with the patient and discussed today???s results, in addition to providing specific details for the plan of care and counseling regarding the diagnosis and prognosis.  Questions are answered at this time and they are agreeable with the plan.       --------------------------------- IMPRESSION AND DISPOSITION ---------------------------------    IMPRESSION  1. Non-recurrent acute serous otitis media of both ears        DISPOSITION  Disposition: Discharge to home  Patient condition is stable    I, Dr. Viona Gilmore, am the primary provider of record    Viona Gilmore, DO  Emergency Medicine    NOTE: This report was transcribed using voice recognition software. Every effort was made to ensure accuracy; however, inadvertent computerized transcription errors may be present         Sharen Hint, DO  07/13/20 0715

## 2020-08-20 ENCOUNTER — Inpatient Hospital Stay
Admit: 2020-08-20 | Discharge: 2020-08-20 | Disposition: A | Discharge status: Home / Self Care | Payer: PRIVATE HEALTH INSURANCE | Attending: Emergency Medicine

## 2020-08-20 ENCOUNTER — Emergency Department: Admit: 2020-08-20 | Payer: PRIVATE HEALTH INSURANCE

## 2020-08-20 DIAGNOSIS — J45901 Unspecified asthma with (acute) exacerbation: Secondary | ICD-10-CM

## 2020-08-20 LAB — TROPONIN
Troponin, High Sensitivity: 23 ng/L — ABNORMAL HIGH (ref 0–11)
Troponin, High Sensitivity: 19 ng/L — ABNORMAL HIGH (ref 0–11)

## 2020-08-20 LAB — CBC WITH AUTO DIFFERENTIAL
Basophils %: 1.2 % (ref 0.0–2.0)
Basophils Absolute: 0.07 E9/L (ref 0.00–0.20)
Eosinophils %: 8 % — ABNORMAL HIGH (ref 0.0–6.0)
Eosinophils Absolute: 0.45 E9/L (ref 0.05–0.50)
Hematocrit: 49.3 % (ref 37.0–54.0)
Hemoglobin: 15.8 g/dL (ref 12.5–16.5)
Immature Granulocytes #: 0.01 E9/L
Immature Granulocytes %: 0.2 % (ref 0.0–5.0)
Lymphocytes %: 37.7 % (ref 20.0–42.0)
Lymphocytes Absolute: 2.12 E9/L (ref 1.50–4.00)
MCH: 28.2 pg (ref 26.0–35.0)
MCHC: 32 % (ref 32.0–34.5)
MCV: 87.9 fL (ref 80.0–99.9)
MPV: 10.1 fL (ref 7.0–12.0)
Monocytes %: 7.1 % (ref 2.0–12.0)
Monocytes Absolute: 0.4 E9/L (ref 0.10–0.95)
Neutrophils %: 45.8 % (ref 43.0–80.0)
Neutrophils Absolute: 2.58 E9/L (ref 1.80–7.30)
Platelets: 221 E9/L (ref 130–450)
RBC: 5.61 E12/L (ref 3.80–5.80)
RDW: 13.8 fL (ref 11.5–15.0)
WBC: 5.6 E9/L (ref 4.5–11.5)

## 2020-08-20 LAB — MAGNESIUM: Magnesium: 2.2 mg/dL (ref 1.6–2.6)

## 2020-08-20 LAB — COMPREHENSIVE METABOLIC PANEL
ALT: 17 U/L (ref 0–40)
AST: 25 U/L (ref 0–39)
Albumin: 3.8 g/dL (ref 3.5–5.2)
Alkaline Phosphatase: 93 U/L (ref 40–129)
Anion Gap: 8 mmol/L (ref 7–16)
BUN: 20 mg/dL (ref 6–20)
CO2: 26 mmol/L (ref 22–29)
Calcium: 8.5 mg/dL — ABNORMAL LOW (ref 8.6–10.2)
Chloride: 101 mmol/L (ref 98–107)
Creatinine: 1.1 mg/dL (ref 0.7–1.2)
GFR African American: >60
GFR Non-African American: >60 mL/min/{1.73_m2} (ref >=60)
Glucose: 72 mg/dL — ABNORMAL LOW (ref 74–99)
Potassium: 4.3 mmol/L (ref 3.5–5.0)
Sodium: 135 mmol/L (ref 132–146)
Total Bilirubin: 0.2 mg/dL (ref 0.0–1.2)
Total Protein: 5.9 g/dL — ABNORMAL LOW (ref 6.4–8.3)

## 2020-08-20 MED ORDER — ASPIRIN 81 MG PO CHEW
81 MG | Freq: Once | ORAL | Status: AC
Start: 2020-08-20 — End: 2020-08-20
  Administered 2020-08-20: 08:00:00 via ORAL

## 2020-08-20 MED ORDER — METHYLPREDNISOLONE SODIUM SUCC 125 MG IJ SOLR
125 MG | Freq: Once | INTRAMUSCULAR | Status: AC
Start: 2020-08-20 — End: 2020-08-20
  Administered 2020-08-20: 07:00:00 via INTRAVENOUS

## 2020-08-20 MED ORDER — ALBUTEROL SULFATE HFA 108 (90 BASE) MCG/ACT IN AERS
108 (90 Base) MCG/ACT | RESPIRATORY_TRACT | 1 refills | Status: DC | PRN
Start: 2020-08-20 — End: 2020-09-29

## 2020-08-20 MED ORDER — PREDNISONE 10 MG PO TABS
10 MG | ORAL_TABLET | ORAL | 0 refills | Status: DC
Start: 2020-08-20 — End: 2020-08-20

## 2020-08-20 MED ORDER — PREDNISONE 10 MG PO TABS
10 MG | ORAL_TABLET | ORAL | 0 refills | Status: AC
Start: 2020-08-20 — End: 2020-08-30

## 2020-08-20 MED ORDER — FLUTICASONE-SALMETEROL 250-50 MCG/ACT IN AEPB
250-50 MCG/ACT | Freq: Two times a day (BID) | RESPIRATORY_TRACT | 3 refills | Status: DC
Start: 2020-08-20 — End: 2020-08-20

## 2020-08-20 MED ORDER — IPRATROPIUM-ALBUTEROL 0.5-2.5 (3) MG/3ML IN SOLN
Freq: Once | RESPIRATORY_TRACT | Status: AC
Start: 2020-08-20 — End: 2020-08-20
  Administered 2020-08-20: 07:00:00 via RESPIRATORY_TRACT

## 2020-08-20 MED ORDER — FLUTICASONE-SALMETEROL 250-50 MCG/ACT IN AEPB
250-50 MCG/ACT | Freq: Two times a day (BID) | RESPIRATORY_TRACT | 3 refills | Status: DC
Start: 2020-08-20 — End: 2020-09-29

## 2020-08-20 MED FILL — IPRATROPIUM-ALBUTEROL 0.5-2.5 (3) MG/3ML IN SOLN: 0.5-2.5 (3) MG/3ML | RESPIRATORY_TRACT | Qty: 9

## 2020-08-20 MED FILL — SOLU-MEDROL 125 MG IJ SOLR: 125 mg | INTRAMUSCULAR | Qty: 125

## 2020-08-20 MED FILL — ASPIRIN 81 MG PO CHEW: 81 mg | ORAL | Qty: 4

## 2020-08-20 NOTE — ED Notes (Signed)
Pt RC and return to work found in room. Called cell number listed in chart message left on cell number.      Drinda Butts, RN  08/20/20 [phone]

## 2020-08-20 NOTE — ED Provider Notes (Signed)
ED Physician   HPI:  08/20/20, Time: 2:16 AM EDT         Ray Rodgers is a 40 y.o. male presenting to the ED for worsening shortness of breath second to asthma attack.  Patient presents to the emergency department with complaints of shortness of breath that he reports started around 10 PM.  Patient does report a history of asthma, he reports that he has been out of his albuterol inhaler.  Patient does report pleuritic associated chest pain that started shortly after his asthma attack and does report bringing up yellow-colored sputum.  He denies fevers.  Denies abdominal pain as well as no noted nausea, vomiting or diarrhea.  Patient reports being a current smoker.  He reports never needing to be intubated as well as requiring BiPAP with his asthma.  Patient reports otherwise normal state of health up until the start of an asthma attack.  Patient was provided with DuoNeb treatment by EMS in route.  Patient with symptoms moderate in severity and persistent.  Patient has seen a pulmonologist, Dr. Anselm Jungling  but admits that he has not gone to the office since the end of last year 2021  Review of Systems:   A complete review of systems was performed and pertinent positives and negatives are stated within HPI, all other systems reviewed and are negative.          --------------------------------------------- PAST HISTORY ---------------------------------------------  Past Medical History:  has a past medical history of Asthma.    Past Surgical History:  has no past surgical history on file.    Social History:  reports that he quit smoking about 2 years ago. His smoking use included cigarettes. He has never used smokeless tobacco. He reports that he does not drink alcohol and does not use drugs.    Family History: family history is not on file.     The patient???s home medications have been reviewed.    Allergies: Patient has no known allergies.    -------------------------------------------------- RESULTS  -------------------------------------------------  All laboratory and radiology results have been personally reviewed by myself   LABS:  Results for orders placed or performed during the hospital encounter of 08/20/20   Troponin   Result Value Ref Range    Troponin, High Sensitivity 23 (H) 0 - 11 ng/L   CBC with Auto Differential   Result Value Ref Range    WBC 5.6 4.5 - 11.5 E9/L    RBC 5.61 3.80 - 5.80 E12/L    Hemoglobin 15.8 12.5 - 16.5 g/dL    Hematocrit 53.6 46.8 - 54.0 %    MCV 87.9 80.0 - 99.9 fL    MCH 28.2 26.0 - 35.0 pg    MCHC 32.0 32.0 - 34.5 %    RDW 13.8 11.5 - 15.0 fL    Platelets 221 130 - 450 E9/L    MPV 10.1 7.0 - 12.0 fL    Neutrophils % 45.8 43.0 - 80.0 %    Immature Granulocytes % 0.2 0.0 - 5.0 %    Lymphocytes % 37.7 20.0 - 42.0 %    Monocytes % 7.1 2.0 - 12.0 %    Eosinophils % 8.0 (H) 0.0 - 6.0 %    Basophils % 1.2 0.0 - 2.0 %    Neutrophils Absolute 2.58 1.80 - 7.30 E9/L    Immature Granulocytes # 0.01 E9/L    Lymphocytes Absolute 2.12 1.50 - 4.00 E9/L    Monocytes Absolute 0.40 0.10 - 0.95 E9/L  Eosinophils Absolute 0.45 0.05 - 0.50 E9/L    Basophils Absolute 0.07 0.00 - 0.20 E9/L   Comprehensive Metabolic Panel   Result Value Ref Range    Sodium 135 132 - 146 mmol/L    Potassium 4.3 3.5 - 5.0 mmol/L    Chloride 101 98 - 107 mmol/L    CO2 26 22 - 29 mmol/L    Anion Gap 8 7 - 16 mmol/L    Glucose 72 (L) 74 - 99 mg/dL    BUN 20 6 - 20 mg/dL    CREATININE 1.1 0.7 - 1.2 mg/dL    GFR Non-African American >60 >=60 mL/min/1.73    GFR African American >60     Calcium 8.5 (L) 8.6 - 10.2 mg/dL    Total Protein 5.9 (L) 6.4 - 8.3 g/dL    Albumin 3.8 3.5 - 5.2 g/dL    Total Bilirubin 0.2 0.0 - 1.2 mg/dL    Alkaline Phosphatase 93 40 - 129 U/L    ALT 17 0 - 40 U/L    AST 25 0 - 39 U/L   Magnesium   Result Value Ref Range    Magnesium 2.2 1.6 - 2.6 mg/dL   Troponin   Result Value Ref Range    Troponin, High Sensitivity 19 (H) 0 - 11 ng/L       RADIOLOGY:  Interpreted by Radiologist.  XR CHEST PORTABLE    Final Result   No acute cardiopulmonary process.             ------------------------- NURSING NOTES AND VITALS REVIEWED ---------------------------   The nursing notes within the ED encounter and vital signs as below have been reviewed.   BP 111/78    Pulse 78    Temp 97.1 ??F (36.2 ??C)    Resp 19    Ht 6' (1.829 m)    Wt 187 lb (84.8 kg)    SpO2 99%    BMI 25.36 kg/m??   Oxygen Saturation Interpretation: Improved after treatment      ---------------------------------------------------PHYSICAL EXAM--------------------------------------      Constitutional/General: Alert and oriented x3, moderately uncomfortable  Head: Normocephalic and atraumatic  Eyes: PERRL, EOMI  Mouth: Oropharynx clear, handling secretions, no trismus  Neck: Supple, full ROM,   Pulmonary: Lungs with expiratory wheezing throughout all fields posteriorly.  Cardiovascular:  Regular rate and rhythm, no murmurs, gallops, or rubs. 2+ distal pulses  Abdomen: Soft, non tender, non distended,   Extremities: Moves all extremities x 4. Warm and well perfused  Skin: warm and dry without rash  Neurologic: GCS 15, cranial nerves II through XII grossly intact place no acute neurovascular deficit noted.  Speech clear and coherent strength strong and equal bilaterally  Psych: Normal Affect      ------------------------------ ED COURSE/MEDICAL DECISION MAKING----------------------  Medications   ipratropium-albuterol (DUONEB) nebulizer solution 3 ampule (3 ampules Inhalation Given 08/20/20 0230)   methylPREDNISolone sodium (SOLU-MEDROL) injection 125 mg (125 mg IntraVENous Given 08/20/20 0232)   aspirin chewable tablet 324 mg (324 mg Oral Given 08/20/20 0415)         ED COURSE:  ED Course as of 08/20/20 0500   Sat Aug 20, 2020   0457 On reevaluation patient appears asymptomatic.  Vital signs are stable.  Delta negative on patient's troponin.  Patient stable for outpatient treatment.  Return precautions given.  Patient agrees with plan. [CB]   0459 EKGEKG interpreted  by emergency physicianEKG shows normal sinus rhythm 74 bpm.  Normal axis.  Normal QRS.  No STEMI. [CB]      ED Course User Index  [CB] Brooke Dare, DO       Medical Decision Making:    Plan be for labs will also obtain imaging and medicate for symptom relief.  Labs resulted troponin is elevated at 23.  Patient did report chest pain but states that it started after his asthma attack and appeared pleuritic as he appeared very tight.  And would worsen with his cough.  Will obtain repeat troponin.  Patient does not have any history of hypertension, hyperlipidemia, diabetes mellitus.  He denies any cardiac testing.  Patient also denies any unexplained cardiac deaths in his family under the age of 34.  Patient was CBC negative, chemistry panel unremarkable, magnesium level normal, chest x-ray negative.  Will obtain repeat troponin.  Patient did receive DuoNeb treatments as well as IV Solu-Medrol here.  We will also provide patient with 4 baby aspirin and obtain a repeat troponin and reevaluate.  Patient on reevaluation after the DuoNeb treatment reports actually feeling markedly improved, he denies any type of chest pain/pleuritic pain, lungs are now clear.  Heart rate at 72 oxygen level 99% on room air.  Delta negative on patient's troponin.  Patient asymptomatic on reevaluation patient stable for discharge.  Return precautions given.  Patient agrees with plan.    Counseling:   The emergency provider has spoken with the patient and discussed today???s results, in addition to providing specific details for the plan of care and counseling regarding the diagnosis and prognosis.  Questions are answered at this time and they are agreeable with the plan.      --------------------------------- IMPRESSION AND DISPOSITION ---------------------------------    IMPRESSION  1. Moderate asthma with exacerbation, unspecified whether persistent        DISPOSITION  Disposition: Discharge home  Patient condition is good      NOTE:  This report was transcribed using voice recognition software. Every effort was made to ensure accuracy; however, inadvertent computerized transcription errors may be present     Ellery Plunk, APRN - CNP  08/20/20 0409       Brooke Dare, DO  08/20/20 0500

## 2020-08-22 LAB — EKG 12-LEAD
Atrial Rate: 74 {beats}/min
P Axis: 81 degrees
P-R Interval: 136 ms
Q-T Interval: 378 ms
QRS Duration: 90 ms
QTc Calculation (Bazett): 419 ms
R Axis: 71 degrees
T Axis: 65 degrees
Ventricular Rate: 74 {beats}/min

## 2020-09-29 ENCOUNTER — Inpatient Hospital Stay
Admit: 2020-09-29 | Discharge: 2020-09-29 | Disposition: A | Discharge status: Home / Self Care | Payer: PRIVATE HEALTH INSURANCE | Attending: Emergency Medicine

## 2020-09-29 ENCOUNTER — Emergency Department: Admit: 2020-09-29 | Payer: PRIVATE HEALTH INSURANCE

## 2020-09-29 DIAGNOSIS — J4532 Mild persistent asthma with status asthmaticus: Secondary | ICD-10-CM

## 2020-09-29 DIAGNOSIS — J4521 Mild intermittent asthma with (acute) exacerbation: Secondary | ICD-10-CM

## 2020-09-29 LAB — BRAIN NATRIURETIC PEPTIDE: Pro-BNP: 188 pg/mL — ABNORMAL HIGH (ref 0–125)

## 2020-09-29 LAB — BASIC METABOLIC PANEL W/ REFLEX TO MG FOR LOW K
Anion Gap: 9 mmol/L (ref 7–16)
BUN: 16 mg/dL (ref 6–20)
CO2: 26 mmol/L (ref 22–29)
Calcium: 8.4 mg/dL — ABNORMAL LOW (ref 8.6–10.2)
Chloride: 109 mmol/L — ABNORMAL HIGH (ref 98–107)
Creatinine: 1.1 mg/dL (ref 0.7–1.2)
GFR African American: >60
GFR Non-African American: >60 mL/min/{1.73_m2} (ref >=60)
Glucose: 76 mg/dL (ref 74–99)
Potassium reflex Magnesium: 3.9 mmol/L (ref 3.5–5.0)
Sodium: 144 mmol/L (ref 132–146)

## 2020-09-29 LAB — CBC WITH AUTO DIFFERENTIAL
Basophils %: 1.6 % (ref 0.0–2.0)
Basophils Absolute: 0.1 E9/L (ref 0.00–0.20)
Eosinophils %: 5.7 % (ref 0.0–6.0)
Eosinophils Absolute: 0.37 E9/L (ref 0.05–0.50)
Hematocrit: 46.8 % (ref 37.0–54.0)
Hemoglobin: 15.1 g/dL (ref 12.5–16.5)
Immature Granulocytes #: 0.01 E9/L
Immature Granulocytes %: 0.2 % (ref 0.0–5.0)
Lymphocytes %: 28.1 % (ref 20.0–42.0)
Lymphocytes Absolute: 1.81 E9/L (ref 1.50–4.00)
MCH: 28.3 pg (ref 26.0–35.0)
MCHC: 32.3 % (ref 32.0–34.5)
MCV: 87.6 fL (ref 80.0–99.9)
MPV: 10.1 fL (ref 7.0–12.0)
Monocytes %: 7.5 % (ref 2.0–12.0)
Monocytes Absolute: 0.48 E9/L (ref 0.10–0.95)
Neutrophils %: 56.9 % (ref 43.0–80.0)
Neutrophils Absolute: 3.67 E9/L (ref 1.80–7.30)
Platelets: 219 E9/L (ref 130–450)
RBC: 5.34 E12/L (ref 3.80–5.80)
RDW: 14 fL (ref 11.5–15.0)
WBC: 6.4 E9/L (ref 4.5–11.5)

## 2020-09-29 LAB — TROPONIN: Troponin, High Sensitivity: 9 ng/L (ref 0–11)

## 2020-09-29 LAB — COVID-19, RAPID: SARS-CoV-2, NAAT: NOT DETECTED

## 2020-09-29 LAB — RAPID INFLUENZA A/B ANTIGENS
Influenza A by PCR: NOT DETECTED
Influenza B by PCR: NOT DETECTED

## 2020-09-29 MED ORDER — FLUTICASONE-SALMETEROL 250-50 MCG/ACT IN AEPB
250-50 MCG/ACT | Freq: Two times a day (BID) | RESPIRATORY_TRACT | 3 refills | Status: DC
Start: 2020-09-29 — End: 2020-10-03

## 2020-09-29 MED ORDER — IPRATROPIUM-ALBUTEROL 0.5-2.5 (3) MG/3ML IN SOLN
RESPIRATORY_TRACT | Status: AC
Start: 2020-09-29 — End: 2020-09-29

## 2020-09-29 MED ORDER — ALBUTEROL SULFATE HFA 108 (90 BASE) MCG/ACT IN AERS
108 (90 Base) MCG/ACT | RESPIRATORY_TRACT | 1 refills | Status: DC | PRN
Start: 2020-09-29 — End: 2020-12-26

## 2020-09-29 MED FILL — IPRATROPIUM-ALBUTEROL 0.5-2.5 (3) MG/3ML IN SOLN: 0.5-2.5 (3) MG/3ML | RESPIRATORY_TRACT | Qty: 3

## 2020-09-29 NOTE — ED Provider Notes (Signed)
ED  Provider Note  Admit Date/RoomTime: 09/29/2020  3:55 PM  ED Room: 14A/14A-14      History of Present Illness:  09/29/20, Time: 4:20 PM EDT  Chief Complaint   Patient presents with   ??? Asthma     was given duoneb x 2         Ray Rodgers is a 40 y.o. male presenting to the ED for asthma exacerbation. S/p duoneb x2 by EMS PTA.   Onset: gradual   Timing: progressive    Duration: minutes to hours   Associated symptoms: started gradually today this morning, has not had his inhalers for two or three days, chest tightness now improved, SOB now greatly improved, no arm jaw back pain, no f/c, +c, no congestion, no abd pain, n/v/d/c.   Severity: moderate    Exacerbated by: no meds   Relieved by: duoneb PTA         Review of Systems:   A complete review of systems was performed and pertinent positives and negatives are stated within HPI, all other systems reviewed and are negative.        --------------------------------------------- PATIENT HISTORY--------------------------------------------  Past Medical History:  has a past medical history of Asthma.    Past Surgical History:  has no past surgical history on file.    Family History: family history is not on file.. Unless otherwise noted, family history is non contributory    Social History:  reports that he quit smoking about 2 years ago. His smoking use included cigarettes. He has never used smokeless tobacco. He reports that he does not drink alcohol and does not use drugs.    The patient???s home medications have been reviewed.    Allergies: Patient has no known allergies.    I have reviewed the past medical history, past surgical history, social history, and family history    ---------------------------------------------------PHYSICAL EXAM--------------------------------------    Constitutional/General: Alert and oriented x3  Head: Normocephalic and atraumatic  Eyes: PERRL, EOMI, sclera non icteric  ENT: Oropharynx clear, handling secretions, no trismus  Neck:  Supple, full ROM, no stridor, no meningismus  Respiratory: mild diffuse wheeze EE, no rales or ronchi   Cardiovascular: RRR, no R/G/M, 2+ peripheral pulses  Chest: No chest wall tenderness, equal chest rise  Gastrointestinal:  sntnd  Musculoskeletal: Extremities warm and well perfused, moving all extremities  Skin: skin warm and dry. No rashes.   Neurologic: No focal deficits, strength and sensation grossly intact   Psychiatric: Normal Affect, behavior normal           -------------------------------------------------- RESULTS -------------------------------------------------  I have personally reviewed all laboratory and imaging results for this patient. Results are listed below.     LABS: (Lab results interpreted by me)  Results for orders placed or performed during the hospital encounter of 09/29/20   COVID-19, Rapid    Specimen: Nasopharyngeal Swab   Result Value Ref Range    SARS-CoV-2, NAAT Not Detected Not Detected   RAPID INFLUENZA A/B ANTIGENS    Specimen: Nasopharyngeal   Result Value Ref Range    Influenza A by PCR Not Detected Not Detected    Influenza B by PCR Not Detected Not Detected   CBC with Auto Differential   Result Value Ref Range    WBC 6.4 4.5 - 11.5 E9/L    RBC 5.34 3.80 - 5.80 E12/L    Hemoglobin 15.1 12.5 - 16.5 g/dL    Hematocrit 10.6 26.9 - 54.0 %    MCV  87.6 80.0 - 99.9 fL    MCH 28.3 26.0 - 35.0 pg    MCHC 32.3 32.0 - 34.5 %    RDW 14.0 11.5 - 15.0 fL    Platelets 219 130 - 450 E9/L    MPV 10.1 7.0 - 12.0 fL    Neutrophils % 56.9 43.0 - 80.0 %    Immature Granulocytes % 0.2 0.0 - 5.0 %    Lymphocytes % 28.1 20.0 - 42.0 %    Monocytes % 7.5 2.0 - 12.0 %    Eosinophils % 5.7 0.0 - 6.0 %    Basophils % 1.6 0.0 - 2.0 %    Neutrophils Absolute 3.67 1.80 - 7.30 E9/L    Immature Granulocytes # 0.01 E9/L    Lymphocytes Absolute 1.81 1.50 - 4.00 E9/L    Monocytes Absolute 0.48 0.10 - 0.95 E9/L    Eosinophils Absolute 0.37 0.05 - 0.50 E9/L    Basophils Absolute 0.10 0.00 - 0.20 E9/L   Basic  Metabolic Panel w/ Reflex to MG   Result Value Ref Range    Sodium 144 132 - 146 mmol/L    Potassium reflex Magnesium 3.9 3.5 - 5.0 mmol/L    Chloride 109 (H) 98 - 107 mmol/L    CO2 26 22 - 29 mmol/L    Anion Gap 9 7 - 16 mmol/L    Glucose 76 74 - 99 mg/dL    BUN 16 6 - 20 mg/dL    CREATININE 1.1 0.7 - 1.2 mg/dL    GFR Non-African American >60 >=60 mL/min/1.73    GFR African American >60     Calcium 8.4 (L) 8.6 - 10.2 mg/dL   Troponin   Result Value Ref Range    Troponin, High Sensitivity 9 0 - 11 ng/L   Brain Natriuretic Peptide   Result Value Ref Range    Pro-BNP 188 (H) 0 - 125 pg/mL   EKG 12 Lead   Result Value Ref Range    Ventricular Rate 71 BPM    Atrial Rate 71 BPM    P-R Interval 144 ms    QRS Duration 88 ms    Q-T Interval 414 ms    QTc Calculation (Bazett) 449 ms    P Axis 74 degrees    R Axis 55 degrees    T Axis 66 degrees   ,       RADIOLOGY:  Imaging interpreted by Radiologist unless otherwise specified  XR CHEST (2 VW)   Final Result   Obstructive airways disease.           ------------------------- NURSING NOTES AND VITALS REVIEWED ---------------------------  The nursing notes within the ED encounter and vital signs as below have been reviewed by myself  BP 97/61    Pulse 90    Temp 98.1 ??F (36.7 ??C) (Oral)    Resp 18    SpO2 94%      The patient???s available past medical records and past encounters were reviewed.        ------------------------------ ED COURSE/MEDICAL DECISION MAKING----------------------  Medications   ipratropium-albuterol (DUONEB) nebulizer solution 1 ampule (has no administration in time range)       I, Dr. Marlyne Beards, am the primary provider of record    Medical Decision Making:   Asthma exacerbation, mild 2/2 medication noncompliance. Vitals stable. No respiratory distress. Respirations without increased WOB.    O2 stable on RA.         ED Counseling:  This emergency provider has spoken with the patient and any family present to discuss clinical status, results, plan of care,  diagnosis and prognosis as able to be determined at this time. Any questions were answered and patient and/or family/POA are agreeable with the plan.       --------------------------------- IMPRESSION AND DISPOSITION ---------------------------------    IMPRESSION  1. Exacerbation of intermittent asthma, unspecified asthma severity        DISPOSITION  Disposition: Discharge to home  Patient condition is good      This report was transcribed using voice recognition software. Every effort was made to ensure accuracy; however, transcription errors may be present.         Beryle Flock, MD  09/29/20 1730

## 2020-09-29 NOTE — ED Notes (Signed)
Department of Emergency Medicine  FIRST PROVIDER TRIAGE NOTE             [facility]           09/29/20  3:10 PM EDT    Date of Encounter: 09/29/20   MRN: [id number]      HPI: Brittin Janik is a 40 y.o. male who presents to the ED for Asthma (was given duoneb x 2)     Patient is a 40 year old that is presenting with an asthma attack.  Patient states he ran out of his inhaler.  Patient states that started yesterday at gradually getting worse today worse with exertion.  Patient states he is here for breathing treatment.  Patient has no other concerns    ROS: Negative for cp, abd pain, back pain, fever, vomiting or diarrhea.    PE: Gen Appearance/Constitutional: alert  HEENT: NC/NT. PERRLA,  Airway patent.  Neck: supple     Initial Plan of Care: All treatment areas with department are currently occupied. Plan to order/Initiate the following while awaiting opening in ED: labs and imaging studies.  Initiate Treatment-Testing, Proceed toTreatment Area When Bed Available for ED Attending/MLP to Continue Care    Electronically signed by Janeth Rase, PA-C   DD: 09/29/20         Janeth Rase, PA-C  09/29/20 1510

## 2020-09-29 NOTE — ED Provider Notes (Signed)
HPI:  09/29/20,   Time: 11:23 PM EDT       Ray Rodgers is a 40 y.o. male presenting to the ED for sob/asthma, beginning 1 day ago.  The complaint has been persistent, moderate in severity, and worsened by nothing.  Bib ems, asthma flare, seen at main for same earlier.  Mild improvement with nebs.  No fever/chills/sweats.  Pos wheezing.  No n/v/d/abd pain/ha/dizziness.  No rash/skin lesions    Review of Systems:   Pertinent positives and negatives are stated within HPI, all other systems reviewed and are negative.          --------------------------------------------- PAST HISTORY ---------------------------------------------  Past Medical History:  has a past medical history of Asthma.    Past Surgical History:  has no past surgical history on file.    Social History:  reports that he quit smoking about 2 years ago. His smoking use included cigarettes. He has never used smokeless tobacco. He reports that he does not drink alcohol and does not use drugs.    Family History: family history is not on file.     The patient???s home medications have been reviewed.    Allergies: Patient has no known allergies.        ---------------------------------------------------PHYSICAL EXAM--------------------------------------    Constitutional/General: Alert and oriented x3, well appearing, non toxic in NAD  Head: Normocephalic and atraumatic  Eyes: PERRL, EOMI, conjunctive normal, sclera non icteric  Mouth: Oropharynx clear, handling secretions, n  Neck: Supple, full ROM,   Respiratory: diffuse wheezing. Not in respiratory distress  Cardiovascular:  Regular rate. Regular rhythm. No murmurs, gallops, or rubs. 2+ distal pulses  Chest: No chest wall tenderness  GI:  Abdomen Soft, Non tender, Non distended.   Musculoskeletal: Moves all extremities x 4. Warm and well perfused,  Integument: skin warm and dry. No rashes.   Lymphatic: no lymphadenopathy noted  Neurologic: GCS 15, no focal deficits,   Psychiatric: Normal  Affect    -------------------------------------------------- RESULTS -------------------------------------------------  I have personally reviewed all laboratory and imaging results for this patient. Results are listed below.     LABS:  Results for orders placed or performed during the hospital encounter of 09/29/20   CBC with Auto Differential   Result Value Ref Range    WBC 5.1 4.5 - 11.5 E9/L    RBC 4.80 3.80 - 5.80 E12/L    Hemoglobin 13.8 12.5 - 16.5 g/dL    Hematocrit 70.2 63.7 - 54.0 %    MCV 86.3 80.0 - 99.9 fL    MCH 28.8 26.0 - 35.0 pg    MCHC 33.3 32.0 - 34.5 %    RDW 13.8 11.5 - 15.0 fL    Platelets 206 130 - 450 E9/L    MPV 10.0 7.0 - 12.0 fL    Neutrophils % 52.7 43.0 - 80.0 %    Immature Granulocytes % 0.4 0.0 - 5.0 %    Lymphocytes % 28.0 20.0 - 42.0 %    Monocytes % 6.9 2.0 - 12.0 %    Eosinophils % 10.0 (H) 0.0 - 6.0 %    Basophils % 2.0 0.0 - 2.0 %    Neutrophils Absolute 2.69 1.80 - 7.30 E9/L    Immature Granulocytes # 0.02 E9/L    Lymphocytes Absolute 1.43 (L) 1.50 - 4.00 E9/L    Monocytes Absolute 0.35 0.10 - 0.95 E9/L    Eosinophils Absolute 0.51 (H) 0.05 - 0.50 E9/L    Basophils Absolute 0.10 0.00 - 0.20 E9/L  Basic Metabolic Panel w/ Reflex to MG   Result Value Ref Range    Sodium 141 132 - 146 mmol/L    Potassium reflex Magnesium 3.7 3.5 - 5.0 mmol/L    Chloride 109 (H) 98 - 107 mmol/L    CO2 22 22 - 29 mmol/L    Anion Gap 10 7 - 16 mmol/L    Glucose 172 (H) 74 - 99 mg/dL    BUN 19 6 - 20 mg/dL    CREATININE 1.1 0.7 - 1.2 mg/dL    GFR Non-African American >60 >=60 mL/min/1.73    GFR African American >60     Calcium 7.8 (L) 8.6 - 10.2 mg/dL   Blood Gas, Arterial   Result Value Ref Range    Date Analyzed [id number]     Time Analyzed 2350     Source: Blood Arterial     pH, Blood Gas 7.370 7.350 - 7.450    PCO2 44.3 35.0 - 45.0 mmHg    PO2 43.8 (L) 75.0 - 100.0 mmHg    HCO3 25.0 22.0 - 26.0 mmol/L    B.E. -0.5 -3.0 - 3.0 mmol/L    O2 Sat 81.0 (L) 92.0 - 98.5 %    O2Hb 79.0 (L) 94.0 - 97.0 %     COHb 2.2 (H) 0.0 - 1.5 %    MetHb 0.3 0.0 - 1.5 %    O2 Content 16.0 mL/dL    HHb 38.4 (H) 0.0 - 5.0 %    tHb (est) 14.4 11.5 - 16.5 g/dL    Mode RA     Comment Possible mixed gases     Date Of Collection      Time Collected      Pt Temp 37.0 C    Operator ID [id number]     Lab [id number]     Critical(s) Notified . No Critical Values        RADIOLOGY:  Interpreted by Radiologist.  XR CHEST PORTABLE   Final Result   No acute findings.             EKG:  This EKG is signed and interpreted by the EP.    Time:   Rate:   Rhythm:   Interpretation:   Comparison:       ------------------------- NURSING NOTES AND VITALS REVIEWED ---------------------------   The nursing notes within the ED encounter and vital signs as below have been reviewed by myself.  BP 99/60    Pulse 76    Temp 97.8 ??F (36.6 ??C) (Oral)    Resp 20    Ht 6' (1.829 m)    Wt 187 lb (84.8 kg)    SpO2 98%    BMI 25.36 kg/m??   Oxygen Saturation Interpretation: Normal    The patient???s available past medical records and past encounters were reviewed.        ------------------------------ ED COURSE/MEDICAL DECISION MAKING----------------------  Medications   ipratropium-albuterol (DUONEB) nebulizer solution 3 ampule (3 ampules Inhalation Given 09/29/20 2327)   predniSONE (DELTASONE) tablet 60 mg (60 mg Oral Given 09/29/20 2336)   magnesium sulfate 2000 mg in 50 mL IVPB premix (0 mg IntraVENous Stopped 09/30/20 0155)         ED COURSE:       Medical Decision Making:    Chart reviewed, covid neg from earlier, so no repeat.  abg noted, o2 given, admit for further care      This patient's ED course included: a personal history and physicial examination  This patient has remained hemodynamically stable during their ED course.      Re-Evaluations:             Re-evaluation.  Patient???s symptoms are improving    Re-examination  09/30/20   0030 aM EDT          Vital Signs:   Vitals:    09/30/20 0100 09/30/20 0130 09/30/20 0200 09/30/20 0215   BP: 104/72 100/61 106/62 99/60   Pulse: 81  80 78 76   Resp:       Temp:       TempSrc:       SpO2: 92% 92% 95% 98%   Weight:       Height:                 Consultations:             hospitalist    Critical Care: 35 min        Counseling:   The emergency provider has spoken with the patient and discussed today???s results, in addition to providing specific details for the plan of care and counseling regarding the diagnosis and prognosis.  Questions are answered at this time and they are agreeable with the plan.       --------------------------------- IMPRESSION AND DISPOSITION ---------------------------------    IMPRESSION  1. Hypoxia    2. Mild intermittent asthma with exacerbation        DISPOSITION  Disposition: Admit to med/surg floor  Patient condition is stable    NOTE: This report was transcribed using voice recognition software. Every effort was made to ensure accuracy; however, inadvertent computerized transcription errors may be present        Eligha Bridegroom, MD  09/30/20 [phone]

## 2020-09-29 NOTE — ED Notes (Signed)
Pt reports feeling "much better" after breathing treatments. Pt currently resting comfortably with eyes closed. Respirations easy and even. Reports no further needs at this time.      Darleen Crocker, RN  09/29/20 [phone]

## 2020-09-29 NOTE — ED Notes (Signed)
ABG done per policy. Pt tolerated well. Respiratory made aware.      Darleen Crocker, RN  09/29/20 2350

## 2020-09-30 ENCOUNTER — Inpatient Hospital Stay
Admit: 2020-09-30 | Discharge: 2020-10-03 | Disposition: A | Discharge status: Home / Self Care | Payer: PRIVATE HEALTH INSURANCE | Admitting: Internal Medicine

## 2020-09-30 ENCOUNTER — Emergency Department: Admit: 2020-09-30 | Payer: PRIVATE HEALTH INSURANCE

## 2020-09-30 LAB — BLOOD GAS, ARTERIAL
B.E.: -0.5 mmol/L (ref <=3.0)
COHb: 2.2 % — ABNORMAL HIGH (ref 0.0–1.5)
Date Analyzed: 20220616
HCO3: 25 mmol/L (ref 22.0–26.0)
HHb: 18.5 % — ABNORMAL HIGH (ref 0.0–5.0)
Lab: 57886
MetHb: 0.3 % (ref 0.0–1.5)
O2 Content: 16 mL/dL
O2 Sat: 81 % — ABNORMAL LOW (ref 92.0–98.5)
O2Hb: 79 % — ABNORMAL LOW (ref 94.0–97.0)
Operator ID: 2485
PCO2: 44.3 mmHg (ref 35.0–45.0)
PO2: 43.8 mmHg — ABNORMAL LOW (ref 75.0–100.0)
Pt Temp: 37 C
Time Analyzed: 2350
pH, Blood Gas: 7.37 (ref 7.350–7.450)
tHb (est): 14.4 g/dL (ref 11.5–16.5)

## 2020-09-30 LAB — CBC WITH AUTO DIFFERENTIAL
Basophils %: 2 % (ref 0.0–2.0)
Basophils Absolute: 0.1 E9/L (ref 0.00–0.20)
Eosinophils %: 10 % — ABNORMAL HIGH (ref 0.0–6.0)
Eosinophils Absolute: 0.51 E9/L — ABNORMAL HIGH (ref 0.05–0.50)
Hematocrit: 41.4 % (ref 37.0–54.0)
Hemoglobin: 13.8 g/dL (ref 12.5–16.5)
Immature Granulocytes #: 0.02 E9/L
Immature Granulocytes %: 0.4 % (ref 0.0–5.0)
Lymphocytes %: 28 % (ref 20.0–42.0)
Lymphocytes Absolute: 1.43 E9/L — ABNORMAL LOW (ref 1.50–4.00)
MCH: 28.8 pg (ref 26.0–35.0)
MCHC: 33.3 % (ref 32.0–34.5)
MCV: 86.3 fL (ref 80.0–99.9)
MPV: 10 fL (ref 7.0–12.0)
Monocytes %: 6.9 % (ref 2.0–12.0)
Monocytes Absolute: 0.35 E9/L (ref 0.10–0.95)
Neutrophils %: 52.7 % (ref 43.0–80.0)
Neutrophils Absolute: 2.69 E9/L (ref 1.80–7.30)
Platelets: 206 E9/L (ref 130–450)
RBC: 4.8 E12/L (ref 3.80–5.80)
RDW: 13.8 fL (ref 11.5–15.0)
WBC: 5.1 E9/L (ref 4.5–11.5)

## 2020-09-30 LAB — EKG 12-LEAD
Atrial Rate: 71 {beats}/min
P Axis: 74 degrees
P-R Interval: 144 ms
Q-T Interval: 414 ms
QRS Duration: 88 ms
QTc Calculation (Bazett): 449 ms
R Axis: 55 degrees
T Axis: 66 degrees
Ventricular Rate: 71 {beats}/min

## 2020-09-30 LAB — BASIC METABOLIC PANEL W/ REFLEX TO MG FOR LOW K
Anion Gap: 10 mmol/L (ref 7–16)
BUN: 19 mg/dL (ref 6–20)
CO2: 22 mmol/L (ref 22–29)
Calcium: 7.8 mg/dL — ABNORMAL LOW (ref 8.6–10.2)
Chloride: 109 mmol/L — ABNORMAL HIGH (ref 98–107)
Creatinine: 1.1 mg/dL (ref 0.7–1.2)
GFR African American: >60
GFR Non-African American: >60 mL/min/{1.73_m2} (ref >=60)
Glucose: 172 mg/dL — ABNORMAL HIGH (ref 74–99)
Potassium reflex Magnesium: 3.7 mmol/L (ref 3.5–5.0)
Sodium: 141 mmol/L (ref 132–146)

## 2020-09-30 LAB — HEMOGLOBIN A1C: Hemoglobin A1C: 5.3 % (ref 4.0–5.6)

## 2020-09-30 LAB — URINE DRUG SCREEN
Amphetamine Screen, Urine: NOT DETECTED (ref <1000)
Barbiturate Screen, Ur: NOT DETECTED (ref <200)
Benzodiazepine Screen, Urine: NOT DETECTED (ref <200)
Cannabinoid Scrn, Ur: NOT DETECTED
Cocaine Metabolite Screen, Urine: POSITIVE — AB (ref <300)
FENTANYL SCREEN, URINE: NOT DETECTED (ref <1)
Methadone Screen, Urine: NOT DETECTED (ref <300)
Opiate Scrn, Ur: NOT DETECTED
Oxycodone Urine: NOT DETECTED (ref <100)
PCP Screen, Urine: NOT DETECTED (ref <25)

## 2020-09-30 MED ORDER — PREDNISONE 20 MG PO TABS
20 MG | Freq: Once | ORAL | Status: AC
Start: 2020-09-30 — End: 2020-09-29
  Administered 2020-09-30: 04:00:00 60 mg via ORAL

## 2020-09-30 MED ORDER — BUDESONIDE 0.5 MG/2ML IN SUSP
0.5 MG/2ML | Freq: Two times a day (BID) | RESPIRATORY_TRACT | Status: DC
Start: 2020-09-30 — End: 2020-09-30
  Administered 2020-09-30: 13:00:00 500 mg via RESPIRATORY_TRACT

## 2020-09-30 MED ORDER — IPRATROPIUM-ALBUTEROL 0.5-2.5 (3) MG/3ML IN SOLN
RESPIRATORY_TRACT | Status: DC
Start: 2020-09-30 — End: 2020-10-03
  Administered 2020-09-30 – 2020-10-03 (×19): 1 via RESPIRATORY_TRACT

## 2020-09-30 MED ORDER — ARFORMOTEROL TARTRATE 15 MCG/2ML IN NEBU
15 MCG/2ML | Freq: Two times a day (BID) | RESPIRATORY_TRACT | Status: DC
Start: 2020-09-30 — End: 2020-09-30
  Administered 2020-09-30: 13:00:00 15 ug via RESPIRATORY_TRACT

## 2020-09-30 MED ORDER — BUDESONIDE 0.5 MG/2ML IN SUSP
0.5 MG/2ML | Freq: Two times a day (BID) | RESPIRATORY_TRACT | Status: DC
Start: 2020-09-30 — End: 2020-10-03
  Administered 2020-10-01 – 2020-10-03 (×6): 1000 mg via RESPIRATORY_TRACT

## 2020-09-30 MED ORDER — METHYLPREDNISOLONE SODIUM SUCC 40 MG IJ SOLR
40 MG | Freq: Two times a day (BID) | INTRAMUSCULAR | Status: DC
Start: 2020-09-30 — End: 2020-10-01
  Administered 2020-09-30 – 2020-10-01 (×3): 40 mg via INTRAVENOUS

## 2020-09-30 MED ORDER — MONTELUKAST SODIUM 10 MG PO TABS
10 MG | Freq: Every evening | ORAL | Status: DC
Start: 2020-09-30 — End: 2020-10-03
  Administered 2020-10-01 – 2020-10-03 (×3): 10 mg via ORAL

## 2020-09-30 MED ORDER — IPRATROPIUM-ALBUTEROL 0.5-2.5 (3) MG/3ML IN SOLN
Freq: Once | RESPIRATORY_TRACT | Status: AC
Start: 2020-09-30 — End: 2020-09-29
  Administered 2020-09-30: 03:00:00 3 via RESPIRATORY_TRACT

## 2020-09-30 MED ORDER — BUDESONIDE-FORMOTEROL FUMARATE 160-4.5 MCG/ACT IN AERO
Freq: Two times a day (BID) | RESPIRATORY_TRACT | Status: DC
Start: 2020-09-30 — End: 2020-09-30

## 2020-09-30 MED ORDER — IPRATROPIUM-ALBUTEROL 0.5-2.5 (3) MG/3ML IN SOLN
RESPIRATORY_TRACT | Status: DC | PRN
Start: 2020-09-30 — End: 2020-09-30

## 2020-09-30 MED ORDER — MAGNESIUM SULFATE 2000 MG/50 ML IVPB PREMIX
2 GM/50ML | Freq: Once | INTRAVENOUS | Status: AC
Start: 2020-09-30 — End: 2020-09-30
  Administered 2020-09-30: 04:00:00 2000 mg via INTRAVENOUS

## 2020-09-30 MED ORDER — ARFORMOTEROL TARTRATE 15 MCG/2ML IN NEBU
15 MCG/2ML | Freq: Two times a day (BID) | RESPIRATORY_TRACT | Status: DC
Start: 2020-09-30 — End: 2020-10-03
  Administered 2020-10-01 – 2020-10-03 (×6): 15 ug via RESPIRATORY_TRACT

## 2020-09-30 MED FILL — PREDNISONE 20 MG PO TABS: 20 mg | ORAL | Qty: 3

## 2020-09-30 MED FILL — BUDESONIDE 0.5 MG/2ML IN SUSP: 0.5 MG/2ML | RESPIRATORY_TRACT | Qty: 2

## 2020-09-30 MED FILL — MONTELUKAST SODIUM 10 MG PO TABS: 10 mg | ORAL | Qty: 1

## 2020-09-30 MED FILL — METHYLPREDNISOLONE SODIUM SUCC 40 MG IJ SOLR: 40 mg | INTRAMUSCULAR | Qty: 40

## 2020-09-30 MED FILL — BROVANA 15 MCG/2ML IN NEBU: 15 MCG/2ML | RESPIRATORY_TRACT | Qty: 2

## 2020-09-30 MED FILL — BUDESONIDE 0.5 MG/2ML IN SUSP: 0.5 MG/2ML | RESPIRATORY_TRACT | Qty: 4

## 2020-09-30 MED FILL — IPRATROPIUM-ALBUTEROL 0.5-2.5 (3) MG/3ML IN SOLN: 0.5-2.5 (3) MG/3ML | RESPIRATORY_TRACT | Qty: 3

## 2020-09-30 MED FILL — MAGNESIUM SULFATE 2 GM/50ML IV SOLN: 2 GM/50ML | INTRAVENOUS | Qty: 50

## 2020-09-30 NOTE — Plan of Care (Signed)
77/632 39-year-old male presented to [facility] ED planes of increased shortness of breath/asthma exacerbation.  He received duo nebs x2 per EMS prior to arrival.  He was given DuoNeb in ED course.  Patient mains on room air without increased work of breathing.  Decision to discharge patient home at that time.  Presented to [facility] ED with complaints as recent visit.  Noted to have ran out of Advair/albuterol over the last week per chart review.  Does not routinely follow-up with pulmonology or PCP.      Patient sitting up in bed in no acute distress.  States he feels much better breathing has improved significantly.  Await pulmonology input.      1.  Acute asthma exacerbation??? has been stretching out Advair only using once daily.  Recently ran out of Advair/albuterol over the last week.  Was seen at [facility] evening prior to presentation.  Received duo nebs x2 and discharged home.  Presented back to ED later that night.  Received magnesium sulfate 2g IV/Duoneb/prednisone in ED course. Eosinophilia 10.0 IgE 1348.  CXR no acute findings.  Hx smoking cessation approximately 2 years ago per chart review.  Continue DuoNebs/Solu-Medrol/Brovana.  Pulmonology input appreciated

## 2020-09-30 NOTE — Care Coordination-Inpatient (Signed)
Pt from home independently; no PCP; preservice # on AVS. Has been non-compliant with physician follow up in past (PCP and Pulmonary) can't remember who his pulmonologist was. Unable to fill RX d/tlackof PCP. Advised to F/U with PCP ASAP after discharge for this reason as he has readmissions. Pt understands and agrees. Will follow.Deb Marcille Barman,RN,CM.
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
## 2020-09-30 NOTE — Plan of Care (Signed)
Problem: Pain  Goal: Verbalizes/displays adequate comfort level or baseline comfort level  Outcome: Progressing  Flowsheets (Taken 09/30/2020 0730)  Verbalizes/displays adequate comfort level or baseline comfort level: Encourage patient to monitor pain and request assistance

## 2020-09-30 NOTE — ED Notes (Signed)
[facility] verified they received SBAR.      Darleen Crocker, RN  09/30/20 [phone]

## 2020-09-30 NOTE — H&P (Signed)
[facility] History and Physical        Chief Complaint:  Tight, wheezy  History of Present Illness   The patient is a 40 y.o. male    - doesn't fu PCP regulary, seen in urgent care in past  He hasn't fu [facility]  In some time    He was stretching out advair- using is once daily- ran out of advair and albuterol in past week.  He had been using puffer more frequently bc of hot humid weather.  No URI, sorethroat, No fever/chills/sweats.   Feels quited tight/squeezing ++ wheezing.  Deep breathing causing dry coughin.    No n/v/d/abd pain/ha/dizziness.   No rash/skin lesions  He has never been on singulair, or anti Ig E treatments or allergy shots.    Per ER notes, he has recently called ems- few times for , asthma flare, seen at main ER for same earlier.  Mild improvement with nebs.  -but symptoms recur.    - hx taken from the patient  REVIEW OF SYSTEMS:  no fevers, chills, cp, sob, n/v, ha, vision/hearing changes, wt changes, hot/cold flashes, other open skin lesions, diarrhea, constipation, dysuria/hematuria unless noted in HPI. Complete ROS performed with the patient and is otherwise negative.    Past Medical History:      Diagnosis Date   ??? Asthma        Past Surgical History:    History reviewed. No pertinent surgical history.    Home Medications:  Prior to Admission medications    Medication Sig Start Date End Date Taking? Authorizing Provider   albuterol sulfate HFA (PROVENTIL HFA) 108 (90 Base) MCG/ACT inhaler Inhale 2 puffs into the lungs every 4 hours as needed for Wheezing 09/29/20 09/29/21  Beryle Flock, MD   fluticasone-salmeterol (ADVAIR DISKUS) 250-50 MCG/ACT AEPB diskus inhaler Inhale 1 puff into the lungs every 12 hours 09/29/20   Beryle Flock, MD       Allergies:  Patient has no known allergies.    Social History:   TOBACCO:   reports that he quit smoking about 2 years ago. His smoking use included cigarettes. He has never used  smokeless tobacco.  ETOH:   reports no history of alcohol use.    Family History:   History reviewed. No pertinent family history.   Or deferred/otherwise considered non contributory to current admission  PHYSICAL EXAM:    VS: BP 114/71    Pulse 83    Temp 97.8 ??F (36.6 ??C) (Oral)    Resp 20    Ht 6' (1.829 m)    Wt 187 lb (84.8 kg)    SpO2 95%    BMI 25.36 kg/m??     General Appearance:    +mild acute distress.- with coughing with breathing   Psych:  HEENT:    A.O. As per HPI details  NC/AT, PERRL, no pallor no icterus, lips/ext mucous membrane grossly N    Neck:   Supple, trachea midline, no obvious JVD   Resp:     ++wheezes,+ rhonchi   Chest wall:    No tenderness or deformity   Heart:    Tachy Regular rate and rhythm, S1 and S2 normal, no rub or gallop.   Abdomen:     Soft, non-tender, bowel sounds active    no suspicious obvious masses/organomegaly   Genitalia & Rectal:    Deferred.   Extremities x4:   Extremities normal, atraumatic, no cyanosis, no clubbing  Musculoskeletal:      NO active synovitis or swollen b/l wrists, 2-5 MCPs examined   Skin:   Skin color, texture, turgor fairly normal, no ACUTE rashes or lesions in lower legs and arms examined   Lymph nodes:   Cervical nodes grossly normal   Neurologic:  .Grossly symmetric  strength in UEs and LEs with symmetric grossly intact to light touch sensation     LABS:  CBC:   Lab Results   Component Value Date    WBC 5.1 09/29/2020    RBC 4.80 09/29/2020    HGB 13.8 09/29/2020    HCT 41.4 09/29/2020    PLT 206 09/29/2020    MCV 86.3 09/29/2020     BMP:    Lab Results   Component Value Date    NA 141 09/29/2020    K 3.7 09/29/2020    CL 109 09/29/2020    CO2 22 09/29/2020    BUN 19 09/29/2020    CREATININE 1.1 09/29/2020    GLUCOSE 172 09/29/2020    CALCIUM 7.8 09/29/2020     Hepatic Function Panel:    Lab Results   Component Value Date    ALKPHOS 93 08/20/2020    AST 25 08/20/2020    ALT 17 08/20/2020    PROT 5.9 08/20/2020    LABALBU 3.8 08/20/2020    BILITOT  0.2 08/20/2020     Magnesium:    Lab Results   Component Value Date    MG 2.2 08/20/2020       PT/INR:  No results found for: PROTIME, INR  U/A:   Lab Results   Component Value Date    LEUKOCYTESUR Negative 11/06/2018    PHUR 7.0 11/06/2018    SPECGRAV 1.020 11/06/2018    BLOODU Negative 11/06/2018    GLUCOSEU Negative 11/06/2018     ABG:  No results found for: PHART, PCO2ART, PO2ART, O2SATART, HCO3ART, BEART  TSH:    Lab Results   Component Value Date    TSH 1.800 02/26/2018     Cardiac Enzymes:   Lab Results   Component Value Date    TROPONINI <0.01 11/05/2018    TROPONINI <0.01 10/31/2018       Radiology: XR CHEST (2 VW)    Result Date: 09/29/2020  EXAMINATION: TWO XRAY VIEWS OF THE CHEST 09/29/2020 2:51 pm COMPARISON: None. HISTORY: ORDERING SYSTEM PROVIDED HISTORY: asthma TECHNOLOGIST PROVIDED HISTORY: Reason for exam:->asthma What reading provider will be dictating this exam?->CRC FINDINGS: Hyperaeration compatible with obstructive airways disease.  Normal heart and pulmonary vascularity.  Neither costophrenic angle is blunted.     Obstructive airways disease.     XR CHEST PORTABLE    Result Date: 09/30/2020  EXAMINATION: ONE XRAY VIEW OF THE CHEST 09/30/2020 12:44 am COMPARISON: September 29, 2020. HISTORY: ORDERING SYSTEM PROVIDED HISTORY: asthma flare TECHNOLOGIST PROVIDED HISTORY: Reason for exam:->asthma flare FINDINGS: The cardiomediastinal contours and pulmonary vasculature are within normal limits.  No effusion, focal consolidation or pneumothorax is seen. No acute osseous abnormalities are identified.     No acute findings.       EKG:  Normal sinus rhythm  Normal ECG  When compared with ECG of 20-Aug-2020 02:11,  No significant change was found   Assessment & Plan   ACTIVE hospital problems being addressed/reassessed for this admission:  Principal Problem:    Asthma exacerbation, mild  Active Problems:    IgE-mediated allergic disorder    Sugar blood level increased    Former smoker  Resolved  Problems:    * No  resolved hospital problems. *    Code status/DVT prophylaxis and PLAN --see orders   Note extensive time spent coordinating care between ER docs, ER and floor nurses, and transitioning care over to day providers  Plan of care/ clinical impressions/communication specifics detailed below:  40 y.o. male    - doesn't fu PCP regulary, seen in urgent care in past  He hasn't fu [facility]  In some time  Per ER notes, he has recently called ems- few times for , asthma flare, seen at main ER for same earlier.  Mild improvement with nebs.  -but symptoms recur.      He was stretching out advair- using is once daily- ran out of advair and albuterol in past week.  He had been using puffer more frequently bc of hot humid weather.  No URI, sorethroat, No fever/chills/sweats.   Feels quited tight/squeezing ++ wheezing.  Deep breathing causing dry coughin.    No n/v/d/abd pain/ha/dizziness.   No rash/skin lesions  He has never been on singulair, or anti Ig E treatments or allergy shots.    Will start singulair  Check IgE, hypersensitivity panel, urine drug screen  He quit smoking 2 years ago   Steroids, duonebs, pulmonary and social worker consult    Inc BS, check aic.- pre DM vs DM2      Ciro Backer MD   Night Officer, overnight admitting doctor at [facility] call day time doctor   for questions after 7:30am    Covering for [facility]  If Qs please call [phone]  Electronically signed by Ricki Rodriguez, MD on 09/30/2020 at 3:45 AM

## 2020-09-30 NOTE — Progress Notes (Signed)
Pulse ox was 95% on room air at rest.  Ambulated patient in the hallway and back oxygen saturation to 91% on room air and recovery pulse ox was 96% on room air.  Patient educated on the use of incentive spirometer and patient stated his home medications for asthma did not have a refill and patient currently does not have a PCP

## 2020-10-01 MED ORDER — METHYLPREDNISOLONE SODIUM SUCC 40 MG IJ SOLR
40 MG | Freq: Every day | INTRAMUSCULAR | Status: DC
Start: 2020-10-01 — End: 2020-10-03
  Administered 2020-10-02 – 2020-10-03 (×2): 40 mg via INTRAVENOUS

## 2020-10-01 MED FILL — MONTELUKAST SODIUM 10 MG PO TABS: 10 mg | ORAL | Qty: 1

## 2020-10-01 MED FILL — METHYLPREDNISOLONE SODIUM SUCC 40 MG IJ SOLR: 40 mg | INTRAMUSCULAR | Qty: 40

## 2020-10-01 NOTE — Progress Notes (Signed)
[facility]  [facility]  [address], [address]  [place], [place] [address]      Pulmonary Progress Note      SUBJECTIVE:  On RA, sitting up in bed, claims doing ok with breathing with much less cough, ambulating some in room and tolerating.    OBJECTIVE    Medications    Continuous Infusions:    Scheduled Meds:  ??? [START ON 10/02/2020] methylPREDNISolone  40 mg IntraVENous Daily   ??? montelukast  10 mg Oral Nightly   ??? budesonide  1 mg Nebulization BID    And   ??? Arformoterol Tartrate  15 mcg Nebulization BID   ??? ipratropium-albuterol  1 ampule Inhalation Q4H       PRN Meds:    Physical    VITALS:  BP 120/60    Pulse 80    Temp 98.4 ??F (36.9 ??C) (Oral)    Resp 16    Ht 6' (1.829 m)    Wt 157 lb (71.2 kg)    SpO2 96%    BMI 21.29 kg/m??     24HR INTAKE/OUTPUT:      Intake/Output Summary (Last 24 hours) at 10/01/2020 1547  Last data filed at 10/01/2020 1200  Gross per 24 hour   Intake 436 ml   Output ???   Net 436 ml       24HR PULSE OXIMETRY RANGE:    SpO2  Avg: 93.5 %  Min: 90 %  Max: 96 %    General appearance: alert, appears stated age and cooperative  Lungs: rhonchi bibasilar  Heart: regular rate and rhythm, S1, S2 normal, no murmur, click, rub or gallop  Abdomen: soft, non-tender; bowel sounds normal; no masses,  no organomegaly  Extremities: extremities normal, atraumatic, no cyanosis or edema  Neurologic: Mental status: Alert, oriented, thought content appropriate    Data    CBC:   Recent Labs     09/29/20  1621 09/29/20  2335   WBC 6.4 5.1   HGB 15.1 13.8   HCT 46.8 41.4   MCV 87.6 86.3   PLT 219 206       BMP:  Recent Labs     09/29/20  1621 09/29/20  2335   NA 144 141   K 3.9 3.7   CL 109* 109*   CO2 26 22   BUN 16 19   CREATININE 1.1 1.1    ALB:3,BILIDIR:3,BILITOT:3,ALKPHOS:3)@    PT/INR: No results for input(s): PROTIME, INR in the last 72 hours.    ABG:   Recent Labs     09/29/20  2350   PH 7.370   PO2 43.8*   PCO2 44.3   HCO3 25.0   BE -0.5   O2SAT 81.0*    METHB 0.3   O2HB 79.0*   COHB 2.2*   O2CON 16.0   HHB 18.5*   THB 14.4             Radiology/Other tests reviewed: none    Assessment:     Principal Problem:    Asthma exacerbation, mild  Active Problems:    IgE-mediated allergic disorder    Sugar blood level increased    Former smoker  Resolved Problems:    * No resolved hospital problems. *      Plan:       1. Cont with steroid taper as tolerated, may start po in another day  2. Cont with nebs, can continue with usual  medications as out-pt  3. OOB to chair, ambulate as tolerated      Time at the bedside, reviewing labs and radiographs, reviewing notes and consultations, discussing with staff and family was more than 35 minutes.      Thanks for letting us see this patient in consultation.  Please contact us with any questions. Office [phone] or after hours through [employer], x [phone].

## 2020-10-01 NOTE — Plan of Care (Signed)
Problem: Discharge Planning  Goal: Discharge to home or other facility with appropriate resources  10/01/2020 0552 by Dolores Frame, RN  Outcome: Progressing  Flowsheets (Taken 09/30/2020 2014)  Discharge to home or other facility with appropriate resources: Identify barriers to discharge with patient and caregiver  09/30/2020 1911 by Lorrin Mais, RN  Outcome: Progressing  Flowsheets (Taken 09/30/2020 1027)  Discharge to home or other facility with appropriate resources: Identify barriers to discharge with patient and caregiver     Problem: Pain  Goal: Verbalizes/displays adequate comfort level or baseline comfort level  10/01/2020 0552 by Dolores Frame, RN  Outcome: Progressing  09/30/2020 1911 by Lorrin Mais, RN  Outcome: Progressing  Flowsheets (Taken 09/30/2020 0730)  Verbalizes/displays adequate comfort level or baseline comfort level: Encourage patient to monitor pain and request assistance

## 2020-10-01 NOTE — Progress Notes (Signed)
[facility] Hospitalist   Progress Note    Admitting Date and Time: 09/29/2020 11:05 PM  Admit Dx: Hypoxia [R09.02]  Mild intermittent asthma with exacerbation [J45.21]  Asthma exacerbation, mild [J45.901]    Subjective:    Pt feels better today. States breathing improving. Encouraged to ambulate and use IS. Denies an new concerns     Per RN: No new concerns noted.     ROS: denies fever, chills, cp,n/v, HA unless stated above.    ??? methylPREDNISolone  40 mg IntraVENous Q12H   ??? montelukast  10 mg Oral Nightly   ??? budesonide  1 mg Nebulization BID    And   ??? Arformoterol Tartrate  15 mcg Nebulization BID   ??? ipratropium-albuterol  1 ampule Inhalation Q4H           Objective:    BP 132/74    Pulse 85    Temp 98.9 ??F (37.2 ??C) (Oral)    Resp 22    Ht 6' (1.829 m)    Wt 157 lb (71.2 kg)    SpO2 90%    BMI 21.29 kg/m??   General Appearance: alert and oriented to person, place and time and in no acute distress  Skin: warm and dry  Head: normocephalic and atraumatic  Eyes: pupils equal, round, and reactive to light, extraocular eye movements intact, conjunctivae normal  Neck: neck supple and non tender without mass   Pulmonary/Chest: Exp Wheeze  to auscultation bilaterally- no  rales or rhonchi, normal air movement, no respiratory distress  Cardiovascular: normal rate, normal S1 and S2 and no carotid bruits  Abdomen: soft, non-tender, non-distended, normal bowel sounds,  Extremities: no cyanosis, no clubbing and no edema  Neurologic: no cranial nerve deficit and speech normal      Recent Labs     09/29/20  1621 09/29/20  2335   NA 144 141   K 3.9 3.7   CL 109* 109*   CO2 26 22   BUN 16 19   CREATININE 1.1 1.1   GLUCOSE 76 172*   CALCIUM 8.4* 7.8*       No results for input(s): ALKPHOS, PROT, LABALBU, BILITOT, AST, ALT in the last 72 hours.    Recent Labs     09/29/20  1621 09/29/20  2335   WBC 6.4 5.1   RBC 5.34 4.80   HGB 15.1 13.8   HCT 46.8 41.4   MCV 87.6 86.3   MCH 28.3 28.8   MCHC 32.3 33.3   RDW 14.0  13.8   PLT 219 206   MPV 10.1 10.0            Radiology:   XR CHEST PORTABLE   Final Result   No acute findings.             Assessment:  Principal Problem:    Asthma exacerbation, mild  Active Problems:    IgE-mediated allergic disorder    Sugar blood level increased    Former smoker  Resolved Problems:    * No resolved hospital problems. *      Plan:  1.  Acute asthma exacerbation??? has been stretching out Advair only using once daily.  Recently ran out of Advair/albuterol over the last week.  Was seen at [facility] evening prior to presentation.  Received duo nebs x2 and discharged home.  Presented back to ED later that night.  Received magnesium sulfate 2g IV/Duoneb/prednisone in ED course. Eosinophilia 10.0 IgE 1348.  CXR  no acute findings.  Hx smoking cessation approximately 2 years ago per chart review.  Continue DuoNebs/Solu-Medrol/Brovana.  Pulmonology input appreciated  2. Cocaine Use- UDS+ Cocaine. Counsel Cessation.   3. Medical Treatment Plan NonCompliance- Educate pt on importance of medication adherence, Follow up, and Cocaine Cessation.     NOTE: This report was transcribed using voice recognition software. Every effort was made to ensure accuracy; however, inadvertent computerized transcription errors may be present.     Electronically signed by Wille Celeste. Omelia Blackwater, APRN - CNP on 10/01/2020 at 7:46 AM  HOSPITALIST ATTENDING PHYSICIAN NOTE 10/01/2020 1954PM:    Details of the evaluation - subjective assessment (including medication profile, past medical, family and social history when applicable), examination, review of lab and test data, diagnostic impressions and medical decision making - performed by Wille Celeste. Omelia Blackwater, APRN - CNP, were discussed with me on the date of service and I agree with clinical information herein unless otherwise noted.    The patient has been evaluated by me personally earlier today.  Pt reports no fevers, chills,n/v.     Exam: heart reg at rate of 84,lungs pos wheeze with decreased  breathing motion and cough with deep inspiration, abd pos bs soft nt, ext neg for le edema    I agree with the assessment and plan of Wille Celeste. Omelia Blackwater, APRN - CNP    Discussed use and importance of IS     Asthma exacerbation/status asthmaticus  atelectasis  Hyperglycemia likely due to stress/steroids  Cocaine use        Electronically signed by Delsa Bern, D.O.  Hospitalist  [facility] at [facility]

## 2020-10-01 NOTE — Plan of Care (Signed)
Problem: Discharge Planning  Goal: Discharge to home or other facility with appropriate resources  Outcome: Progressing

## 2020-10-02 MED FILL — METHYLPREDNISOLONE SODIUM SUCC 40 MG IJ SOLR: 40 mg | INTRAMUSCULAR | Qty: 40

## 2020-10-02 NOTE — Progress Notes (Signed)
[facility] Hospitalist   Progress Note    Admitting Date and Time: 09/29/2020 11:05 PM  Admit Dx: Hypoxia [R09.02]  Mild intermittent asthma with exacerbation [J45.21]  Asthma exacerbation, mild [J45.901]    Subjective:    Pt lying in bed in no acute distress.  Patient states he feels much better this morning.  Encouraged to get up out of bed and walk around room and sit in chair throughout the day.  Patient states he slept well.  Breathing has improved.    Per RN: No new concerns noted.    ROS: denies fever, chills, cp,n/v, HA unless stated above.    ??? methylPREDNISolone  40 mg IntraVENous Daily   ??? montelukast  10 mg Oral Nightly   ??? budesonide  1 mg Nebulization BID    And   ??? Arformoterol Tartrate  15 mcg Nebulization BID   ??? ipratropium-albuterol  1 ampule Inhalation Q4H           Objective:    BP 120/71    Pulse 80    Temp 98.2 ??F (36.8 ??C) (Oral)    Resp 16    Ht 6' (1.829 m)    Wt 158 lb 4 oz (71.8 kg)    SpO2 95%    BMI 21.46 kg/m??   General Appearance: alert and oriented to person, place and time and in no acute distress  Skin: warm and dry  Head: normocephalic and atraumatic  Eyes: pupils equal, round, and reactive to light, extraocular eye movements intact, conjunctivae normal  Neck: neck supple and non tender without mass   Pulmonary/Chest: Exp Wheeze  to auscultation bilaterally- no  rales or rhonchi, normal air movement, no respiratory distress  Cardiovascular: normal rate, normal S1 and S2 and no carotid bruits  Abdomen: soft, non-tender, non-distended, normal bowel sounds,  Extremities: no cyanosis, no clubbing and no edema  Neurologic: no cranial nerve deficit and speech normal      Recent Labs     09/29/20  1621 09/29/20  2335   NA 144 141   K 3.9 3.7   CL 109* 109*   CO2 26 22   BUN 16 19   CREATININE 1.1 1.1   GLUCOSE 76 172*   CALCIUM 8.4* 7.8*       No results for input(s): ALKPHOS, PROT, LABALBU, BILITOT, AST, ALT in the last 72 hours.    Recent Labs     09/29/20  1621  09/29/20  2335   WBC 6.4 5.1   RBC 5.34 4.80   HGB 15.1 13.8   HCT 46.8 41.4   MCV 87.6 86.3   MCH 28.3 28.8   MCHC 32.3 33.3   RDW 14.0 13.8   PLT 219 206   MPV 10.1 10.0            Radiology:   XR CHEST PORTABLE   Final Result   No acute findings.             Assessment:  Principal Problem:    Asthma exacerbation, mild  Active Problems:    IgE-mediated allergic disorder    Sugar blood level increased    Asthma with status asthmaticus    Atelectasis    Cocaine use    Former smoker  Resolved Problems:    * No resolved hospital problems. *      Plan:  1.  Acute asthma exacerbation??? has been stretching out Advair only using once daily.  Recently ran out of  Advair/albuterol over the last week.  Was seen at [facility] evening prior to presentation.  Received duo nebs x2 and discharged home.  Presented back to ED later that night.  Received magnesium sulfate 2g IV/Duoneb/prednisone in ED course. Eosinophilia 10.0 IgE 1348.  CXR no acute findings.  Hx smoking cessation approximately 2 years ago per chart review.  Continue DuoNebs/Solu-Medrol/Brovana.  Pulmonology input appreciated  2. Cocaine Use- UDS+ Cocaine. Counsel Cessation.   3. Medical Treatment Plan NonCompliance- Educate pt on importance of medication adherence, Follow up, and Cocaine Cessation.   4.  Disposition??? DC home next 24-48 hours.  If patient remains stable per pulmonology.  Transition to oral steroid taper 1-2 days.  Await pulmonology plan.    NOTE: This report was transcribed using voice recognition software. Every effort was made to ensure accuracy; however, inadvertent computerized transcription errors may be present.     Electronically signed by Wille Celeste. Omelia Blackwater, APRN - CNP on 10/02/2020 at 7:36 AM  HOSPITALIST ATTENDING PHYSICIAN NOTE 10/02/2020 1738PM:    Details of the evaluation - subjective assessment (including medication profile, past medical, family and social history when applicable), examination, review of lab and test data, diagnostic  impressions and medical decision making - performed by Wille Celeste. Omelia Blackwater, APRN - CNP, APN, were discussed with me on the date of service and I agree with clinical information herein unless otherwise noted.    The patient has been evaluated by me personally earlier today.  Pt reports no fevers, chills,n/v.     Exam: heart reg at rate of 84,lungs less wheezing and better chest expansion, abd pos bs soft nt, ext neg for le edema    I agree with the assessment and plan of Wille Celeste. Omelia Blackwater, APRN - CNP    Asthma exacerbation/status asthmaticus  atelectasis  Hyperglycemia likely due to stress/steroids  Cocaine use    Electronically signed by Delsa Bern, D.O.  Hospitalist  [facility]ist [facility] at [facility]

## 2020-10-02 NOTE — Progress Notes (Signed)
[facility]  [facility]  [address], [address]  [place], [place] [address]      Pulmonary Progress Note      SUBJECTIVE:  On RA, sitting up in bed, still with complaints of anxiety with ambulation in room as gets sob and increased cough though better than when admitted.    OBJECTIVE    Medications    Continuous Infusions:    Scheduled Meds:  ??? methylPREDNISolone  40 mg IntraVENous Daily   ??? montelukast  10 mg Oral Nightly   ??? budesonide  1 mg Nebulization BID    And   ??? Arformoterol Tartrate  15 mcg Nebulization BID   ??? ipratropium-albuterol  1 ampule Inhalation Q4H       PRN Meds:    Physical    VITALS:  BP 110/70    Pulse 84    Temp 98.4 ??F (36.9 ??C) (Oral)    Resp 16    Ht 6' (1.829 m)    Wt 158 lb 4 oz (71.8 kg)    SpO2 96%    BMI 21.46 kg/m??     24HR INTAKE/OUTPUT:      Intake/Output Summary (Last 24 hours) at 10/02/2020 1338  Last data filed at 10/02/2020 1020  Gross per 24 hour   Intake 240 ml   Output ???   Net 240 ml       24HR PULSE OXIMETRY RANGE:    SpO2  Avg: 95 %  Min: 94 %  Max: 96 %    General appearance: alert, appears stated age and cooperative  Lungs: rhonchi bibasilar  Heart: regular rate and rhythm, S1, S2 normal, no murmur, click, rub or gallop  Abdomen: soft, non-tender; bowel sounds normal; no masses,  no organomegaly  Extremities: extremities normal, atraumatic, no cyanosis or edema  Neurologic: Mental status: Alert, oriented, thought content appropriate    Data    CBC:   Recent Labs     09/29/20  1621 09/29/20  2335   WBC 6.4 5.1   HGB 15.1 13.8   HCT 46.8 41.4   MCV 87.6 86.3   PLT 219 206       BMP:  Recent Labs     09/29/20  1621 09/29/20  2335   NA 144 141   K 3.9 3.7   CL 109* 109*   CO2 26 22   BUN 16 19   CREATININE 1.1 1.1    ALB:3,BILIDIR:3,BILITOT:3,ALKPHOS:3)@    PT/INR: No results for input(s): PROTIME, INR in the last 72 hours.    ABG:   Recent Labs     09/29/20  2350   PH 7.370   PO2 43.8*   PCO2 44.3   HCO3 25.0   BE -0.5    O2SAT 81.0*   METHB 0.3   O2HB 79.0*   COHB 2.2*   O2CON 16.0   HHB 18.5*   THB 14.4             Radiology/Other tests reviewed: none    Assessment:     Principal Problem:    Asthma exacerbation, mild  Active Problems:    IgE-mediated allergic disorder    Sugar blood level increased    Asthma with status asthmaticus    Atelectasis    Cocaine use    Former smoker  Resolved Problems:    * No resolved hospital problems. *      Plan:       1. Cont  with steroid taper as tolerated, may start po in 1-2 days  2. Cont with nebs, can continue with usual medications as out-pt  3. OOB to chair, ambulate as tolerated  4. Possible discharge in 1-2 days if remains stable      Time at the bedside, reviewing labs and radiographs, reviewing notes and consultations, discussing with staff and family was more than 35 minutes.      Thanks for letting us see this patient in consultation.  Please contact us with any questions. Office [phone] or after hours through [facility], x [phone].

## 2020-10-02 NOTE — Plan of Care (Signed)
Problem: Discharge Planning  Goal: Discharge to home or other facility with appropriate resources  Outcome: Progressing

## 2020-10-03 MED ORDER — FLUTICASONE-SALMETEROL 232-14 MCG/ACT IN AEPB
232-14 | Freq: Two times a day (BID) | RESPIRATORY_TRACT | 0 refills | Status: AC
Start: 2020-10-03 — End: ?

## 2020-10-03 MED ORDER — MONTELUKAST SODIUM 10 MG PO TABS
10 MG | ORAL_TABLET | Freq: Every evening | ORAL | 3 refills | Status: AC
Start: 2020-10-03 — End: ?

## 2020-10-03 MED ORDER — PREDNISONE 10 MG PO TABS
10 MG | ORAL_TABLET | ORAL | 0 refills | Status: AC
Start: 2020-10-03 — End: 2020-10-15

## 2020-10-03 MED FILL — METHYLPREDNISOLONE SODIUM SUCC 40 MG IJ SOLR: 40 mg | INTRAMUSCULAR | Qty: 40

## 2020-10-03 MED FILL — MONTELUKAST SODIUM 10 MG PO TABS: 10 mg | ORAL | Qty: 1

## 2020-10-03 NOTE — Progress Notes (Signed)
Pulmonary Progress Note    Admit Date: 09/29/2020  Hospital day                               PCP: No primary care provider on file.    Chief Complaint (s):  Patient Active Problem List   Diagnosis   ??? Mild persistent asthma without complication   ??? Former smoker   ??? Asthma exacerbation attacks   ??? Exacerbation of asthma   ??? Elevated lactic acid level   ??? Hyperglycemia   ??? Asthma exacerbation, mild   ??? IgE-mediated allergic disorder   ??? Sugar blood level increased   ??? Asthma with status asthmaticus   ??? Atelectasis   ??? Cocaine use       Subjective:  ?? Seen this p.m., the patient is awake and alert.  His breathing is comfortable.  Discussed with the primary service as well as case management.  Issue of compliance is a concern as is follow-up care.  After his last hospitalization, after complete work-up for eosinophil and IgG mediated asthma, the patient never came back in follow-up.  Of note is the patient stating he is unable to afford his medications however his toxicology screen was positive for cocaine.      Vitals:  VITALS:  BP 125/75    Pulse 91    Temp 98.2 ??F (36.8 ??C) (Oral)    Resp 16    Ht 6' (1.829 m)    Wt 153 lb 6 oz (69.6 kg)    SpO2 95%    BMI 20.80 kg/m??     24HR INTAKE/OUTPUT:      Intake/Output Summary (Last 24 hours) at 10/03/2020 1434  Last data filed at 10/02/2020 1745  Gross per 24 hour   Intake 530 ml   Output 2 ml   Net 528 ml       24HR PULSE OXIMETRY RANGE:    SpO2  Avg: 95 %  Min: 94 %  Max: 96 %    Medications:  IV:      Scheduled Meds:  ??? methylPREDNISolone  40 mg IntraVENous Daily   ??? montelukast  10 mg Oral Nightly   ??? budesonide  1 mg Nebulization BID    And   ??? Arformoterol Tartrate  15 mcg Nebulization BID   ??? ipratropium-albuterol  1 ampule Inhalation Q4H       Diet:   ADULT DIET; Regular     EXAM:  General: No distress. Alert.  Eyes: PERRL. No sclera icterus. No conjunctival injection.  ENT: No discharge. Pharynx clear.  Neck: Trachea midline. Normal thyroid.  Resp:  No accessory muscle use.  No rales.  Minimal wheezing.  No rhonchi.   CV: Regular rate. Regular rhythm. No murmur or rub.   Abd: Non-tender. Non-distended. No masses. No organomegaly. Normal bowel sounds.   Skin: Warm and dry. No nodule on exposed extremities. No rash on exposed extremities.  Ext: No cyanosis, clubbing, edema  Lymph: No cervical LAD. No supraclavicular LAD.   M/S: No cyanosis. No joint deformity. No clubbing.   Neuro: Awake. Follows commands. Positive pupils/gag/corneals. Normal pain response.       Results:  CBC: No results for input(s): WBC, HGB, HCT, MCV, PLT in the last 72 hours.  BMP: No results for input(s): NA, K, CL, CO2, PHOS, BUN, CREATININE, CA in the last 72 hours.  LIVER PROFILE: No results for input(s): AST, ALT, LIPASE, BILIDIR,  BILITOT, ALKPHOS in the last 72 hours.    Invalid input(s):  AMYLASE,  ALB  PT/INR: No results for input(s): PROTIME, INR in the last 72 hours.  APTT: No results for input(s): APTT in the last 72 hours.      Pathology:  1. N/A      Microbiology:  1. None    Recent ABG:   No results for input(s): PH, PO2, PCO2, HCO3, BE, O2SAT, METHB, O2HB, COHB, O2CON, HHB, THB in the last 72 hours.          Recent Films:  XR CHEST PORTABLE   Final Result   No acute findings.             Assessment:  1. 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 Drug abuse    Plan:  1. Okay to discharge on air duo 232, at [facility] its reasonably priced and under $35 monthly  2. Prednisone taper  3. Home nebulizer  4. As the patient is not consistently compliant, immunologic's will be deferred at this time.    Time at the bedside, reviewing labs and radiographs, reviewing updated notes and consultations, discussing with staff and family was more than 35 minutes.    Please note that voice recognition technology was used in the preparation of this note and make therefore it may contain inadvertent transcription errors.  If the patient is a COVID 19 isolation patient, the  above physical exam reflects that of the examining physician for the day.        Lubertha Basque, MD,  M.D., F.C.C.P.    [facility]    [facility], [address], [place], [place] [address]

## 2020-10-03 NOTE — Progress Notes (Signed)
CLINICAL PHARMACY NOTE: MEDS TO BEDS    Total # of Prescriptions Filled: 3   The following medications were delivered to the patient:  ?? Wixela 250-50  ?? Singular 10 mg   ?? Prednisone 10 mg       Additional Documentation:

## 2020-10-03 NOTE — Discharge Summary (Signed)
[facility] Hospitalist       Hospitalist Physician Discharge Summary       [facility] Pre-Service  [phone]  Schedule an appointment as soon as possible for a visit in 1 week  Please call for follow up post hospital stay appt.      Activity level: As Tolerated    Diet: ADULT DIET; Regular      Condition at discharge: Stable    Dispo:Home    Patient ID:  Ray Rodgers  [id number]  40 y.o.  06/09/1980    Admit date: 09/29/2020    Discharge date and time:  10/03/2020  2:41 PM    Admission Diagnoses: Principal Problem:    Asthma exacerbation, mild  Active Problems:    IgE-mediated allergic disorder    Sugar blood level increased    Asthma with status asthmaticus    Atelectasis    Cocaine use    Former smoker  Resolved Problems:    * No resolved hospital problems. *      Discharge Diagnoses: Principal Problem:    Asthma exacerbation, mild  Active Problems:    IgE-mediated allergic disorder    Sugar blood level increased    Asthma with status asthmaticus    Atelectasis    Cocaine use    Former smoker  Resolved Problems:    * No resolved hospital problems. *      Consults:  IP CONSULT TO PULMONOLOGY    Procedures: None    Hospital Course:   09/30/20 40 year old male presented to [facility] ED with increased shortness of breath beginning 1 day prior to presentation.  Patient seen in ED [facility] earlier day of presentation.  Received DuoNeb's and steroids discharged home in stable condition.  Return to ED with increased shortness of breath and asthma exacerbation.  1.????Acute asthma exacerbation?????has been stretching out Advair only using once daily. ??Recently ran out of Advair/albuterol over the last week. ??Was seen at [facility]??evening prior to presentation. ??Received duo nebs x2 and discharged home. ??Presented back to ED later that night. ??Received magnesium sulfate 2g IV/Duoneb/prednisone??in ED course. Eosinophilia 10.0 IgE 1348.????CXR no acute findings. ??Hx smoking cessation approximately 2 years ago per chart  review. ??Continue DuoNebs/Solu-Medrol/Brovana.????Pulmonology input appreciated  2. Cocaine Use- UDS+ Cocaine. Counsel Cessation.   3. Medical Treatment Plan NonCompliance- Educate pt on importance of medication adherence, Follow up, and Cocaine Cessation.     Patient remained hemodynamically stable and can be discharged at this time.  Patient will be instructed to establish PCP care as outpatient.  [place] pre service given to patient.        Discharge Exam:  Vitals:    10/02/20 2100 10/03/20 0047 10/03/20 0853 10/03/20 1345   BP: 120/68  125/75    Pulse: 85  (!) 104 91   Resp: 18  20 16    Temp: 98.5 ??F (36.9 ??C)  98.2 ??F (36.8 ??C)    TempSrc: Oral  Oral    SpO2: 95%  94% 95%   Weight:  153 lb 6 oz (69.6 kg)     Height:         General Appearance: alert and oriented to person, place and time and in no acute distress  Skin: warm and dry  Head: normocephalic and atraumatic  Eyes: pupils equal, round, and reactive to light, extraocular eye movements intact, conjunctivae normal  Neck: neck supple and non tender without mass   Pulmonary/Chest: Exp Wheeze  to auscultation bilaterally- no  rales or rhonchi,  normal air movement, no respiratory distress  Cardiovascular: normal rate, normal S1 and S2 and no carotid bruits  Abdomen: soft, non-tender, non-distended, normal bowel sounds,  Extremities: no cyanosis, no clubbing and no edema  Neurologic: no cranial nerve deficit and speech normal    I/O last 3 completed shifts:  In: 1070 [P.O.:1070]  Out: 3 [Urine:3]  No intake/output data recorded.      LABS:  No results for input(s): NA, K, CL, CO2, BUN, CREATININE, GLUCOSE, CALCIUM in the last 72 hours.    No results for input(s): WBC, RBC, HGB, HCT, MCV, MCH, MCHC, RDW, PLT, MPV in the last 72 hours.    No results for input(s): POCGLU in the last 72 hours.        Imaging:  XR CHEST (2 VW)    Result Date: 09/29/2020  EXAMINATION: TWO XRAY VIEWS OF THE CHEST 09/29/2020 2:51 pm COMPARISON: None. HISTORY: ORDERING SYSTEM PROVIDED  HISTORY: asthma TECHNOLOGIST PROVIDED HISTORY: Reason for exam:->asthma What reading provider will be dictating this exam?->CRC FINDINGS: Hyperaeration compatible with obstructive airways disease.  Normal heart and pulmonary vascularity.  Neither costophrenic angle is blunted.     Obstructive airways disease.     XR CHEST PORTABLE    Result Date: 09/30/2020  EXAMINATION: ONE XRAY VIEW OF THE CHEST 09/30/2020 12:44 am COMPARISON: September 29, 2020. HISTORY: ORDERING SYSTEM PROVIDED HISTORY: asthma flare TECHNOLOGIST PROVIDED HISTORY: Reason for exam:->asthma flare FINDINGS: The cardiomediastinal contours and pulmonary vasculature are within normal limits.  No effusion, focal consolidation or pneumothorax is seen. No acute osseous abnormalities are identified.     No acute findings.         Patient Instructions:   Current Discharge Medication List      START taking these medications    Details   montelukast (SINGULAIR) 10 MG tablet Take 1 tablet by mouth nightly  Qty: 30 tablet, Refills: 3      predniSONE (DELTASONE) 10 MG tablet Take 4 tablets by mouth daily for 3 days, THEN 3 tablets daily for 3 days, THEN 2 tablets daily for 3 days, THEN 1 tablet daily for 3 days.  Qty: 30 tablet, Refills: 0      Fluticasone-Salmeterol 232-14 MCG/ACT AEPB Inhale 1 inhalation into the lungs in the morning and at bedtime  Qty: 1 each, Refills: 0         CONTINUE these medications which have NOT CHANGED    Details   albuterol sulfate HFA (PROVENTIL HFA) 108 (90 Base) MCG/ACT inhaler Inhale 2 puffs into the lungs every 4 hours as needed for Wheezing  Qty: 1 each, Refills: 1         STOP taking these medications       fluticasone-salmeterol (ADVAIR DISKUS) 250-50 MCG/ACT AEPB diskus inhaler Comments:   Reason for Stopping:                 Note that more  than 30 minutes was spent in preparing discharge papers, discussing discharge with patient, medication review, etc.    NOTE: This report was transcribed using voice recognition software.  Every effort was made to ensure accuracy; however, inadvertent computerized transcription errors may be present.     Signed:  Electronically signed by Wille Celeste. Omelia Blackwater, APRN - CNP on 10/03/2020 at 2:41 PM   HOSPITALIST ATTENDING PHYSICIAN NOTE 10/03/2020 1653PM:    Details of the evaluation - subjective assessment (including medication profile, past medical, family and social history when applicable), examination, review of lab and test  data, diagnostic impressions and medical decision making - performed by Wille Celeste. Omelia Blackwater, APRN - CNP, were discussed with me on the date of service and I agree with clinical information herein unless otherwise noted.    The patient has been evaluated by me personally earlier today.  Pt reports no fevers, chills,n/v. Breathing better    Exam: rhythm reg at rate of 88,lungs cta, abd pos bs soft nt, ext neg for le edema    I agree with the assessment and plan of Wille Celeste. Omelia Blackwater, APRN - CNP      Asthma??exacerbation/status asthmaticus(eosinophil mediated/IgE mediated)  atelectasis  Hyperglycemia likely due to stress/steroids  Cocaine use      Total time for discharge is 37 min    Electronically signed by Delsa Bern, D.O.  Hospitalist  [facility] Hospitalist Service at [facility]

## 2020-10-03 NOTE — Care Coordination-Inpatient (Signed)
10/03/2020  Social Work Discharge Planning: Plan is home at discharge. Pt has no PCP-info to follow up with PCP Pre-service is in Pts discharge followup. Electronically signed by Laverna Peace, LSW on 10/03/2020 at 10:35 AM

## 2020-10-03 NOTE — Discharge Instructions (Signed)
To establish with a family doctor, please call [phone] after discharge . They can assist you

## 2020-10-04 DIAGNOSIS — J4541 Moderate persistent asthma with (acute) exacerbation: Secondary | ICD-10-CM

## 2020-10-04 NOTE — ED Provider Notes (Addendum)
[place] [facility] EMERGENCY DEPARTMENT  EMERGENCY DEPARTMENT ENCOUNTER      Pt Name: Ray Rodgers  MRN: [id number]  Birthdate 03/16/1981  Date of evaluation: 10/04/2020      CHIEF COMPLAINT       Chief Complaint   Patient presents with   ??? Shortness of Breath   ??? Asthma        HPI  Ray Rodgers is a 40 y.o. male  with PMHx of asthma presents with breath. Symptoms started yesterday while he was at rest, symptoms have been worsening, associated with dry cough.  Endorses sharp chest pain in the midsternal region, rated 6 out of 10, sharp, nonradiating, worsened with deep breaths and coughing, with no alleviating factors. Patient states that he was recently discharged from [facility] yesterday for asthmatic exacerbation.  He was discharged home with albuterol inhaler, but states that he has not used it since discharge.  Patient states that he got home and felt short of breath so he decided to walk back to the ED.  Patient has been seen in the ED this month several times similar problems with reported noncompliance to medication. Describes symptoms moderate in severity. Denies any fever, chills, n/v, headache, dizziness, vision changes, neck tenderness or stiffness, weakness, cp, palpitations, leg swelling/tenderness, abd pain, dysuria, hematuria, diarrhea, constipation.  Previous smoker- last time 2 weeks ago.     Except as noted above the remainder of the review of systems was reviewed and negative.     Review of Systems   Constitutional: Negative for appetite change, chills, fatigue and fever.   HENT: Negative for congestion and sore throat.    Eyes: Negative for visual disturbance.   Respiratory: Positive for cough, chest tightness and shortness of breath. Negative for choking.    Cardiovascular: Negative for chest pain, palpitations and leg swelling.   Gastrointestinal: Negative for abdominal pain, blood in stool, constipation, diarrhea, nausea and vomiting.   Endocrine:  Negative for polyphagia.   Genitourinary: Negative for decreased urine volume, difficulty urinating, flank pain and hematuria.   Musculoskeletal: Negative for arthralgias, gait problem, joint swelling and myalgias.   Skin: Negative for color change, pallor, rash and wound.   Neurological: Negative for dizziness, tremors, seizures, syncope, weakness, light-headedness, numbness and headaches.   Hematological: Negative for adenopathy. Does not bruise/bleed easily.   Psychiatric/Behavioral: Negative for confusion and hallucinations.   All other systems reviewed and are negative.       Physical Exam  Vitals reviewed.   Constitutional:       General: He is not in acute distress.     Appearance: Normal appearance. He is well-developed and normal weight. He is not ill-appearing, toxic-appearing or diaphoretic.   HENT:      Head: Normocephalic and atraumatic.      Right Ear: External ear normal.      Left Ear: External ear normal.      Nose: Nose normal. No congestion or rhinorrhea.      Mouth/Throat:      Mouth: Mucous membranes are moist.      Pharynx: Oropharynx is clear. No oropharyngeal exudate or posterior oropharyngeal erythema.   Eyes:      Extraocular Movements: Extraocular movements intact.      Conjunctiva/sclera: Conjunctivae normal.      Pupils: Pupils are equal, round, and reactive to light.   Cardiovascular:      Rate and Rhythm: Normal rate and regular rhythm.      Pulses:  Normal pulses.   Pulmonary:      Effort: Pulmonary effort is normal. No respiratory distress.      Breath sounds: Decreased breath sounds and wheezing present. No rhonchi or rales.   Chest:      Chest wall: No tenderness.   Abdominal:      General: Abdomen is flat. Bowel sounds are normal. There is no distension.      Palpations: Abdomen is soft.      Tenderness: There is no abdominal tenderness. There is no right CVA tenderness, left CVA tenderness or guarding.      Hernia: No hernia is present.   Musculoskeletal:      Cervical back:  Normal range of motion.      Right lower leg: No tenderness. No edema.      Left lower leg: No tenderness. No edema.   Skin:     General: Skin is warm and dry.      Capillary Refill: Capillary refill takes less than 2 seconds.   Neurological:      General: No focal deficit present.      Mental Status: He is alert and oriented to person, place, and time. Mental status is at baseline.   Psychiatric:         Mood and Affect: Mood normal.         Behavior: Behavior normal.         Thought Content: Thought content normal.         Judgment: Judgment normal.          Procedures     MDM      40 y.o. male  with PMHx of asthma presents with breath. Symptoms started yesterday while he was at rest, symptoms have been worsening, associated with dry cough. While in the ED patient was hemodynamically stable, afebrile, nontoxic-appearing, in no respiratory distress.  Physical exam remarkable for diminished breath sounds with no wheezing.  Patient appears to be uncomfortable but not in respiratory distress. Labs unremarkable. EKG normal sinus with no sign of acute ischemia.  CXR negative for any acute pathologies. Patient received 3 duoneb, solumedrol, mag with significant symptomatic relief. Repeat physical exam significantly improved with no wheezing and improved breath sounds. Patient feels comfortable going home and will follow up outpatient with PCP. He states he will be compliant with inhaler and he would like a referral for PCP. Patient understands to return to the ED in case of worsening symptoms.         ED Course as of 10/05/20 0655   Wed Oct 05, 2020   0543 EKG:  This EKG is signed by emergency department physician.    Rate: 79  Rhythm: Sinus  Interpretation: non-specific EKG  Comparison: stable as compared to patient's most recent EKG      [TC]   0544 CXR IMPRESSION:  No acute findings. [TC]   0601 States he feels significantly better after breathing treatment, mag, and steroids. He feels comfortable going home. Will  ambulate will pulse ox. Patient states he has prescriptions at home. He would like PCP referral [TC]   0648 AST: 17 [TC]      ED Course User Index  [TC] Adah Salvage, MD       --------------------------------------------- PAST HISTORY ---------------------------------------------  Past Medical History:  has a past medical history of Asthma.    Past Surgical History:  has no past surgical history on file.    Social History:  reports that he quit smoking  about 2 years ago. His smoking use included cigarettes. He has never used smokeless tobacco. He reports that he does not drink alcohol and does not use drugs.    Family History: family history is not on file.     The patient???s home medications have been reviewed.    Allergies: Patient has no known allergies.    -------------------------------------------------- RESULTS -------------------------------------------------  Labs:  Results for orders placed or performed during the hospital encounter of 10/05/20   Troponin   Result Value Ref Range    Troponin, High Sensitivity <6 0 - 11 ng/L   CBC with Auto Differential   Result Value Ref Range    WBC 11.3 4.5 - 11.5 E9/L    RBC 5.04 3.80 - 5.80 E12/L    Hemoglobin 14.4 12.5 - 16.5 g/dL    Hematocrit 54.044.3 98.137.0 - 54.0 %    MCV 87.9 80.0 - 99.9 fL    MCH 28.6 26.0 - 35.0 pg    MCHC 32.5 32.0 - 34.5 %    RDW 13.8 11.5 - 15.0 fL    Platelets 238 130 - 450 E9/L    MPV 10.2 7.0 - 12.0 fL    Neutrophils % 71.2 43.0 - 80.0 %    Immature Granulocytes % 0.8 0.0 - 5.0 %    Lymphocytes % 18.9 (L) 20.0 - 42.0 %    Monocytes % 8.0 2.0 - 12.0 %    Eosinophils % 0.4 0.0 - 6.0 %    Basophils % 0.7 0.0 - 2.0 %    Neutrophils Absolute 8.04 (H) 1.80 - 7.30 E9/L    Immature Granulocytes # 0.09 E9/L    Lymphocytes Absolute 2.14 1.50 - 4.00 E9/L    Monocytes Absolute 0.90 0.10 - 0.95 E9/L    Eosinophils Absolute 0.05 0.05 - 0.50 E9/L    Basophils Absolute 0.08 0.00 - 0.20 E9/L   Comprehensive Metabolic Panel   Result Value Ref Range    Sodium  138 132 - 146 mmol/L    Potassium 4.2 3.5 - 5.0 mmol/L    Chloride 103 98 - 107 mmol/L    CO2 23 22 - 29 mmol/L    Anion Gap 12 7 - 16 mmol/L    Glucose 82 74 - 99 mg/dL    BUN 21 (H) 6 - 20 mg/dL    CREATININE 0.9 0.7 - 1.2 mg/dL    GFR Non-African American >60 >=60 mL/min/1.73    GFR African American >60     Calcium 9.1 8.6 - 10.2 mg/dL    Total Protein 5.8 (L) 6.4 - 8.3 g/dL    Albumin 3.6 3.5 - 5.2 g/dL    Total Bilirubin <1.9<0.2 0.0 - 1.2 mg/dL    Alkaline Phosphatase 97 40 - 129 U/L    ALT 23 0 - 40 U/L    AST 17 0 - 39 U/L   D-Dimer, Quantitative   Result Value Ref Range    D-Dimer, Quant <200 ng/mL DDU   EKG 12 Lead   Result Value Ref Range    Ventricular Rate 79 BPM    Atrial Rate 79 BPM    P-R Interval 152 ms    QRS Duration 86 ms    Q-T Interval 348 ms    QTc Calculation (Bazett) 399 ms    P Axis 85 degrees    R Axis 54 degrees    T Axis 69 degrees       Radiology:  XR CHEST PORTABLE   Final  Result   No acute findings.             ------------------------- NURSING NOTES AND VITALS REVIEWED ---------------------------  Date / Time Roomed:  10/05/2020  5:14 AM  ED Bed Assignment:  HALL CC/HCC    The nursing notes within the ED encounter and vital signs as below have been reviewed.   BP 115/70    Pulse 89    Temp 98 ??F (36.7 ??C)    Resp 17    Ht 6' (1.829 m)    Wt 153 lb (69.4 kg)    SpO2 97%    BMI 20.75 kg/m??   Oxygen Saturation Interpretation: Normal      ------------------------------------------ PROGRESS NOTES ------------------------------------------  6:47 AM EDT  I have spoken with the patient and discussed today???s results, in addition to providing specific details for the plan of care and counseling regarding the diagnosis and prognosis.  Their questions are answered at this time and they are agreeable with the plan. I discussed at length with them reasons for immediate return here for re evaluation. They will followup with their primary care physician by calling their office  tomorrow.      --------------------------------- ADDITIONAL PROVIDER NOTES ---------------------------------  At this time the patient is without objective evidence of an acute process requiring hospitalization or inpatient management.  They have remained hemodynamically stable throughout their entire ED visit and are stable for discharge with outpatient follow-up.     The plan has been discussed in detail and they are aware of the specific conditions for emergent return, as well as the importance of follow-up.      New Prescriptions    No medications on file       Diagnosis:  1. Moderate persistent asthma with exacerbation        Disposition:  Patient's disposition: Discharge to home  Patient's condition is stable.           Adah Salvage, MD  Resident  10/05/20 [phone]  ATTENDING PROVIDER ATTESTATION:     I have personally performed and/or participated in the history, exam, medical decision making, and procedures and agree with all pertinent clinical information.      I have also reviewed and agree with the past medical, family and social history unless otherwise noted.    I have discussed this patient in detail with the resident, and provided the instruction and education regarding shortness of breath.    My findings/Plan: I Was the primary provider for patient.  Patient present here because of shortness of breath.  Patient reports he was just recently in the hospital for the asthma.  Patient reporting some mild cough.  Patient reporting no abdominal pain or vomiting.  Patient reporting no leg pain or swelling.  Patient on exam is in mild distress.  Lungs are diminished heart exam normal abdomen is soft nontender he has no edema he is neurologically intact.  Patient labs and EKG noted reviewed.  Patient chest x-ray noted D-dimer less than 200.  Patient medicated with breathing treatments here.  Patient also was ordered Solu-Medrol magnesium.  Patient on recheck here little after 6:50 AM.  Patient breath sounds  noted and patient is in no distress.  Patient reporting much improved.  Patient still receiving magnesium here in the IV.  Patient does have inhaler at home as well on steroid taper.  Patient will be discharged home.  Plan will be to ambulate patient prior to discharge.  His last pulse ox remains above  92% plan will be to have patient follow-up outpatient.       Dwain Sarna, MD  10/05/20 7829       Dwain Sarna, MD  10/05/20 [phone]

## 2020-10-04 NOTE — ED Notes (Signed)
Department of Emergency Medicine  FIRST PROVIDER TRIAGE NOTE             Independent MLP           10/04/20  11:51 PM EDT    Date of Encounter: 10/04/20   MRN: [id number]      HPI: Ray Rodgers is a 40 y.o. male who presents to the ED for Shortness of Breath and Asthma      ROS: Negative for cp or vomiting.    PE: Gen Appearance/Constitutional: alert  Pulm: abnormal breath sounds auscultated     Initial Plan of Care: All treatment areas with department are currently occupied. Plan to order/Initiate the following while awaiting opening in ED: labs, EKG and imaging studies.  Initiate Treatment-Testing, Proceed toTreatment Area When Bed Available for ED Attending/MLP to Continue Care    Electronically signed by Ellery Plunk, APRN - CNP   DD: 10/04/20         Ellery Plunk, APRN - CNP  10/04/20 2351    ATTENDING PROVIDER ATTESTATION:     Supervising Physician, on-site, available for consultation, non-participatory in the evaluation or care of this patient         Dwain Sarna, MD  10/05/20 0005

## 2020-10-05 ENCOUNTER — Inpatient Hospital Stay
Admit: 2020-10-05 | Discharge: 2020-10-05 | Disposition: A | Discharge status: Home / Self Care | Payer: PRIVATE HEALTH INSURANCE | Attending: Emergency Medicine

## 2020-10-05 ENCOUNTER — Emergency Department: Admit: 2020-10-05 | Payer: PRIVATE HEALTH INSURANCE

## 2020-10-05 LAB — CBC WITH AUTO DIFFERENTIAL
Basophils %: 0.7 % (ref 0.0–2.0)
Basophils Absolute: 0.08 E9/L (ref 0.00–0.20)
Eosinophils %: 0.4 % (ref 0.0–6.0)
Eosinophils Absolute: 0.05 E9/L (ref 0.05–0.50)
Hematocrit: 44.3 % (ref 37.0–54.0)
Hemoglobin: 14.4 g/dL (ref 12.5–16.5)
Immature Granulocytes #: 0.09 E9/L
Immature Granulocytes %: 0.8 % (ref 0.0–5.0)
Lymphocytes %: 18.9 % — ABNORMAL LOW (ref 20.0–42.0)
Lymphocytes Absolute: 2.14 E9/L (ref 1.50–4.00)
MCH: 28.6 pg (ref 26.0–35.0)
MCHC: 32.5 % (ref 32.0–34.5)
MCV: 87.9 fL (ref 80.0–99.9)
MPV: 10.2 fL (ref 7.0–12.0)
Monocytes %: 8 % (ref 2.0–12.0)
Monocytes Absolute: 0.9 E9/L (ref 0.10–0.95)
Neutrophils %: 71.2 % (ref 43.0–80.0)
Neutrophils Absolute: 8.04 E9/L — ABNORMAL HIGH (ref 1.80–7.30)
Platelets: 238 E9/L (ref 130–450)
RBC: 5.04 E12/L (ref 3.80–5.80)
RDW: 13.8 fL (ref 11.5–15.0)
WBC: 11.3 E9/L (ref 4.5–11.5)

## 2020-10-05 LAB — COMPREHENSIVE METABOLIC PANEL
ALT: 23 U/L (ref 0–40)
AST: 17 U/L (ref 0–39)
Albumin: 3.6 g/dL (ref 3.5–5.2)
Alkaline Phosphatase: 97 U/L (ref 40–129)
Anion Gap: 12 mmol/L (ref 7–16)
BUN: 21 mg/dL — ABNORMAL HIGH (ref 6–20)
CO2: 23 mmol/L (ref 22–29)
Calcium: 9.1 mg/dL (ref 8.6–10.2)
Chloride: 103 mmol/L (ref 98–107)
Creatinine: 0.9 mg/dL (ref 0.7–1.2)
GFR African American: >60
GFR Non-African American: >60 mL/min/{1.73_m2} (ref >=60)
Glucose: 82 mg/dL (ref 74–99)
Potassium: 4.2 mmol/L (ref 3.5–5.0)
Sodium: 138 mmol/L (ref 132–146)
Total Bilirubin: <0.2 mg/dL (ref 0.0–1.2)
Total Protein: 5.8 g/dL — ABNORMAL LOW (ref 6.4–8.3)

## 2020-10-05 LAB — EKG 12-LEAD
Atrial Rate: 79 {beats}/min
P Axis: 85 degrees
P-R Interval: 152 ms
Q-T Interval: 348 ms
QRS Duration: 86 ms
QTc Calculation (Bazett): 399 ms
R Axis: 54 degrees
T Axis: 69 degrees
Ventricular Rate: 79 {beats}/min

## 2020-10-05 LAB — D-DIMER, QUANTITATIVE: D-Dimer, Quant: <200 ng/mL DDU

## 2020-10-05 LAB — TROPONIN: Troponin, High Sensitivity: <6 ng/L (ref 0–11)

## 2020-10-05 LAB — IGE: IgE: 744 kU/L — ABNORMAL HIGH (ref <=214)

## 2020-10-05 MED ORDER — IPRATROPIUM-ALBUTEROL 0.5-2.5 (3) MG/3ML IN SOLN
Freq: Once | RESPIRATORY_TRACT | Status: AC
Start: 2020-10-05 — End: 2020-10-05
  Administered 2020-10-05: 10:00:00 1 via RESPIRATORY_TRACT

## 2020-10-05 MED ORDER — METHYLPREDNISOLONE SODIUM SUCC 125 MG IJ SOLR
125 MG | Freq: Once | INTRAMUSCULAR | Status: AC
Start: 2020-10-05 — End: 2020-10-05
  Administered 2020-10-05: 10:00:00 125 mg via INTRAVENOUS

## 2020-10-05 MED ORDER — MAGNESIUM SULFATE 2000 MG/50 ML IVPB PREMIX
2 GM/50ML | Freq: Once | INTRAVENOUS | Status: AC
Start: 2020-10-05 — End: 2020-10-05
  Administered 2020-10-05: 10:00:00 2000 mg via INTRAVENOUS

## 2020-10-05 MED ORDER — IPRATROPIUM-ALBUTEROL 0.5-2.5 (3) MG/3ML IN SOLN
Freq: Once | RESPIRATORY_TRACT | Status: AC
Start: 2020-10-05 — End: 2020-10-05
  Administered 2020-10-05: 04:00:00 3 via RESPIRATORY_TRACT

## 2020-10-05 MED FILL — IPRATROPIUM-ALBUTEROL 0.5-2.5 (3) MG/3ML IN SOLN: 0.5-2.5 (3) MG/3ML | RESPIRATORY_TRACT | Qty: 3

## 2020-10-05 MED FILL — SOLU-MEDROL 125 MG IJ SOLR: 125 mg | INTRAMUSCULAR | Qty: 125

## 2020-10-05 MED FILL — MAGNESIUM SULFATE 2 GM/50ML IV SOLN: 2 GM/50ML | INTRAVENOUS | Qty: 50

## 2020-10-05 MED FILL — IPRATROPIUM-ALBUTEROL 0.5-2.5 (3) MG/3ML IN SOLN: 0.5-2.5 (3) MG/3ML | RESPIRATORY_TRACT | Qty: 9

## 2020-10-05 NOTE — ED Notes (Signed)
Pulse ox 97% on room air while ambulating. No sob or c/o's noted.      Crist Infante, RN  10/05/20 [phone]

## 2020-10-05 NOTE — Progress Notes (Signed)
Patient ambulated up and down the hallway with no breathing difficulty. SpO2 maintained at 96% RA

## 2020-10-05 NOTE — Progress Notes (Signed)
resp tx done in chair 1 and will bring pt back to waiting room when done

## 2020-10-05 NOTE — ED Notes (Signed)
Pt vital signs rechecked, pt aware still waiting for er bed, no voiced c/o's at this time.     Delora Fuel, LPN  22/63/33 [id number]

## 2020-10-12 LAB — HYPERSENSITIVITY PNUEMONITIS PROFILE
A fumigatus #1: NOT DETECTED
A fumigatus #6: NOT DETECTED
A pullulans: NOT DETECTED
M faeni: NOT DETECTED
Pigeon Serum Abs: NOT DETECTED

## 2020-12-05 ENCOUNTER — Inpatient Hospital Stay
Admit: 2020-12-05 | Discharge: 2020-12-05 | Disposition: A | Discharge status: Home / Self Care | Payer: PRIVATE HEALTH INSURANCE | Attending: Emergency Medicine

## 2020-12-05 ENCOUNTER — Emergency Department: Admit: 2020-12-05 | Payer: PRIVATE HEALTH INSURANCE

## 2020-12-05 DIAGNOSIS — J069 Acute upper respiratory infection, unspecified: Secondary | ICD-10-CM

## 2020-12-05 LAB — EKG 12-LEAD
Atrial Rate: 100 {beats}/min
P Axis: 81 degrees
P-R Interval: 154 ms
Q-T Interval: 338 ms
QRS Duration: 86 ms
QTc Calculation (Bazett): 436 ms
R Axis: 44 degrees
T Axis: -5 degrees
Ventricular Rate: 100 {beats}/min

## 2020-12-05 LAB — COMPREHENSIVE METABOLIC PANEL W/ REFLEX TO MG FOR LOW K
ALT: 42 U/L — ABNORMAL HIGH (ref 0–40)
AST: 32 U/L (ref 0–39)
Albumin: 4.4 g/dL (ref 3.5–5.2)
Alkaline Phosphatase: 123 U/L (ref 40–129)
Anion Gap: 11 mmol/L (ref 7–16)
BUN: 16 mg/dL (ref 6–20)
CO2: 23 mmol/L (ref 22–29)
Calcium: 9.2 mg/dL (ref 8.6–10.2)
Chloride: 109 mmol/L — ABNORMAL HIGH (ref 98–107)
Creatinine: 1.3 mg/dL — ABNORMAL HIGH (ref 0.7–1.2)
GFR African American: >60
GFR Non-African American: >60 mL/min/{1.73_m2} (ref >=60)
Glucose: 96 mg/dL (ref 74–99)
Potassium reflex Magnesium: 3.9 mmol/L (ref 3.5–5.0)
Sodium: 143 mmol/L (ref 132–146)
Total Bilirubin: 0.3 mg/dL (ref 0.0–1.2)
Total Protein: 7.3 g/dL (ref 6.4–8.3)

## 2020-12-05 LAB — COVID-19, RAPID: SARS-CoV-2, NAAT: NOT DETECTED

## 2020-12-05 LAB — CBC WITH AUTO DIFFERENTIAL
Basophils %: 1.1 % (ref 0.0–2.0)
Basophils Absolute: 0.08 E9/L (ref 0.00–0.20)
Eosinophils %: 7.3 % — ABNORMAL HIGH (ref 0.0–6.0)
Eosinophils Absolute: 0.55 E9/L — ABNORMAL HIGH (ref 0.05–0.50)
Hematocrit: 43.7 % (ref 37.0–54.0)
Hemoglobin: 14.2 g/dL (ref 12.5–16.5)
Immature Granulocytes #: 0.02 E9/L
Immature Granulocytes %: 0.3 % (ref 0.0–5.0)
Lymphocytes %: 15.7 % — ABNORMAL LOW (ref 20.0–42.0)
Lymphocytes Absolute: 1.19 E9/L — ABNORMAL LOW (ref 1.50–4.00)
MCH: 28 pg (ref 26.0–35.0)
MCHC: 32.5 % (ref 32.0–34.5)
MCV: 86 fL (ref 80.0–99.9)
MPV: 9.7 fL (ref 7.0–12.0)
Monocytes %: 9.6 % (ref 2.0–12.0)
Monocytes Absolute: 0.73 E9/L (ref 0.10–0.95)
Neutrophils %: 66 % (ref 43.0–80.0)
Neutrophils Absolute: 5.01 E9/L (ref 1.80–7.30)
Platelets: 237 E9/L (ref 130–450)
RBC: 5.08 E12/L (ref 3.80–5.80)
RDW: 13.1 fL (ref 11.5–15.0)
WBC: 7.6 E9/L (ref 4.5–11.5)

## 2020-12-05 LAB — D-DIMER, QUANTITATIVE: D-Dimer, Quant: <200 ng/mL DDU

## 2020-12-05 LAB — TROPONIN
Troponin, High Sensitivity: 17 ng/L — ABNORMAL HIGH (ref 0–11)
Troponin, High Sensitivity: 13 ng/L — ABNORMAL HIGH (ref 0–11)

## 2020-12-05 LAB — BRAIN NATRIURETIC PEPTIDE: Pro-BNP: 32 pg/mL (ref 0–125)

## 2020-12-05 MED ORDER — IPRATROPIUM-ALBUTEROL 0.5-2.5 (3) MG/3ML IN SOLN
Freq: Once | RESPIRATORY_TRACT | Status: AC
Start: 2020-12-05 — End: 2020-12-05
  Administered 2020-12-05: 21:00:00 1 via RESPIRATORY_TRACT

## 2020-12-05 MED ORDER — PREDNISONE 50 MG PO TABS
50 MG | ORAL_TABLET | Freq: Every day | ORAL | 0 refills | Status: AC
Start: 2020-12-05 — End: 2020-12-10

## 2020-12-05 MED ORDER — METHYLPREDNISOLONE SODIUM SUCC 125 MG IJ SOLR
125 MG | Freq: Once | INTRAMUSCULAR | Status: AC
Start: 2020-12-05 — End: 2020-12-05
  Administered 2020-12-05: 20:00:00 80 mg via INTRAVENOUS

## 2020-12-05 MED ORDER — KETOROLAC TROMETHAMINE 30 MG/ML IJ SOLN
30 MG/ML | Freq: Once | INTRAMUSCULAR | Status: AC
Start: 2020-12-05 — End: 2020-12-05
  Administered 2020-12-05: 20:00:00 15 mg via INTRAVENOUS

## 2020-12-05 MED ORDER — ALBUTEROL SULFATE HFA 108 (90 BASE) MCG/ACT IN AERS
108 (90 Base) MCG/ACT | Freq: Four times a day (QID) | RESPIRATORY_TRACT | 0 refills | Status: DC | PRN
Start: 2020-12-05 — End: 2020-12-26

## 2020-12-05 MED ORDER — AZITHROMYCIN 250 MG PO TABS
250 MG | PACK | ORAL | 0 refills | Status: AC
Start: 2020-12-05 — End: 2020-12-09

## 2020-12-05 MED FILL — IPRATROPIUM-ALBUTEROL 0.5-2.5 (3) MG/3ML IN SOLN: 0.5-2.5 (3) MG/3ML | RESPIRATORY_TRACT | Qty: 3

## 2020-12-05 MED FILL — KETOROLAC TROMETHAMINE 30 MG/ML IJ SOLN: 30 mg/mL | INTRAMUSCULAR | Qty: 1

## 2020-12-05 MED FILL — SOLU-MEDROL 125 MG IJ SOLR: 125 mg | INTRAMUSCULAR | Qty: 125

## 2020-12-05 NOTE — ED Notes (Signed)
Department of Emergency Medicine    FIRST PROVIDER TRIAGE NOTE             Independent MLP           12/05/20  10:14 AM EDT    Date of Encounter: 12/05/20   MRN: [id number]    Vitals:    12/05/20 1000 12/05/20 1015   BP:  117/80   Pulse: (!) 104 (!) 108   Resp:  20   SpO2: 96% 96%   Weight:  188 lb (85.3 kg)      HPI: Ray Rodgers is a 40 y.o. male who presents to the ED for Dizziness, Chest Pain, Emesis (Symptoms starting this morning; subjective fever), and Shortness of Breath (Intermittent; worse lying flat)  Reports dizziness and chills, chest pain, SOB, and cough   SOB is worse with lying flat     ROS: Negative for abd pain, vomiting, or diarrhea.    Physical Exam:   Gen Appearance/Constitutional: alert  CV: tachycardia     Initial Plan of Care: All treatment areas with department are currently occupied.     Plan to order/Initiate the following while awaiting opening in ED: labs, EKG, and imaging studies.    Initial Plan of Care: Initiate Treatment-Testing, Proceed toTreatment Area When Bed Available for ED Attending/MLP to Continue Care    Electronically signed by Lennox Solders, PA-C   DD: 12/05/20       Lennox Solders, PA-C  12/05/20 1016

## 2020-12-05 NOTE — ED Provider Notes (Signed)
ATTENDING PROVIDER ATTESTATION:     Ray Rodgers presented to the emergency department for evaluation of Dizziness, Chest Pain, Emesis (Symptoms starting this morning; subjective fever), Shortness of Breath (Intermittent; worse lying flat), and Cough (productive)   and was initially evaluated by the Medical Resident. See Original ED Note for H&P and ED course above.     I have reviewed and discussed the case, including pertinent history (medical, surgical, family and social) and exam findings with the Medical Resident assigned to [facility].  I have personally performed and/or participated in the history, exam, medical decision making, and procedures and agree with all pertinent clinical information.     I, Dr. Minus Breeding, MD, am the primary provider of this record.     I have reviewed my findings and recommendations with the assigned Medical Resident, Lanette Hampshire and members of family present at the time of disposition.    My findings/plan: The primary encounter diagnosis was Upper respiratory tract infection, unspecified type. Diagnoses of Exacerbation of asthma, unspecified asthma severity, unspecified whether persistent and AKI (acute kidney injury) (HCC) were also pertinent to this visit.  Discharge Medication List as of 12/05/2020  7:24 PM        Minus Breeding, MD      40 year old male with past medical history of asthma presents today with chest pain, body aches, congestion beginning today.  Symptoms have been moderate in severity, constant, nothing is made it better or worse, no associated symptoms.  He has not been using his asthma treatment as he has run out, the chest pain has been constant since this morning, midsternal, reproducible on palpation.  He does note that he has been coughing a lot more and more congested secondary to his allergies, he has not been having fevers or chills.  He has not been around any sick contacts.  No numbness, weakness, tingling.  No other acute  complaints today.         Review of Systems   Constitutional:  Positive for fatigue. Negative for chills and fever.   HENT:  Negative for ear pain, sinus pain and sore throat.    Eyes:  Negative for pain and redness.   Respiratory:  Positive for cough and shortness of breath. Negative for wheezing.    Cardiovascular:  Positive for chest pain.   Gastrointestinal:  Negative for abdominal pain, diarrhea, nausea and vomiting.   Genitourinary:  Negative for dysuria and flank pain.   Musculoskeletal:  Negative for back pain and neck pain.   Skin:  Negative for rash.   Neurological:  Negative for seizures and headaches.   Hematological:  Negative for adenopathy.   All other systems reviewed and are negative.     Physical Exam  Vitals and nursing note reviewed.   Constitutional:       General: He is not in acute distress.     Appearance: He is well-developed.   HENT:      Head: Normocephalic and atraumatic.      Right Ear: External ear normal.      Left Ear: External ear normal.      Mouth/Throat:      Mouth: Mucous membranes are moist.      Pharynx: Oropharynx is clear.   Eyes:      Pupils: Pupils are equal, round, and reactive to light.   Cardiovascular:      Rate and Rhythm: Normal rate and regular rhythm.      Heart sounds: Normal  heart sounds. No murmur heard.  Pulmonary:      Effort: Pulmonary effort is normal. No tachypnea or accessory muscle usage.      Breath sounds: Normal breath sounds.   Abdominal:      General: Bowel sounds are normal.      Palpations: Abdomen is soft.      Tenderness: There is no abdominal tenderness. There is no guarding or rebound.   Musculoskeletal:         General: No tenderness.      Cervical back: Normal range of motion and neck supple.      Right lower leg: No edema.      Left lower leg: No edema.   Skin:     General: Skin is warm and dry.   Neurological:      General: No focal deficit present.      Mental Status: He is alert and oriented to person, place, and time.        Procedures      EKG:  This EKG is signed and interpreted by me.    Rate: 100  Rhythm: Sinus  Interpretation: no acute changes, no ST elevations, intervals normal, normal axis  Comparison: stable as compared to patient's most recent EKG      MDM  Number of Diagnoses or Management Options  AKI (acute kidney injury) (HCC)  Exacerbation of asthma, unspecified asthma severity, unspecified whether persistent  Upper respiratory tract infection, unspecified type  Diagnosis management comments: 40 year old male past medical history of asthma presented with chest pain, body aches, congestion since today.  Arrives emergency from the scene and able exam.  EKG with no acute ST elevations or other rhythm changes, lab work with unremarkable delta troponin.  No other abnormalities noted.  Chest x-ray without acute process.  Patient symptomatically managed the emergency department he was feeling greatly improved.  Discharged with return precautions, Z-Pak, refill on his albuterol inhaler as well as a prednisone burst.  Encouraged to follow-up with primary care.  Verbalized understanding.       Amount and/or Complexity of Data Reviewed  Decide to obtain previous medical records or to obtain history from someone other than the patient: yes         ED Course as of 12/06/20 2023   [facility] Dec 05, 2020   1555 EKG:  This EKG is signed and interpreted by me.    Rate: 100  Rhythm: Sinus  Interpretation: Normal sinus rhythm, normal PR interval, normal QRS, normal axis, normal QT interval, TWI in III and aVF appear new  Comparison: changes compared to previous EKG   [JA]   1724 Patient is a 40 year old male presenting with cough beginning today. He states cough is productive of yellow sputum with associated pain in his chest when he coughs, shortness of breath, nasal congestion, and feeling hot and cold. He has a history of asthma but states he ran out of his inhaler a few days ago. He states his asthma is normal under good control, and he does not require  frequent hospital admissions ot intubation for his asthma in the past. He denies recent travel/immobilization, hemoptysis, syncope, hormone use, leg edema, calf tenderness, h/o DVT/PE, or cardiac history. He denies abdominal pain, emesis, and diarrhea.    On exam, patient is nontoxic. He has no nuchal rigidity or meningeal signs. No abdominal tenderness. He has some course breath sounds at lung bases with scattered wheezes but no respiratory distress. No leg edema or calf tenderness.  [  JA]      ED Course User Index  [JA] Minus Breeding, MD        ED Course as of 12/06/20 2023   Mon Dec 05, 2020   1555 EKG:  This EKG is signed and interpreted by me.    Rate: 100  Rhythm: Sinus  Interpretation: Normal sinus rhythm, normal PR interval, normal QRS, normal axis, normal QT interval, TWI in III and aVF appear new  Comparison: changes compared to previous EKG   [JA]   1724 Patient is a 40 year old male presenting with cough beginning today. He states cough is productive of yellow sputum with associated pain in his chest when he coughs, shortness of breath, nasal congestion, and feeling hot and cold. He has a history of asthma but states he ran out of his inhaler a few days ago. He states his asthma is normal under good control, and he does not require frequent hospital admissions ot intubation for his asthma in the past. He denies recent travel/immobilization, hemoptysis, syncope, hormone use, leg edema, calf tenderness, h/o DVT/PE, or cardiac history. He denies abdominal pain, emesis, and diarrhea.    On exam, patient is nontoxic. He has no nuchal rigidity or meningeal signs. No abdominal tenderness. He has some course breath sounds at lung bases with scattered wheezes but no respiratory distress. No leg edema or calf tenderness.  [JA]      ED Course User Index  [JA] Minus Breeding, MD       --------------------------------------------- PAST HISTORY ---------------------------------------------  Past Medical  History:  has a past medical history of Asthma.    Past Surgical History:  has no past surgical history on file.    Social History:  reports that he quit smoking about 3 years ago. His smoking use included cigarettes. He has never used smokeless tobacco. He reports that he does not drink alcohol and does not use drugs.    Family History: family history is not on file.     The patient???s home medications have been reviewed.    Allergies: Patient has no known allergies.    -------------------------------------------------- RESULTS -------------------------------------------------  Labs:  Results for orders placed or performed during the hospital encounter of 12/05/20   COVID-19, Rapid    Specimen: Nasopharyngeal Swab   Result Value Ref Range    SARS-CoV-2, NAAT Not Detected Not Detected   CBC with Auto Differential   Result Value Ref Range    WBC 7.6 4.5 - 11.5 E9/L    RBC 5.08 3.80 - 5.80 E12/L    Hemoglobin 14.2 12.5 - 16.5 g/dL    Hematocrit 56.2 13.0 - 54.0 %    MCV 86.0 80.0 - 99.9 fL    MCH 28.0 26.0 - 35.0 pg    MCHC 32.5 32.0 - 34.5 %    RDW 13.1 11.5 - 15.0 fL    Platelets 237 130 - 450 E9/L    MPV 9.7 7.0 - 12.0 fL    Neutrophils % 66.0 43.0 - 80.0 %    Immature Granulocytes % 0.3 0.0 - 5.0 %    Lymphocytes % 15.7 (L) 20.0 - 42.0 %    Monocytes % 9.6 2.0 - 12.0 %    Eosinophils % 7.3 (H) 0.0 - 6.0 %    Basophils % 1.1 0.0 - 2.0 %    Neutrophils Absolute 5.01 1.80 - 7.30 E9/L    Immature Granulocytes # 0.02 E9/L    Lymphocytes Absolute 1.19 (L) 1.50 - 4.00 E9/L  Monocytes Absolute 0.73 0.10 - 0.95 E9/L    Eosinophils Absolute 0.55 (H) 0.05 - 0.50 E9/L    Basophils Absolute 0.08 0.00 - 0.20 E9/L   Comprehensive Metabolic Panel w/ Reflex to MG   Result Value Ref Range    Sodium 143 132 - 146 mmol/L    Potassium reflex Magnesium 3.9 3.5 - 5.0 mmol/L    Chloride 109 (H) 98 - 107 mmol/L    CO2 23 22 - 29 mmol/L    Anion Gap 11 7 - 16 mmol/L    Glucose 96 74 - 99 mg/dL    BUN 16 6 - 20 mg/dL    Creatinine 1.3 (H)  0.7 - 1.2 mg/dL    GFR Non-African American >60 >=60 mL/min/1.73    GFR African American >60     Calcium 9.2 8.6 - 10.2 mg/dL    Total Protein 7.3 6.4 - 8.3 g/dL    Albumin 4.4 3.5 - 5.2 g/dL    Total Bilirubin 0.3 0.0 - 1.2 mg/dL    Alkaline Phosphatase 123 40 - 129 U/L    ALT 42 (H) 0 - 40 U/L    AST 32 0 - 39 U/L   Troponin   Result Value Ref Range    Troponin, High Sensitivity 17 (H) 0 - 11 ng/L   Brain Natriuretic Peptide   Result Value Ref Range    Pro-BNP 32 0 - 125 pg/mL   Troponin   Result Value Ref Range    Troponin, High Sensitivity 13 (H) 0 - 11 ng/L   D-Dimer, Quantitative   Result Value Ref Range    D-Dimer, Quant <200 ng/mL DDU   EKG 12 Lead   Result Value Ref Range    Ventricular Rate 100 BPM    Atrial Rate 100 BPM    P-R Interval 154 ms    QRS Duration 86 ms    Q-T Interval 338 ms    QTc Calculation (Bazett) 436 ms    P Axis 81 degrees    R Axis 44 degrees    T Axis -5 degrees       Radiology:  XR CHEST (2 VW)   Final Result   No acute process.             ------------------------- NURSING NOTES AND VITALS REVIEWED ---------------------------  Date / Time Roomed:  12/05/2020  3:41 PM  ED Bed Assignment:  34/34    The nursing notes within the ED encounter and vital signs as below have been reviewed.   BP 126/72    Pulse 96    Temp 98.6 ??F (37 ??C) (Oral)    Resp 18    Wt 188 lb (85.3 kg)    SpO2 97%    BMI 25.50 kg/m??   Oxygen Saturation Interpretation: Normal      ------------------------------------------ PROGRESS NOTES ------------------------------------------  I have spoken with the patient and discussed today???s results, in addition to providing specific details for the plan of care and counseling regarding the diagnosis and prognosis.  Their questions are answered at this time and they are agreeable with the plan. I discussed at length with them reasons for immediate return here for re evaluation. They will followup with primary care by calling their office  tomorrow.      --------------------------------- ADDITIONAL PROVIDER NOTES ---------------------------------  At this time the patient is without objective evidence of an acute process requiring hospitalization or inpatient management.  They have remained hemodynamically stable throughout their entire ED  visit and are stable for discharge with outpatient follow-up.     The plan has been discussed in detail and they are aware of the specific conditions for emergent return, as well as the importance of follow-up.      Discharge Medication List as of 12/05/2020  7:24 PM          Diagnosis:  1. Upper respiratory tract infection, unspecified type    2. Exacerbation of asthma, unspecified asthma severity, unspecified whether persistent    3. AKI (acute kidney injury) (HCC)        Disposition:  Patient's disposition: Discharge to home  Patient's condition is stable.            Doreene Adas, DO  Resident  12/06/20 3299       Minus Breeding, MD  12/06/20 2025

## 2020-12-26 ENCOUNTER — Inpatient Hospital Stay
Admit: 2020-12-26 | Discharge: 2020-12-26 | Disposition: A | Discharge status: Home / Self Care | Payer: PRIVATE HEALTH INSURANCE | Attending: Emergency Medicine

## 2020-12-26 ENCOUNTER — Emergency Department: Admit: 2020-12-26 | Payer: PRIVATE HEALTH INSURANCE

## 2020-12-26 DIAGNOSIS — J45901 Unspecified asthma with (acute) exacerbation: Secondary | ICD-10-CM

## 2020-12-26 LAB — CBC WITH AUTO DIFFERENTIAL
Basophils %: 1.2 % (ref 0.0–2.0)
Basophils Absolute: 0.07 E9/L (ref 0.00–0.20)
Eosinophils %: 8.8 % — ABNORMAL HIGH (ref 0.0–6.0)
Eosinophils Absolute: 0.51 E9/L — ABNORMAL HIGH (ref 0.05–0.50)
Hematocrit: 43.1 % (ref 37.0–54.0)
Hemoglobin: 14 g/dL (ref 12.5–16.5)
Immature Granulocytes #: 0.01 E9/L
Immature Granulocytes %: 0.2 % (ref 0.0–5.0)
Lymphocytes %: 29.4 % (ref 20.0–42.0)
Lymphocytes Absolute: 1.71 E9/L (ref 1.50–4.00)
MCH: 28 pg (ref 26.0–35.0)
MCHC: 32.5 % (ref 32.0–34.5)
MCV: 86.2 fL (ref 80.0–99.9)
MPV: 10.2 fL (ref 7.0–12.0)
Monocytes %: 8.6 % (ref 2.0–12.0)
Monocytes Absolute: 0.5 E9/L (ref 0.10–0.95)
Neutrophils %: 51.8 % (ref 43.0–80.0)
Neutrophils Absolute: 3.02 E9/L (ref 1.80–7.30)
Platelets: 194 E9/L (ref 130–450)
RBC: 5 E12/L (ref 3.80–5.80)
RDW: 13.2 fL (ref 11.5–15.0)
WBC: 5.8 E9/L (ref 4.5–11.5)

## 2020-12-26 LAB — COMPREHENSIVE METABOLIC PANEL
ALT: 24 U/L (ref 0–40)
AST: 33 U/L (ref 0–39)
Albumin: 4.3 g/dL (ref 3.5–5.2)
Alkaline Phosphatase: 111 U/L (ref 40–129)
Anion Gap: 10 mmol/L (ref 7–16)
BUN: 19 mg/dL (ref 6–20)
CO2: 24 mmol/L (ref 22–29)
Calcium: 8.9 mg/dL (ref 8.6–10.2)
Chloride: 110 mmol/L — ABNORMAL HIGH (ref 98–107)
Creatinine: 1.1 mg/dL (ref 0.7–1.2)
GFR African American: >60
GFR Non-African American: >60 mL/min/{1.73_m2} (ref >=60)
Glucose: 104 mg/dL — ABNORMAL HIGH (ref 74–99)
Potassium: 4.4 mmol/L (ref 3.5–5.0)
Sodium: 144 mmol/L (ref 132–146)
Total Bilirubin: <0.2 mg/dL (ref 0.0–1.2)
Total Protein: 6.8 g/dL (ref 6.4–8.3)

## 2020-12-26 LAB — MAGNESIUM: Magnesium: 2.1 mg/dL (ref 1.6–2.6)

## 2020-12-26 LAB — EKG 12-LEAD
Atrial Rate: 100 {beats}/min
P Axis: 80 degrees
P-R Interval: 166 ms
Q-T Interval: 330 ms
QRS Duration: 80 ms
QTc Calculation (Bazett): 425 ms
R Axis: 67 degrees
T Axis: 62 degrees
Ventricular Rate: 100 {beats}/min

## 2020-12-26 LAB — TROPONIN
Troponin, High Sensitivity: 11 ng/L (ref 0–11)
Troponin, High Sensitivity: 9 ng/L (ref 0–11)

## 2020-12-26 LAB — BRAIN NATRIURETIC PEPTIDE: Pro-BNP: 65 pg/mL (ref 0–125)

## 2020-12-26 MED ORDER — PREDNISONE 50 MG PO TABS
50 MG | ORAL_TABLET | Freq: Every day | ORAL | 0 refills | Status: AC
Start: 2020-12-26 — End: 2020-12-31

## 2020-12-26 MED ORDER — ALBUTEROL SULFATE (2.5 MG/3ML) 0.083% IN NEBU
Freq: Once | RESPIRATORY_TRACT | Status: AC
Start: 2020-12-26 — End: 2020-12-26
  Administered 2020-12-26: 13:00:00 2.5 mg via RESPIRATORY_TRACT

## 2020-12-26 MED ORDER — IPRATROPIUM-ALBUTEROL 0.5-2.5 (3) MG/3ML IN SOLN
Freq: Once | RESPIRATORY_TRACT | Status: AC
Start: 2020-12-26 — End: 2020-12-26
  Administered 2020-12-26: 12:00:00 3 via RESPIRATORY_TRACT

## 2020-12-26 MED ORDER — MAGNESIUM SULFATE 2000 MG/50 ML IVPB PREMIX
2 GM/50ML | Freq: Once | INTRAVENOUS | Status: AC
Start: 2020-12-26 — End: 2020-12-26
  Administered 2020-12-26: 12:00:00 2000 mg via INTRAVENOUS

## 2020-12-26 MED ORDER — ALBUTEROL SULFATE HFA 108 (90 BASE) MCG/ACT IN AERS
108 (90 Base) MCG/ACT | RESPIRATORY_TRACT | 1 refills | Status: AC | PRN
Start: 2020-12-26 — End: 2021-12-26

## 2020-12-26 MED ORDER — METHYLPREDNISOLONE SODIUM SUCC 125 MG IJ SOLR
125 MG | Freq: Once | INTRAMUSCULAR | Status: AC
Start: 2020-12-26 — End: 2020-12-26
  Administered 2020-12-26: 12:00:00 125 mg via INTRAVENOUS

## 2020-12-26 MED FILL — SOLU-MEDROL 125 MG IJ SOLR: 125 mg | INTRAMUSCULAR | Qty: 125

## 2020-12-26 MED FILL — IPRATROPIUM-ALBUTEROL 0.5-2.5 (3) MG/3ML IN SOLN: 0.5-2.5 (3) MG/3ML | RESPIRATORY_TRACT | Qty: 9

## 2020-12-26 MED FILL — MAGNESIUM SULFATE 2 GM/50ML IV SOLN: 2 GM/50ML | INTRAVENOUS | Qty: 50

## 2020-12-26 NOTE — ED Provider Notes (Signed)
Chief Complaint   Ray Rodgers presents with    Shortness of Breath     Pt hx of asthma, worse since middle of the night        HPI   Ray Rodgers is a 40 y.o. old male with a past medical history of asthma presenting to the emergency department with complaint of shortness of breath that started in the middle of last night.  Ray Rodgers states he has been having an upper respiratory infection for the past week with rhinorrhea, a productive cough, and fatigue.  His shortness of breath is moderate in severity, better with albuterol inhaler, and worse by nothing.  He complains of chest tightness  that is worse with cough.  He did not take anything for pain.  He denies fever, chills, nausea, vomiting, abdominal pain, diarrhea, constipation, leg swelling, or dysuria.  Ray Rodgers states he recently quit smoking about 3 months ago.  He denies any history of blood clots.    Review of Systems   Constitutional:  Negative for chills and fever.   HENT:  Negative for ear pain and sore throat.    Eyes:  Negative for pain.   Respiratory:  Positive for cough, chest tightness and shortness of breath.    Cardiovascular:  Negative for chest pain.   Gastrointestinal:  Negative for abdominal pain, diarrhea, nausea and vomiting.   Genitourinary:  Negative for flank pain.   Musculoskeletal:  Positive for back pain. Negative for neck pain.   Skin:  Negative for rash.   Neurological:  Negative for headaches.   Psychiatric/Behavioral:  Negative for behavioral problems. The Ray Rodgers is not nervous/anxious.       Physical Exam  Constitutional:       General: He is in acute distress.      Appearance: Normal appearance. He is well-developed.   HENT:      Head: Normocephalic and atraumatic.      Right Ear: External ear normal.      Left Ear: External ear normal.      Nose: Nose normal.      Mouth/Throat:      Mouth: Mucous membranes are moist.      Pharynx: No oropharyngeal exudate.   Eyes:      Extraocular Movements: Extraocular movements intact.       Conjunctiva/sclera: Conjunctivae normal.      Pupils: Pupils are equal, round, and reactive to light.   Cardiovascular:      Rate and Rhythm: Normal rate and regular rhythm.      Pulses: Normal pulses.      Heart sounds: Normal heart sounds.   Pulmonary:      Effort: Tachypnea, accessory muscle usage and respiratory distress present.      Breath sounds: Decreased breath sounds and wheezing present. No rhonchi or rales.   Chest:      Chest wall: Tenderness present.   Abdominal:      Tenderness: There is no abdominal tenderness. There is no guarding or rebound.   Musculoskeletal:         General: No deformity or signs of injury.      Cervical back: Normal range of motion and neck supple.      Right lower leg: No edema.      Left lower leg: No edema.   Skin:     General: Skin is warm and dry.      Capillary Refill: Capillary refill takes less than 2 seconds.   Neurological:  General: No focal deficit present.      Mental Status: He is alert. Mental status is at baseline.   Psychiatric:         Mood and Affect: Mood normal.        Procedures     MDM     Amount and/or Complexity of Data Reviewed  Clinical lab tests: reviewed  Tests in the radiology section of CPT??: reviewed  Decide to obtain previous medical records or to obtain history from someone other than the Ray Rodgers: yes    This is a 40 year old male with a history of asthma presenting for shortness of breath.  Ray Rodgers was satting 94% on room air on arrival.  He was seen in moderate respiratory distress with wheezing on auscultation Ray Rodgers was placed on the monitor and given duo nebs, magnesium, and Solu-Medrol.  Cardiac work-up was initiated given his chest tightness.  Chest x-ray showed no acute process.  His EKG showed no STEMI or ischemic changes.  Troponin was initially 11 with repeat of 9.  Lab work showed no significant normalities.  On reevaluation Ray Rodgers appeared well improved with no auditory wheezing.  He was agreeable with being discharged home  with a short course of steroids and a refill for his inhaler.  He was given strict return precautions.        ED Course as of 12/26/20 1107   Mon Dec 26, 2020   3716 EKG read interpreted by me.  Rate 100, normal sinus rhythm, normal axis, QTC 425, no STEMI, baseline artifact, stable compared to previous EKG. [TO]   [id number] Ray Rodgers reevaluated after his breathing treatments.  He appears in no respiratory distress at this time.  Ray Rodgers noted to have a mild bilateral wheezing on expiration.  He reports improvement in his symptoms. [TO]   1029 Repeat troponin downtrending. [TO]   1053 On reevaluation Ray Rodgers seen in no respiratory distress.  Bilateral lungs are clear to auscultation.  He is agreeable with being discharged home with refill for his inhaler and a steroid course.  Return precautions were discussed. [TO]      ED Course User Index  [TO] Marianna Fuss, DO       --------------------------------------------- PAST HISTORY ---------------------------------------------  Past Medical History:  has a past medical history of Asthma.    Past Surgical History:  has no past surgical history on file.    Social History:  reports that he quit smoking about 3 years ago. His smoking use included cigarettes. He has never used smokeless tobacco. He reports that he does not drink alcohol and does not use drugs.    Family History: family history is not on file.     The Ray Rodgers???s home medications have been reviewed.    Allergies: Ray Rodgers has no known allergies.    -------------------------------------------------- RESULTS -------------------------------------------------  Labs:  Results for orders placed or performed during the hospital encounter of 12/26/20   CBC with Auto Differential   Result Value Ref Range    WBC 5.8 4.5 - 11.5 E9/L    RBC 5.00 3.80 - 5.80 E12/L    Hemoglobin 14.0 12.5 - 16.5 g/dL    Hematocrit 96.7 89.3 - 54.0 %    MCV 86.2 80.0 - 99.9 fL    MCH 28.0 26.0 - 35.0 pg    MCHC 32.5 32.0 - 34.5 %    RDW 13.2 11.5 -  15.0 fL    Platelets 194 130 - 450 E9/L    MPV 10.2 7.0 -  12.0 fL    Neutrophils % 51.8 43.0 - 80.0 %    Immature Granulocytes % 0.2 0.0 - 5.0 %    Lymphocytes % 29.4 20.0 - 42.0 %    Monocytes % 8.6 2.0 - 12.0 %    Eosinophils % 8.8 (H) 0.0 - 6.0 %    Basophils % 1.2 0.0 - 2.0 %    Neutrophils Absolute 3.02 1.80 - 7.30 E9/L    Immature Granulocytes # 0.01 E9/L    Lymphocytes Absolute 1.71 1.50 - 4.00 E9/L    Monocytes Absolute 0.50 0.10 - 0.95 E9/L    Eosinophils Absolute 0.51 (H) 0.05 - 0.50 E9/L    Basophils Absolute 0.07 0.00 - 0.20 E9/L   Troponin   Result Value Ref Range    Troponin, High Sensitivity 11 0 - 11 ng/L   Brain Natriuretic Peptide   Result Value Ref Range    Pro-BNP 65 0 - 125 pg/mL   Comprehensive Metabolic Panel   Result Value Ref Range    Sodium 144 132 - 146 mmol/L    Potassium 4.4 3.5 - 5.0 mmol/L    Chloride 110 (H) 98 - 107 mmol/L    CO2 24 22 - 29 mmol/L    Anion Gap 10 7 - 16 mmol/L    Glucose 104 (H) 74 - 99 mg/dL    BUN 19 6 - 20 mg/dL    Creatinine 1.1 0.7 - 1.2 mg/dL    GFR Non-African American >60 >=60 mL/min/1.73    GFR African American >60     Calcium 8.9 8.6 - 10.2 mg/dL    Total Protein 6.8 6.4 - 8.3 g/dL    Albumin 4.3 3.5 - 5.2 g/dL    Total Bilirubin <4.4 0.0 - 1.2 mg/dL    Alkaline Phosphatase 111 40 - 129 U/L    ALT 24 0 - 40 U/L    AST 33 0 - 39 U/L   Magnesium   Result Value Ref Range    Magnesium 2.1 1.6 - 2.6 mg/dL   Troponin   Result Value Ref Range    Troponin, High Sensitivity 9 0 - 11 ng/L       Radiology:  XR CHEST PORTABLE   Final Result   No acute process.             ------------------------- NURSING NOTES AND VITALS REVIEWED ---------------------------  Date / Time Roomed:  12/26/2020  7:38 AM  ED Bed Assignment:  18B/18B-18    The nursing notes within the ED encounter and vital signs as below have been reviewed.   BP 104/72    Pulse 90    Temp 98 ??F (36.7 ??C) (Oral)    Resp 18    Ht 6' (1.829 m)    Wt 187 lb (84.8 kg)    SpO2 94%    BMI 25.36 kg/m??   Oxygen  Saturation Interpretation: Normal      ------------------------------------------ PROGRESS NOTES ------------------------------------------  11:06 AM EDT  I have spoken with the Ray Rodgers and/or Family and discussed today???s results, in addition to providing specific details for the plan of care and counseling regarding the diagnosis and prognosis.   Labs and Imaging discussed with Ray Rodgers including appropriate follow- up and re-evaluation. Their questions are answered at this time and they are agreeable with the plan. I discussed at length with them reasons for immediate return here for re evaluation. They will followup with PCP by calling their office Next Business Day      ---------------------------------  ADDITIONAL PROVIDER NOTES ---------------------------------  At this time the Ray Rodgers is without objective evidence of an acute process requiring hospitalization or inpatient management.  They have remained hemodynamically stable throughout their entire ED visit and are stable for discharge with outpatient follow-up.     The plan has been discussed in detail and they are aware of the specific conditions for emergent return, as well as the importance of follow-up.      New Prescriptions    PREDNISONE (DELTASONE) 50 MG TABLET    Take 1 tablet by mouth daily for 5 days       Diagnosis:  1. Asthma with acute exacerbation, unspecified asthma severity, unspecified whether persistent    2. Exacerbation of asthma, unspecified asthma severity, unspecified whether persistent        Disposition:  Ray Rodgers's disposition: Discharge to home  Ray Rodgers's condition is stable.         Marianna Fuss, DO  Resident  12/26/20 [phone]

## 2020-12-26 NOTE — ED Provider Notes (Signed)
ATTENDING PROVIDER ATTESTATION:     Ray Rodgers presented to the emergency department for evaluation of Shortness of Breath (Pt hx of asthma, worse since middle of the night )   and was initially evaluated by the Medical Resident. See Original ED Note for H&P and ED course above.     I have reviewed and discussed the case, including pertinent history (medical, surgical, family and social) and exam findings with the Medical Resident assigned to [facility].  I have personally performed and/or participated in the history, exam, medical decision making, and procedures and agree with all pertinent clinical information and any additional changes or corrections are noted below in my assessment and plan. I have discussed this patient in detail with the resident, and provided the instruction and education,       I have reviewed my findings and recommendations with the assigned Medical Resident, Ray Rodgers and members of family present at the time of disposition.      I have performed a history and physical examination of this patient and directly supervised the resident caring for this patient      History of Present Illness:    Presents to the ED for shortness of breath, beginning prior to arrival.  The complaint has been constant, severe in severity, and worsened by nothing.  Arrives with shortness of breath. Onset last night. Hx of asthma. Reports severe wheezing. Worse this morning. No fever or chills. Reports chest feels like a tight wheeze. No sharp or stabbing pain. No pain with breathing.       Review of Systems:   A complete review of systems was performed and pertinent positives and negatives are stated within HPI, all other systems reviewed and are negative.    --------------------------------------------- PAST HISTORY ---------------------------------------------  Past Medical History:  has a past medical history of Asthma.    Past Surgical History: denies    Social History:  reports that he quit  smoking about 3 years ago. His smoking use included cigarettes. He has never used smokeless tobacco. He reports that he does not drink alcohol and does not use drugs.    Family History: family history is not on file.  Unless otherwise noted, family history is non contributory    The patient???s home medications have been reviewed.    Allergies: Patient has no known allergies.    EKG Interpretation  Interpreted by emergency department physician, Dr. Rexanne Rodgers     12/26/20  Time: 0749    Rhythm: normal sinus   Rate: normal  Axis: normal  Conduction: normal  ST Segments: no acute change  T Waves: no acute change    Clinical Impression: Sinus rhythm, no acute ischemic changes    Comparison to Old EKG  Stable from prior EKG    Physical Exam:  Constitutional/General: Alert and oriented x3  Head: Normocephalic and atraumatic  Eyes: PERRL, EOMI, sclera non icteric  ENT: Oropharynx clear, handling secretions  Neck: Supple, full ROM, no stridor, no meningeal signs  Respiratory: Lungs with diffuse wheezing bilaterally. Tachypnea, in respiratory distress  Cardiovascular:  tachycardic but Regular rhythm. No murmurs, no gallops, no rubs. 2+ distal pulses. Equal extremity pulses.   GI:  Abdomen Soft, Non tender, Non distended. No rebound, guarding, or rigidity. No pulsatile masses.  Musculoskeletal: Moves all extremities x 4. Warm and well perfused,  no clubbing, no cyanosis, no edema. Palpable peripheral pulses  Integument: skin warm and dry. No rashes.   Neurologic: GCS 15, no  focal deficits  Psychiatric: Normal Affect      I directly supervised any procedures performed by the resident and was present for the procedure including all critical portions of the procedure      The cardiac monitor revealed sinus rhythm with a heart rate in the 100s as interpreted by me. The cardiac monitor was ordered secondary to the patient's shortness of breath and to monitor the patient for dysrhythmia.   CPT [id number]      I, Dr. Rexanne Rodgers, am the primary  provider of record    My Medical Decision Making:     Acute asthma exacerbation, arrives in respiratory distress.  Wheezing, tachypnea. Nebs, steroids, mag, additional nebs, finally breathing better and moving more air. Sx improved. Appropriate for dc home and outpatient therapy    8:30 AM EDT  Improving with breathing treatments, moving more air, work of breathing improving    Breathing better      CRITICAL CARE:  Please note that the withdrawal or failure to initiate urgent interventions for this patient would likely result in a life threatening deterioration or permanent disability.      Accordingly this patient received 30 minutes of critical care time, including coordination of care, and direct bedside care and excluding separately billable procedures.      1. Asthma with acute exacerbation, unspecified asthma severity, unspecified whether persistent           [place] W Bobak Oguinn, MD  12/26/20 1051

## 2021-01-11 ENCOUNTER — Inpatient Hospital Stay
Admit: 2021-01-11 | Discharge: 2021-01-11 | Disposition: A | Discharge status: Home / Self Care | Payer: PRIVATE HEALTH INSURANCE | Attending: Emergency Medicine

## 2021-01-11 ENCOUNTER — Emergency Department: Admit: 2021-01-11 | Payer: PRIVATE HEALTH INSURANCE

## 2021-01-11 DIAGNOSIS — J4521 Mild intermittent asthma with (acute) exacerbation: Secondary | ICD-10-CM

## 2021-01-11 LAB — EKG 12-LEAD
Atrial Rate: 79 {beats}/min
P Axis: 67 degrees
P-R Interval: 154 ms
Q-T Interval: 360 ms
QRS Duration: 88 ms
QTc Calculation (Bazett): 412 ms
R Axis: 15 degrees
T Axis: 43 degrees
Ventricular Rate: 79 {beats}/min

## 2021-01-11 LAB — CBC WITH AUTO DIFFERENTIAL
Basophils %: 1.5 % (ref 0.0–2.0)
Basophils Absolute: 0.1 E9/L (ref 0.00–0.20)
Eosinophils %: 5.8 % (ref 0.0–6.0)
Eosinophils Absolute: 0.4 E9/L (ref 0.05–0.50)
Hematocrit: 44.9 % (ref 37.0–54.0)
Hemoglobin: 14.7 g/dL (ref 12.5–16.5)
Immature Granulocytes #: 0.02 E9/L
Immature Granulocytes %: 0.3 % (ref 0.0–5.0)
Lymphocytes %: 23.9 % (ref 20.0–42.0)
Lymphocytes Absolute: 1.64 E9/L (ref 1.50–4.00)
MCH: 27.8 pg (ref 26.0–35.0)
MCHC: 32.7 % (ref 32.0–34.5)
MCV: 84.9 fL (ref 80.0–99.9)
MPV: 9.9 fL (ref 7.0–12.0)
Monocytes %: 7.3 % (ref 2.0–12.0)
Monocytes Absolute: 0.5 E9/L (ref 0.10–0.95)
Neutrophils %: 61.2 % (ref 43.0–80.0)
Neutrophils Absolute: 4.21 E9/L (ref 1.80–7.30)
Platelets: 201 E9/L (ref 130–450)
RBC: 5.29 E12/L (ref 3.80–5.80)
RDW: 13.2 fL (ref 11.5–15.0)
WBC: 6.9 E9/L (ref 4.5–11.5)

## 2021-01-11 LAB — BASIC METABOLIC PANEL W/ REFLEX TO MG FOR LOW K
Anion Gap: 11 mmol/L (ref 7–16)
BUN: 19 mg/dL (ref 6–20)
CO2: 24 mmol/L (ref 22–29)
Calcium: 9.9 mg/dL (ref 8.6–10.2)
Chloride: 105 mmol/L (ref 98–107)
Creatinine: 1.1 mg/dL (ref 0.7–1.2)
GFR African American: >60
GFR Non-African American: >60 mL/min/{1.73_m2} (ref >=60)
Glucose: 97 mg/dL (ref 74–99)
Potassium reflex Magnesium: 4.2 mmol/L (ref 3.5–5.0)
Sodium: 140 mmol/L (ref 132–146)

## 2021-01-11 LAB — TROPONIN
Troponin, High Sensitivity: 7 ng/L (ref 0–11)
Troponin, High Sensitivity: 6 ng/L (ref 0–11)

## 2021-01-11 LAB — D-DIMER, QUANTITATIVE: D-Dimer, Quant: <200 ng/mL DDU

## 2021-01-11 MED ORDER — ALBUTEROL SULFATE HFA 108 (90 BASE) MCG/ACT IN AERS
108 (90 Base) MCG/ACT | Freq: Once | RESPIRATORY_TRACT | Status: DC
Start: 2021-01-11 — End: 2021-01-11

## 2021-01-11 MED ORDER — IPRATROPIUM-ALBUTEROL 0.5-2.5 (3) MG/3ML IN SOLN
Freq: Once | RESPIRATORY_TRACT | Status: AC
Start: 2021-01-11 — End: 2021-01-11
  Administered 2021-01-11: 18:00:00 1 via RESPIRATORY_TRACT

## 2021-01-11 MED ORDER — ALBUTEROL SULFATE HFA 108 (90 BASE) MCG/ACT IN AERS
108 (90 Base) MCG/ACT | Freq: Once | RESPIRATORY_TRACT | Status: AC
Start: 2021-01-11 — End: 2021-01-11
  Administered 2021-01-11: 18:00:00 2 via RESPIRATORY_TRACT

## 2021-01-11 MED ORDER — ALBUTEROL SULFATE HFA 108 (90 BASE) MCG/ACT IN AERS
108 (90 Base) MCG/ACT | Freq: Four times a day (QID) | RESPIRATORY_TRACT | 0 refills | Status: DC | PRN
Start: 2021-01-11 — End: 2021-02-12

## 2021-01-11 MED ORDER — METHYLPREDNISOLONE SODIUM SUCC 125 MG IJ SOLR
125 MG | Freq: Once | INTRAMUSCULAR | Status: AC
Start: 2021-01-11 — End: 2021-01-11
  Administered 2021-01-11: 17:00:00 125 mg via INTRAVENOUS

## 2021-01-11 MED ORDER — ALBUTEROL SULFATE (2.5 MG/3ML) 0.083% IN NEBU
Freq: Once | RESPIRATORY_TRACT | Status: DC
Start: 2021-01-11 — End: 2021-01-11

## 2021-01-11 MED ORDER — IPRATROPIUM-ALBUTEROL 0.5-2.5 (3) MG/3ML IN SOLN
Freq: Once | RESPIRATORY_TRACT | Status: AC
Start: 2021-01-11 — End: 2021-01-11
  Administered 2021-01-11: 17:00:00 1 via RESPIRATORY_TRACT

## 2021-01-11 MED ORDER — PREDNISONE 20 MG PO TABS
20 MG | ORAL_TABLET | Freq: Every day | ORAL | 0 refills | Status: AC
Start: 2021-01-11 — End: 2021-01-16

## 2021-01-11 MED FILL — ALBUTEROL SULFATE (2.5 MG/3ML) 0.083% IN NEBU: RESPIRATORY_TRACT | Qty: 3

## 2021-01-11 MED FILL — SOLU-MEDROL 125 MG IJ SOLR: 125 MG | INTRAMUSCULAR | Qty: 125

## 2021-01-11 MED FILL — ALBUTEROL SULFATE HFA 108 (90 BASE) MCG/ACT IN AERS: 108 (90 Base) MCG/ACT | RESPIRATORY_TRACT | Qty: 8.5

## 2021-01-11 MED FILL — IPRATROPIUM-ALBUTEROL 0.5-2.5 (3) MG/3ML IN SOLN: RESPIRATORY_TRACT | Qty: 3

## 2021-01-11 NOTE — ED Provider Notes (Signed)
[facility]  Department of Emergency Medicine     Written by: Hyman Bower, DO  Patient Name: Ray Rodgers  Attending Provider: [place] W Donley, MD  Admit Date: 01/11/2021 11:29 AM  MRN: [id number]                   DOB: Jul 03, 1980        Chief Complaint   Patient presents with    Shortness of Breath     Pt started having an asthma attack and didn't have his rescue inhaler. Pt called 911 due to being severely short of breath. -CP    - Chief complaint    Patient is a 40 year old male past medical history of asthma.  Patient presents with chief complaint of cough and shortness of breath.  Patient states that for the last few days he has had gradually worsening cough as well as shortness of breath.  Patient states he does have a history of asthma but has been out of his inhalers.  Patient stated symptoms have been moderate in severity and constant onset.  Notes symptoms worsen with exertion he denies any relieving factors.  Patient notes that he has had similar episodes in the past.  Patient states that shortness breath worsened this morning as he called 911.  Patient does also endorse mild chest pain he describes as a tightness sensation.  Patient denies any fevers, chills, nausea, vomiting, abdominal pain, constipation or diarrhea.    The history is provided by the patient. No language interpreter was used.      Review of Systems   Constitutional:  Negative for chills and fever.   HENT:  Negative for ear pain, sinus pressure and sore throat.    Eyes:  Negative for pain, discharge and redness.   Respiratory:  Positive for shortness of breath. Negative for cough and wheezing.    Cardiovascular:  Positive for chest pain.   Gastrointestinal:  Negative for abdominal pain, diarrhea, nausea and vomiting.   Genitourinary:  Negative for dysuria and frequency.   Musculoskeletal:  Negative for arthralgias and back pain.   Skin:  Negative for rash and wound.   Neurological:  Negative for weakness and  headaches.   Hematological:  Negative for adenopathy.   All other systems reviewed and are negative.     Physical Exam  Vitals and nursing note reviewed.   Constitutional:       Appearance: He is well-developed.   HENT:      Head: Normocephalic and atraumatic.   Eyes:      Conjunctiva/sclera: Conjunctivae normal.   Cardiovascular:      Rate and Rhythm: Normal rate and regular rhythm.      Heart sounds: Normal heart sounds. No murmur heard.  Pulmonary:      Effort: Pulmonary effort is normal. No respiratory distress.      Breath sounds: Normal breath sounds. No wheezing or rales.   Abdominal:      General: Bowel sounds are normal.      Palpations: Abdomen is soft.      Tenderness: There is no abdominal tenderness. There is no guarding or rebound.   Musculoskeletal:         General: No tenderness or deformity.      Cervical back: Normal range of motion and neck supple.   Skin:     General: Skin is warm and dry.   Neurological:      Mental Status: He is alert and oriented to  person, place, and time.      Cranial Nerves: No cranial nerve deficit.      Coordination: Coordination normal.        Procedures   EKG #1:  Interpreted by emergency department physician unless otherwise noted.  Time:  1247    Rate: 79  Rhythm: Sinus.  Interpretation: EKG obtained and showed normal sinus him, rate 79, normal axis, QTC 412, no acute ST segment changes.  Comparison: stable as compared to patient's most recent EKG.      MDM  Number of Diagnoses or Management Options  Mild intermittent asthma with exacerbation  Diagnosis management comments: Patient is a 40 year old male past medical history of asthma.  Patient presents with chief complaint of cough and shortness of breath.  Vital signs stable presentation.  Physical exam heart regular in rhythm, lungs clear to auscultation bilaterally, abdomen soft nontender no rebound or guarding.  EKG obtained demonstrate no acute ischemic changes.  Laboratory work obtained CBC demonstrated no acute  maladies, BMP demonstrate no acute maladies, troponin was obtained and was less than 6x2, D-dimer less than 200.  Patient given albuterol as well as Solu-Medrol with significant provement symptoms.  Chest x-ray obtained demonstrated no acute abnormalities.  Findings consistent mild intermittent asthma exacerbation.  Patient is overall well-appearing he is not hypoxic he is not tachycardic laboratory work and EKG reassuring.  Decision made to discharge patient.  Patient given prescription for prednisone as well as albuterol inhaler.  Patient also instructed to contact number provided in discharge instructions for PCP referral.  Patient notes any new worrisome symptoms she was instructed to return to the emergency department for evaluation.  Plan of care discussed with patient including discharge, all questions were answered, patient was agreement with plan of care and discharged home in stable condition.       Amount and/or Complexity of Data Reviewed  Clinical lab tests: ordered and reviewed  Tests in the radiology section of CPT??: ordered and reviewed    Risk of Complications, Morbidity, and/or Mortality  Presenting problems: moderate  Diagnostic procedures: moderate  Management options: moderate    Patient Progress  Patient progress: stable           --------------------------------------------- PAST HISTORY ---------------------------------------------  Past Medical History:  has a past medical history of Asthma.    Past Surgical History:  has no past surgical history on file.    Social History:  reports that he quit smoking about 3 years ago. His smoking use included cigarettes. He has never used smokeless tobacco. He reports that he does not drink alcohol and does not use drugs.    Family History: family history is not on file.     The patient???s home medications have been reviewed.    Allergies: Patient has no known allergies.    -------------------------------------------------- RESULTS  -------------------------------------------------  Labs:  Results for orders placed or performed during the hospital encounter of 01/11/21   CBC with Auto Differential   Result Value Ref Range    WBC 6.9 4.5 - 11.5 E9/L    RBC 5.29 3.80 - 5.80 E12/L    Hemoglobin 14.7 12.5 - 16.5 g/dL    Hematocrit 35.5 73.2 - 54.0 %    MCV 84.9 80.0 - 99.9 fL    MCH 27.8 26.0 - 35.0 pg    MCHC 32.7 32.0 - 34.5 %    RDW 13.2 11.5 - 15.0 fL    Platelets 201 130 - 450 E9/L    MPV 9.9  7.0 - 12.0 fL    Neutrophils % 61.2 43.0 - 80.0 %    Immature Granulocytes % 0.3 0.0 - 5.0 %    Lymphocytes % 23.9 20.0 - 42.0 %    Monocytes % 7.3 2.0 - 12.0 %    Eosinophils % 5.8 0.0 - 6.0 %    Basophils % 1.5 0.0 - 2.0 %    Neutrophils Absolute 4.21 1.80 - 7.30 E9/L    Immature Granulocytes # 0.02 E9/L    Lymphocytes Absolute 1.64 1.50 - 4.00 E9/L    Monocytes Absolute 0.50 0.10 - 0.95 E9/L    Eosinophils Absolute 0.40 0.05 - 0.50 E9/L    Basophils Absolute 0.10 0.00 - 0.20 E9/L   Basic Metabolic Panel w/ Reflex to MG   Result Value Ref Range    Sodium 140 132 - 146 mmol/L    Potassium reflex Magnesium 4.2 3.5 - 5.0 mmol/L    Chloride 105 98 - 107 mmol/L    CO2 24 22 - 29 mmol/L    Anion Gap 11 7 - 16 mmol/L    Glucose 97 74 - 99 mg/dL    BUN 19 6 - 20 mg/dL    Creatinine 1.1 0.7 - 1.2 mg/dL    GFR Non-African American >60 >=60 mL/min/1.73    GFR African American >60     Calcium 9.9 8.6 - 10.2 mg/dL   Troponin   Result Value Ref Range    Troponin, High Sensitivity 7 0 - 11 ng/L   D-Dimer, Quantitative   Result Value Ref Range    D-Dimer, Quant <200 ng/mL DDU   Troponin   Result Value Ref Range    Troponin, High Sensitivity 6 0 - 11 ng/L   EKG 12 Lead   Result Value Ref Range    Ventricular Rate 79 BPM    Atrial Rate 79 BPM    P-R Interval 154 ms    QRS Duration 88 ms    Q-T Interval 360 ms    QTc Calculation (Bazett) 412 ms    P Axis 67 degrees    R Axis 15 degrees    T Axis 43 degrees       Radiology:  XR CHEST PORTABLE   Final Result   No acute  process.             ------------------------- NURSING NOTES AND VITALS REVIEWED ---------------------------  Date / Time Roomed:  01/11/2021 11:29 AM  ED Bed Assignment:  33/33    The nursing notes within the ED encounter and vital signs as below have been reviewed.   BP 121/71    Pulse 64    Temp 98.5 ??F (36.9 ??C) (Oral)    Resp 15    SpO2 97%   Oxygen Saturation Interpretation: Normal      ------------------------------------------ PROGRESS NOTES ------------------------------------------  3:02 PM EDT  I have spoken with the patient and discussed today???s results, in addition to providing specific details for the plan of care and counseling regarding the diagnosis and prognosis.  Their questions are answered at this time and they are agreeable with the plan. I discussed at length with them reasons for immediate return here for re evaluation. They will followup with their primary care physician by calling their office tomorrow.      --------------------------------- ADDITIONAL PROVIDER NOTES ---------------------------------  At this time the patient is without objective evidence of an acute process requiring hospitalization or inpatient management.  They have remained hemodynamically stable throughout their  entire ED visit and are stable for discharge with outpatient follow-up.     The plan has been discussed in detail and they are aware of the specific conditions for emergent return, as well as the importance of follow-up.      Discharge Medication List as of 01/11/2021  2:53 PM        START taking these medications    Details   predniSONE (DELTASONE) 20 MG tablet Take 3 tablets by mouth daily for 5 days, Disp-15 tablet, R-0Print      !! albuterol sulfate HFA (VENTOLIN HFA) 108 (90 Base) MCG/ACT inhaler Inhale 2 puffs into the lungs 4 times daily as needed for Wheezing, Disp-18 g, R-0Print       !! - Potential duplicate medications found. Please discuss with provider.          Diagnosis:  1. Mild intermittent  asthma with exacerbation        Disposition:  Patient's disposition: Discharge to home  Patient's condition is stable.      Patient was seen and evaluated by myself and my attending [place] W Donley, MD. Assessment and Plan discussed with attending provider, please see attestation for final plan of care.     Hyman Bower, DO        Soyla Dryer, DO  Resident  01/11/21 [phone]

## 2021-01-11 NOTE — Discharge Instructions (Signed)
Take all medication as prescribed  Intact above number for primary care provider  If you notice any new worrisome symptoms he is return to emergency department for evaluation

## 2021-01-11 NOTE — ED Notes (Signed)
Writer mistakenly signed off on patient's DUONEB as given. Respiratory inquired on the issue, and was informed that the patient wasn't administered the medication. Notified Respiratory that patient was awaiting an "rescue" inhaler to take home. Respiratory informed Clinical research associate that they would consult provider.       Jabier Gauss, RN  01/11/21 1345

## 2021-01-11 NOTE — ED Notes (Signed)
The following labs were labeled with appropriate pt sticker and tubed to lab:     []  Blue     []  Lavender   []  on ice  [x]  Green/yellow  []  Green/black []  on ice  []  Grey  []  on ice  []  Yellow  []  Red  []  Pink  []  ABG  []  VBG    []  COVID-19 swab    []  Rapid  []  PCR  []  Flu swab  []  Peds Viral Panel     []  Urine Sample  []  Pelvic Cultures  []  Blood Cultures  []  X 2  []  STREP Cultures         , RN  01/11/21 1400

## 2021-01-11 NOTE — ED Provider Notes (Signed)
ATTENDING PROVIDER ATTESTATION:     Ray Rodgers presented to the emergency department for evaluation of Shortness of Breath (Pt started having an asthma attack and didn't have his rescue inhaler. Pt called 911 due to being severely short of breath. -CP)   and was initially evaluated by the Medical Resident. See Original ED Note for H&P and ED course above.     I have reviewed and discussed the case, including pertinent history (medical, surgical, family and social) and exam findings with the Medical Resident assigned to [facility].  I have personally performed and/or participated in the history, exam, medical decision making, and procedures and agree with all pertinent clinical information and any additional changes or corrections are noted below in my assessment and plan. I have discussed this patient in detail with the resident, and provided the instruction and education,       I have reviewed my findings and recommendations with the assigned Medical Resident, Ray Rodgers and members of family present at the time of disposition.      I have performed a history and physical examination of this patient and directly supervised the resident caring for this patient      History of Present Illness:    Presents to the ED for shortness of breath and asthma exacerbation, beginning prior to arrival.  The complaint has been constant, severe in severity, and worsened by nothing.  Hx of asthma, says he started with an exacerbation this morning. Reports his lungs are tight like he is not moving enough air. No true chest pain. Denies back pain. No orthopnea. No fever or chills. No recent illness. Denies abdominal pain. Says he received nebs by EMS and is improving and is already feeling better.       Review of Systems:   A complete review of systems was performed and pertinent positives and negatives are stated within HPI, all other systems reviewed and are negative.    ---------------------------------------------  PAST HISTORY ---------------------------------------------  Past Medical History:  has a past medical history of Asthma.    Past Surgical History: denies    Social History:  reports that he quit smoking about 3 years ago. His smoking use included cigarettes. He has never used smokeless tobacco. He reports that he does not drink alcohol and does not use drugs.    Family History: family history is not on file.  Unless otherwise noted, family history is non contributory    The patient???s home medications have been reviewed.    Allergies: Patient has no known allergies.    EKG Interpretation  Interpreted by emergency department physician, Dr. Rexanne Mano     01/11/21  Time: 1245    Rhythm: normal sinus   Rate: normal  Axis: normal  Conduction: normal  ST Segments: no acute change  T Waves: no acute change    Clinical Impression: Sinus rhythm, no acute ischemic changes    Comparison to Old EKG  stable from prior      Physical Exam:  Constitutional/General: Alert and oriented x3  Head: Normocephalic and atraumatic  Eyes: PERRL, EOMI, sclera non icteric  ENT: Oropharynx clear, handling secretions  Neck: Supple, full ROM, no stridor, no meningeal signs  Respiratory: Lungs with decreased BS bilaterally.  Not in respiratory distress  Cardiovascular:  Regular rate. Regular rhythm. No murmurs, no gallops, no rubs. 2+ distal pulses. Equal extremity pulses.   GI:  Abdomen Soft, Non tender, Non distended. No rebound, guarding, or rigidity. No pulsatile  masses.  Musculoskeletal: Moves all extremities x 4. Warm and well perfused,  no clubbing, no cyanosis, no edema. Palpable peripheral pulses. No calf tenderness or palpable cords  Integument: skin warm and dry. No rashes.   Neurologic: GCS 15, no focal deficits  Psychiatric: Normal Affect      I directly supervised any procedures performed by the resident and was present for the procedure including all critical portions of the procedure      I, Dr. Rexanne Mano, am the primary provider of  record    My Medical Decision Making:         Asthma exacerbation  Already improving with meds/nebs  He is feeling better  He would like to go home        1. Mild intermittent asthma with exacerbation            [place] W Sundeep Destin, MD  01/11/21 2251

## 2021-01-11 NOTE — ED Notes (Signed)
40 y/o  m to the ed with a c/c of acute onset SOB. Patient reports hx of asthma and states that this is his "typical" asthma attack. Patient states that the weather may have triggered his attack. Patient noted with albuterol Tx x 1 en route with EMS with reported relief.  Patient denies chest pain, sob or difficulty breathing. Patient denies ABD pain or n/v/d. Patient noted with + msp x 4, pupils PERRLA @ 3 and lung sounds that are noted with mild expiratory wheezes bilaterally. Patient placed on telemetry, BP and pulse ox. Vitals noted as recorded.  Call light placed within patient's reach, and bed in lowest position with side rails up x 2 for safety. Provider at the bedside for assessment.        Jabier Gauss, RN  01/11/21 1158

## 2021-01-11 NOTE — ED Notes (Signed)
Patient noted stable and presenting in no acute distress. Discharge instructions and medication list reviewed (as required) with patient. All questions and concerns were addressed at this time, and the patient expresses understanding at time of discharge. Patient released with vitals noted as recorded.          Jabier Gauss, RN  01/11/21 [phone]

## 2021-01-11 NOTE — ED Notes (Signed)
The following labs were labeled with appropriate pt sticker and tubed to lab:     [x]  Blue     [x]  Lavender   []  on ice  [x]  Green/yellow  []  Green/black []  on ice  []  Grey  []  on ice  []  Yellow  []  Red  []  Pink  []  ABG  []  VBG    []  COVID-19 swab    []  Rapid  []  PCR  []  Flu swab  []  Peds Viral Panel     []  Urine Sample  []  Pelvic Cultures  []  Blood Cultures  []  X 2  []  STREP Cultures         , RN  01/11/21 1248

## 2021-01-11 NOTE — ED Notes (Signed)
Respiratory at the bedside     Jabier Gauss, RN  01/11/21 1358

## 2021-01-11 NOTE — ED Notes (Signed)
Pt resting in position of comfort on cot presenting in no acute/ apparent distress (NAD). Respirations are noted even and unlabored with good rise and fall of the chest observed. Pt updated with current plan of care (POC) and all questions/ concerns addressed. Patient voices no needs at this time. Cot noted in lowest position, locked, with side rails X 2 up for patient safety. Will continue to monitor patient for acute changes.      [x]  Side rails up    [x]  Cart in lowest position    []  Family at bedside    [x]  Call light within reach         [facility], RN  01/11/21 1358

## 2021-02-11 ENCOUNTER — Emergency Department: Admit: 2021-02-11 | Payer: PRIVATE HEALTH INSURANCE

## 2021-02-11 ENCOUNTER — Inpatient Hospital Stay
Admit: 2021-02-11 | Discharge: 2021-02-12 | Disposition: A | Discharge status: Home / Self Care | Payer: PRIVATE HEALTH INSURANCE | Admitting: Geriatric Medicine

## 2021-02-11 DIAGNOSIS — J4531 Mild persistent asthma with (acute) exacerbation: Secondary | ICD-10-CM

## 2021-02-11 DIAGNOSIS — J45901 Unspecified asthma with (acute) exacerbation: Secondary | ICD-10-CM

## 2021-02-11 LAB — CBC WITH AUTO DIFFERENTIAL
Basophils %: 1.4 % (ref 0.0–2.0)
Basophils Absolute: 0.1 E9/L (ref 0.00–0.20)
Eosinophils %: 12 % — ABNORMAL HIGH (ref 0.0–6.0)
Eosinophils Absolute: 0.88 E9/L — ABNORMAL HIGH (ref 0.05–0.50)
Hematocrit: 43.7 % (ref 37.0–54.0)
Hemoglobin: 14.5 g/dL (ref 12.5–16.5)
Immature Granulocytes #: 0.02 E9/L
Immature Granulocytes %: 0.3 % (ref 0.0–5.0)
Lymphocytes %: 28 % (ref 20.0–42.0)
Lymphocytes Absolute: 2.05 E9/L (ref 1.50–4.00)
MCH: 28.2 pg (ref 26.0–35.0)
MCHC: 33.2 % (ref 32.0–34.5)
MCV: 84.9 fL (ref 80.0–99.9)
MPV: 10.2 fL (ref 7.0–12.0)
Monocytes %: 6.3 % (ref 2.0–12.0)
Monocytes Absolute: 0.46 E9/L (ref 0.10–0.95)
Neutrophils %: 52 % (ref 43.0–80.0)
Neutrophils Absolute: 3.81 E9/L (ref 1.80–7.30)
Platelets: 224 E9/L (ref 130–450)
RBC: 5.15 E12/L (ref 3.80–5.80)
RDW: 13.3 fL (ref 11.5–15.0)
WBC: 7.3 E9/L (ref 4.5–11.5)

## 2021-02-11 LAB — COMPREHENSIVE METABOLIC PANEL
ALT: 18 U/L (ref 0–40)
AST: 22 U/L (ref 0–39)
Albumin: 4.2 g/dL (ref 3.5–5.2)
Alkaline Phosphatase: 106 U/L (ref 40–129)
Anion Gap: 10 mmol/L (ref 7–16)
BUN: 14 mg/dL (ref 6–20)
CO2: 25 mmol/L (ref 22–29)
Calcium: 9.2 mg/dL (ref 8.6–10.2)
Chloride: 105 mmol/L (ref 98–107)
Creatinine: 1.1 mg/dL (ref 0.7–1.2)
Est, Glom Filt Rate: >60 mL/min/{1.73_m2} (ref >=60)
Glucose: 91 mg/dL (ref 74–99)
Potassium: 4 mmol/L (ref 3.5–5.0)
Sodium: 140 mmol/L (ref 132–146)
Total Bilirubin: <0.2 mg/dL (ref 0.0–1.2)
Total Protein: 6.9 g/dL (ref 6.4–8.3)

## 2021-02-11 LAB — COVID-19, RAPID: SARS-CoV-2, NAAT: NOT DETECTED

## 2021-02-11 LAB — TROPONIN: Troponin, High Sensitivity: <6 ng/L (ref 0–11)

## 2021-02-11 MED ORDER — ENOXAPARIN SODIUM 40 MG/0.4ML IJ SOSY
40 MG/0.4ML | Freq: Every day | INTRAMUSCULAR | Status: DC
Start: 2021-02-11 — End: 2021-02-12
  Administered 2021-02-11 – 2021-02-12 (×2): 40 mg via SUBCUTANEOUS

## 2021-02-11 MED ORDER — POLYETHYLENE GLYCOL 3350 17 G PO PACK
17 g | Freq: Every day | ORAL | Status: DC | PRN
Start: 2021-02-11 — End: 2021-02-12

## 2021-02-11 MED ORDER — ACETAMINOPHEN 500 MG PO TABS
500 MG | Freq: Once | ORAL | Status: AC
Start: 2021-02-11 — End: 2021-02-11
  Administered 2021-02-11: 09:00:00 1000 mg via ORAL

## 2021-02-11 MED ORDER — IPRATROPIUM-ALBUTEROL 0.5-2.5 (3) MG/3ML IN SOLN
RESPIRATORY_TRACT | Status: AC
Start: 2021-02-11 — End: 2021-02-11
  Administered 2021-02-11 (×3): 1 via RESPIRATORY_TRACT

## 2021-02-11 MED ORDER — SODIUM CHLORIDE 0.9 % IV SOLN
0.9 % | INTRAVENOUS | Status: DC | PRN
Start: 2021-02-11 — End: 2021-02-12

## 2021-02-11 MED ORDER — IPRATROPIUM-ALBUTEROL 0.5-2.5 (3) MG/3ML IN SOLN
Freq: Once | RESPIRATORY_TRACT | Status: AC
Start: 2021-02-11 — End: 2021-02-11
  Administered 2021-02-11: 10:00:00 3 via RESPIRATORY_TRACT

## 2021-02-11 MED ORDER — ARFORMOTEROL TARTRATE 15 MCG/2ML IN NEBU
15 MCG/2ML | Freq: Two times a day (BID) | RESPIRATORY_TRACT | Status: DC
Start: 2021-02-11 — End: 2021-02-12
  Administered 2021-02-11 – 2021-02-12 (×2): 15 ug via RESPIRATORY_TRACT

## 2021-02-11 MED ORDER — MONTELUKAST SODIUM 10 MG PO TABS
10 MG | Freq: Every evening | ORAL | Status: DC
Start: 2021-02-11 — End: 2021-02-12
  Administered 2021-02-12: 01:00:00 10 mg via ORAL

## 2021-02-11 MED ORDER — METHYLPREDNISOLONE SODIUM SUCC 40 MG IJ SOLR
40 MG | Freq: Once | INTRAMUSCULAR | Status: AC
Start: 2021-02-11 — End: 2021-02-11
  Administered 2021-02-11: 19:00:00 40 mg via INTRAVENOUS

## 2021-02-11 MED ORDER — ONDANSETRON 4 MG PO TBDP
4 MG | Freq: Three times a day (TID) | ORAL | Status: DC | PRN
Start: 2021-02-11 — End: 2021-02-12

## 2021-02-11 MED ORDER — NORMAL SALINE FLUSH 0.9 % IV SOLN
0.9 % | Freq: Two times a day (BID) | INTRAVENOUS | Status: DC
Start: 2021-02-11 — End: 2021-02-12
  Administered 2021-02-12: 13:00:00 10 mL via INTRAVENOUS
  Administered 2021-02-12: 02:00:00 5 mL via INTRAVENOUS

## 2021-02-11 MED ORDER — BUDESONIDE 0.5 MG/2ML IN SUSP
0.5 MG/2ML | Freq: Two times a day (BID) | RESPIRATORY_TRACT | Status: DC
Start: 2021-02-11 — End: 2021-02-12
  Administered 2021-02-11 – 2021-02-12 (×2): 500 mg via RESPIRATORY_TRACT

## 2021-02-11 MED ORDER — NORMAL SALINE FLUSH 0.9 % IV SOLN
0.9 % | INTRAVENOUS | Status: DC | PRN
Start: 2021-02-11 — End: 2021-02-12

## 2021-02-11 MED ORDER — MOMETASONE FURO-FORMOTEROL FUM 200-5 MCG/ACT IN AERO
200-5 MCG/ACT | Freq: Two times a day (BID) | RESPIRATORY_TRACT | Status: DC
Start: 2021-02-11 — End: 2021-02-11

## 2021-02-11 MED ORDER — FAMOTIDINE 20 MG PO TABS
20 MG | Freq: Two times a day (BID) | ORAL | Status: DC
Start: 2021-02-11 — End: 2021-02-12
  Administered 2021-02-11 – 2021-02-12 (×3): 20 mg via ORAL

## 2021-02-11 MED ORDER — ALBUTEROL SULFATE (2.5 MG/3ML) 0.083% IN NEBU
RESPIRATORY_TRACT | Status: DC | PRN
Start: 2021-02-11 — End: 2021-02-12
  Administered 2021-02-11 – 2021-02-12 (×3): 2.5 mg via RESPIRATORY_TRACT

## 2021-02-11 MED ORDER — ACETAMINOPHEN 325 MG PO TABS
325 MG | Freq: Four times a day (QID) | ORAL | Status: DC | PRN
Start: 2021-02-11 — End: 2021-02-12

## 2021-02-11 MED ORDER — ACETAMINOPHEN 650 MG RE SUPP
650 MG | Freq: Four times a day (QID) | RECTAL | Status: DC | PRN
Start: 2021-02-11 — End: 2021-02-12

## 2021-02-11 MED ORDER — ONDANSETRON HCL 4 MG/2ML IJ SOLN
4 MG/2ML | Freq: Four times a day (QID) | INTRAMUSCULAR | Status: DC | PRN
Start: 2021-02-11 — End: 2021-02-12

## 2021-02-11 MED FILL — MONTELUKAST SODIUM 10 MG PO TABS: 10 MG | ORAL | Qty: 1

## 2021-02-11 MED FILL — IPRATROPIUM-ALBUTEROL 0.5-2.5 (3) MG/3ML IN SOLN: RESPIRATORY_TRACT | Qty: 3

## 2021-02-11 MED FILL — SOLU-MEDROL 40 MG IJ SOLR: 40 MG | INTRAMUSCULAR | Qty: 40

## 2021-02-11 MED FILL — BROVANA 15 MCG/2ML IN NEBU: 15 MCG/2ML | RESPIRATORY_TRACT | Qty: 2

## 2021-02-11 MED FILL — IPRATROPIUM-ALBUTEROL 0.5-2.5 (3) MG/3ML IN SOLN: RESPIRATORY_TRACT | Qty: 9

## 2021-02-11 MED FILL — BD POSIFLUSH 0.9 % IV SOLN: 0.9 % | INTRAVENOUS | Qty: 10

## 2021-02-11 MED FILL — ALBUTEROL SULFATE (2.5 MG/3ML) 0.083% IN NEBU: RESPIRATORY_TRACT | Qty: 3

## 2021-02-11 MED FILL — ENOXAPARIN SODIUM 40 MG/0.4ML IJ SOSY: 40 MG/0.4ML | INTRAMUSCULAR | Qty: 0.4

## 2021-02-11 MED FILL — BUDESONIDE 0.5 MG/2ML IN SUSP: 0.5 MG/2ML | RESPIRATORY_TRACT | Qty: 2

## 2021-02-11 MED FILL — FAMOTIDINE 20 MG PO TABS: 20 MG | ORAL | Qty: 1

## 2021-02-11 MED FILL — ACETAMINOPHEN EXTRA STRENGTH 500 MG PO TABS: 500 MG | ORAL | Qty: 2

## 2021-02-11 NOTE — ED Notes (Signed)
Report called to 4SE, given to [place], Charity fundraiser.      Lucina Mellow, RN  02/11/21 1331

## 2021-02-11 NOTE — ED Notes (Signed)
Pt sleeping. VSS. Side rails up x 2, call bell in reach. Waiting for bed.      Lucina Mellow, RN  02/11/21 1100

## 2021-02-11 NOTE — H&P (Signed)
Admission History and Physical                                                                                    Mickel Fuchs, MD, FACP                   Patient Name: Ray Rodgers                   Age:  40 y.o.            Gender:   male    CC: Cough with increasing shortness of breath    HPI: Patient was interviewed and examined in the emergency room.  At the time of the exam he is feeling relatively comfortable.  He stated that last p.m. he started having cough and trouble breathing.  He has a history of asthma and has had these episodes before.  He indicated that he has been compliant with his medication.  He stated he is a past tobacco user and stopped 7 months ago.  He was given albuterol magnesium and Solu-Medrol by the EMS at the time of his initial evaluation with EMS his oxygen saturation was 80% on room air  Currently he is breathing much more comfortably  He is saturating at 94 to 95% on 2 L  His respiratory rate is normal  And his heart rate is in the low 90s with normal blood pressure      Past Medical History:   Diagnosis Date    Asthma        History reviewed. No pertinent surgical history.    No family status information on file.        Prior to Admission medications    Medication Sig Start Date End Date Taking? Authorizing Provider   albuterol sulfate HFA (VENTOLIN HFA) 108 (90 Base) MCG/ACT inhaler Inhale 2 puffs into the lungs 4 times daily as needed for Wheezing 01/11/21   Soyla Dryer, DO   albuterol sulfate HFA (PROVENTIL HFA) 108 (90 Base) MCG/ACT inhaler Inhale 2 puffs into the lungs every 4 hours as needed for Wheezing 12/26/20 12/26/21  Marianna Fuss, DO   montelukast (SINGULAIR) 10 MG tablet Take 1 tablet by mouth nightly 10/03/20   Risa Grill, APRN - CNP   Fluticasone-Salmeterol [phone] MCG/ACT AEPB Inhale 1 inhalation into the lungs in the morning and at bedtime 10/03/20   Risa Grill, APRN - CNP        Social  History     Socioeconomic History    Marital status: Single     Spouse name: None    Number of children: None    Years of education: None    Highest education level: None   Tobacco Use    Smoking status: Former     Types: Cigarettes     Quit date: 7 months ago     Years since quitting: 3.3    Smokeless tobacco: Never    Tobacco comments:     1-2 cigarettes per day    Substance and Sexual Activity    Alcohol use: Never    Drug use: Never  No Known Allergies    The patient's medical records have been reviewed contingent on availability    Review of Systems:   General: Denies malaise or weakness. Denies fever or chills.  Eyes: No visual changes or diplopia. No swelling or pain.  ENT: No Headaches, tinnitus or vertigo. No mouth sores or sore throat.  Cardiovascular: No chest pain, dyspnea on exertion, palpitations, syncope.  Respiratory: As per HPI  Gastrointestinal: No abdominal pain, anorexia, hematochezia, melena, hematemesis or change in bowels.  Genitourinary: No dysuria, trouble voiding, or hematuria. No change in urination.  Musculoskeletal:  No joint pain or inflammation. No limb weakness.  Integumentary: No rash or pruritis. No abnormal pigmentation,  masses, hair or nail changes  Neurological: No unusual headaches, weakness, paresthesias.   No change in gait, balance, coordination, memory, mentation or behavior.  Psychiatric: No anxiety, or depression. Mood and affect reported as normal  Endocrine: No temperature intolerance. No polydipsia or polyuria.  Hematologic: No abnormal bruising or bleeding, blood clots or swollen lymph nodes.no anemia, no fever,chills, night sweats, swollen glands.                                       Allergic/Immunologic: No nasal congestion or hives.      Physical Examination:      Wt Readings from Last 3 Encounters:   02/11/21 187 lb (84.8 kg)   12/26/20 187 lb (84.8 kg)   12/05/20 188 lb (85.3 kg)     Temp Readings from Last 3 Encounters:   02/11/21 97.5 ??F (36.4 ??C)  (Axillary)   01/11/21 98.5 ??F (36.9 ??C) (Oral)   12/26/20 98 ??F (36.7 ??C) (Oral)     BP Readings from Last 3 Encounters:   02/11/21 118/69   01/11/21 121/71   12/26/20 101/72     Pulse Readings from Last 3 Encounters:   02/11/21 99   01/11/21 64   12/26/20 90       General appearance: Normal, awake, alert no distress.   Skin: Color, texture, turgor normal. No rashes or lesions.  Head: Normocephalic. No masses, lesions, tenderness or abnormalities   Face: Symmetric no visible lesions  Eyes: Conjunctivae/cornea clear. PERRL, NROMEOM. Sclera non icteric.   Ears: External appearance normal.  Hearing grossly normal  Nose/Sinuses: Nares normal. No paranasal sinus tenderness.   Mouth: Lips and tongue appear normal. Dentition noted  Neck:  Symmetric. No adenopathy. Thyroid symmetric, normal size, without nodules. Trachea is midline.  Chest: Reduced excursion  Lungs: Clear to auscultation.  Short inspiratory effort with mildly prolonged expiratory phase but no longer wheezing  Heart: S1 > S2. Rhythm is regular and rate is normal. No gallop rub or murmur.  Abdomen: Soft, mildly protuberant, non-tender. BS normal. No masses, organomegaly. Anatomic contours appear normal.  Extremities: No deformities, edema, or skin discoloration. Peripheral perfusion assessed in all exremities. No cyanosis  Musculoskeletal: No unusual pain or swelling. Muscular strength intact.   Neuro:   Cranial nerves grossly intact.   Motor: Strength grossly normal. No focal weakness.   Sensory: grossly normal to touch.   Cerebellar testing was grossly normal  Mental status: Awake, alert, cognizant of person, place, time.    Patient appears capable of directing self care   Mood: Normal and appropriate affect  Gait & balance: not assessed:     Labs     CBC:   Lab Results   Component Value  Date/Time    WBC 7.3 02/11/2021 05:08 AM    RBC 5.15 02/11/2021 05:08 AM    HGB 14.5 02/11/2021 05:08 AM    HCT 43.7 02/11/2021 05:08 AM    PLT 224 02/11/2021 05:08 AM     MCV 84.9 02/11/2021 05:08 AM     BMP:    Lab Results   Component Value Date/Time    NA 140 02/11/2021 05:08 AM    K 4.0 02/11/2021 05:08 AM    K 4.2 01/11/2021 12:34 PM    CL 105 02/11/2021 05:08 AM    CO2 25 02/11/2021 05:08 AM    BUN 14 02/11/2021 05:08 AM    CREATININE 1.1 02/11/2021 05:08 AM    GLUCOSE 91 02/11/2021 05:08 AM    CALCIUM 9.2 02/11/2021 05:08 AM     Hepatic Function Panel:    Lab Results   Component Value Date/Time    ALKPHOS 106 02/11/2021 05:08 AM    AST 22 02/11/2021 05:08 AM    ALT 18 02/11/2021 05:08 AM    PROT 6.9 02/11/2021 05:08 AM    LABALBU 4.2 02/11/2021 05:08 AM    BILITOT <0.2 02/11/2021 05:08 AM     Magnesium:    Lab Results   Component Value Date/Time    MG 2.1 12/26/2020 07:54 AM     Cardiac Enzymes:   Lab Results   Component Value Date    TROPONINI <0.01 11/05/2018    TROPONINI <0.01 10/31/2018     LDH:  No results found for: LDH  PT/INR:  No results found for: PROTIME, INR  BNP: No results for input(s): BNP in the last 72 hours.   TSH:   Lab Results   Component Value Date    TSH 1.800 02/26/2018      Cardiac Injury Profile: No results for input(s): CKTOTAL, CKMB, CKMBINDEX, TROPONINI in the last 72 hours.   Lipid Profile:   Lab Results   Component Value Date/Time    TRIG 149 02/26/2018 02:58 PM    HDL 44 02/26/2018 02:58 PM    LDLCALC 115 02/26/2018 02:58 PM    CHOL 189 02/26/2018 02:58 PM      Hemoglobin A1C: No components found for: HGBA1C   U/A:   Lab Results   Component Value Date/Time    LEUKOCYTESUR Negative 11/06/2018 07:24 PM    PHUR 7.0 11/06/2018 07:24 PM    SPECGRAV 1.020 11/06/2018 07:24 PM    BLOODU Negative 11/06/2018 07:24 PM    GLUCOSEU Negative 11/06/2018 07:24 PM         ADMISSION SCHEDULED MEDS:   No current facility-administered medications for this encounter.     Current Outpatient Medications   Medication Sig Dispense Refill    albuterol sulfate HFA (VENTOLIN HFA) 108 (90 Base) MCG/ACT inhaler Inhale 2 puffs into the lungs 4 times daily as needed for Wheezing  18 g 0    albuterol sulfate HFA (PROVENTIL HFA) 108 (90 Base) MCG/ACT inhaler Inhale 2 puffs into the lungs every 4 hours as needed for Wheezing 1 each 1    montelukast (SINGULAIR) 10 MG tablet Take 1 tablet by mouth nightly 30 tablet 3    Fluticasone-Salmeterol 232-14 MCG/ACT AEPB Inhale 1 inhalation into the lungs in the morning and at bedtime 1 each 0       Current  Infusions      Prn Meds      Radiology Review:  XR CHEST PORTABLE   Final Result   No acute cardiopulmonary process.  ASSESSMENT:  Active diagnoses treated at this admission:  Acute exacerbation of asthma  Hypoxic respiratory failure resolved  Former tobacco abuse  History of cocaine use    Problem list:  Patient Active Problem List   Diagnosis    Mild persistent asthma without complication    Former smoker    Asthma exacerbation attacks    Exacerbation of asthma    Hyperglycemia    Asthma exacerbation, mild    IgE-mediated allergic disorder    Asthma with status asthmaticus    Cocaine use    Acute asthma exacerbation       PLAN:  Patient has been given bronchodilators  He has been admitted already observation status  He appears to be currently stabilized  We will institute DVT prophylaxis and PUD prophylaxis if steroids are administered          See  Orders  Mickel Fuchs, MD, FACP  9:09 AM  02/11/2021

## 2021-02-11 NOTE — ED Provider Notes (Signed)
6:49 AM EDT    I received this patient at sign out from Dr. Coralyn Mark   I have discussed the patient's initial exam, treatment and plan of care with the out going physician.   I have introduced my self to the patient / family and have answered their questions to this point.   I have examined the patient myself and reviewed ordered tests / medications and reviewed any available results to this point.   If a resident is involved in the Emergency Department care, I have discussed my findings and plan with them as well.    Asthma exacerbation  Still wheezing despite multiple nebs and mag  Has no family doctor or outpatient follow up  Medicine consulted for admission    1. Severe asthma with acute exacerbation, unspecified whether persistent              [place] W Micha Erck, MD  02/11/21 [phone]

## 2021-02-11 NOTE — ED Provider Notes (Addendum)
HPI  40 y.o. male presenting for shortness of breath. Symptoms started prior to arrival. They have been constant and severe in severity. They are worsened by nothing and relieved by nothing.  Breath that woke him from sleep.  He complains of chest pain and back pain.  Per EMS, he was 80% on room air.  He was given albuterol, magnesium, and Solu-Medrol en route.  He complains of sweats and has had productive cough.  He complains of headache as well.  He denies any abdominal pain.      --------------------------------------------- PAST HISTORY ---------------------------------------------  Past Medical History:  has a past medical history of Asthma.    Past Surgical History:  has no past surgical history on file.    Social History:  reports that he quit smoking about 3 years ago. His smoking use included cigarettes. He has never used smokeless tobacco. He reports that he does not drink alcohol and does not use drugs.    Family History: family history is not on file.     The patient???s home medications have been reviewed.    Allergies: Patient has no known allergies.      Review of Systems   Constitutional:  Negative for chills and fever.   HENT:  Negative for congestion and sore throat.    Eyes:  Negative for photophobia and visual disturbance.   Respiratory:  Positive for cough and shortness of breath.    Cardiovascular:  Positive for chest pain. Negative for leg swelling.   Gastrointestinal:  Negative for abdominal pain, diarrhea and nausea.   Genitourinary:  Negative for dysuria and flank pain.   Musculoskeletal:  Positive for back pain. Negative for myalgias.   Skin:  Negative for rash and wound.   Neurological:  Positive for headaches. Negative for dizziness and light-headedness.      Physical Exam  Constitutional:       General: He is in acute distress.      Appearance: Normal appearance. He is not ill-appearing.   HENT:      Head: Normocephalic and atraumatic.      Right Ear: External ear normal.      Left Ear:  External ear normal.      Nose: Nose normal.   Eyes:      Conjunctiva/sclera: Conjunctivae normal.   Cardiovascular:      Rate and Rhythm: Normal rate and regular rhythm.   Pulmonary:      Effort: Respiratory distress present.      Comments: Tachypneic with increased work of breathing, decreased aeration diffusely with wheezing  Abdominal:      General: There is no distension.      Palpations: Abdomen is soft.      Tenderness: There is no abdominal tenderness.   Musculoskeletal:         General: No swelling or deformity.   Skin:     General: Skin is warm and dry.   Neurological:      General: No focal deficit present.      Mental Status: He is alert.   Psychiatric:         Mood and Affect: Mood normal.        Procedures           -------------------------------------------------- RESULTS -------------------------------------------------    LABS:  Results for orders placed or performed during the hospital encounter of 02/11/21   COVID-19, Rapid    Specimen: Nasopharyngeal Swab   Result Value Ref Range    SARS-CoV-2, NAAT Not  Detected Not Detected   CBC with Auto Differential   Result Value Ref Range    WBC 7.3 4.5 - 11.5 E9/L    RBC 5.15 3.80 - 5.80 E12/L    Hemoglobin 14.5 12.5 - 16.5 g/dL    Hematocrit 16.9 67.8 - 54.0 %    MCV 84.9 80.0 - 99.9 fL    MCH 28.2 26.0 - 35.0 pg    MCHC 33.2 32.0 - 34.5 %    RDW 13.3 11.5 - 15.0 fL    Platelets 224 130 - 450 E9/L    MPV 10.2 7.0 - 12.0 fL    Neutrophils % 52.0 43.0 - 80.0 %    Immature Granulocytes % 0.3 0.0 - 5.0 %    Lymphocytes % 28.0 20.0 - 42.0 %    Monocytes % 6.3 2.0 - 12.0 %    Eosinophils % 12.0 (H) 0.0 - 6.0 %    Basophils % 1.4 0.0 - 2.0 %    Neutrophils Absolute 3.81 1.80 - 7.30 E9/L    Immature Granulocytes # 0.02 E9/L    Lymphocytes Absolute 2.05 1.50 - 4.00 E9/L    Monocytes Absolute 0.46 0.10 - 0.95 E9/L    Eosinophils Absolute 0.88 (H) 0.05 - 0.50 E9/L    Basophils Absolute 0.10 0.00 - 0.20 E9/L   Comprehensive Metabolic Panel   Result Value Ref Range     Sodium 140 132 - 146 mmol/L    Potassium 4.0 3.5 - 5.0 mmol/L    Chloride 105 98 - 107 mmol/L    CO2 25 22 - 29 mmol/L    Anion Gap 10 7 - 16 mmol/L    Glucose 91 74 - 99 mg/dL    BUN 14 6 - 20 mg/dL    Creatinine 1.1 0.7 - 1.2 mg/dL    Est, Glom Filt Rate >60 >=60 mL/min/1.73    Calcium 9.2 8.6 - 10.2 mg/dL    Total Protein 6.9 6.4 - 8.3 g/dL    Albumin 4.2 3.5 - 5.2 g/dL    Total Bilirubin <9.3 0.0 - 1.2 mg/dL    Alkaline Phosphatase 106 40 - 129 U/L    ALT 18 0 - 40 U/L    AST 22 0 - 39 U/L   Troponin   Result Value Ref Range    Troponin, High Sensitivity <6 0 - 11 ng/L       RADIOLOGY:  XR CHEST PORTABLE   Final Result   No acute cardiopulmonary process.             EKG:  This EKG is signed and interpreted by me.    Rate: 103  Rhythm: Sinus  Interpretation: no acute St elevations or depressions, increased T wave amplitude V2, V4, V5  Comparison: changes compared to previous EKG, tachycardia now present      ------------------------- NURSING NOTES AND VITALS REVIEWED ---------------------------  Date / Time Roomed:  02/11/2021  4:41 AM  ED Bed Assignment:  07/07    The nursing notes within the ED encounter and vital signs as below have been reviewed.     Patient Vitals for the past 24 hrs:   BP Temp Temp src Pulse Resp SpO2 Height Weight   02/11/21 0656 118/69 -- -- 99 18 94 % -- --   02/11/21 0445 (!) 145/92 97.5 ??F (36.4 ??C) Axillary (!) 115 20 94 % 6' (1.829 m) 187 lb (84.8 kg)       Oxygen Saturation Interpretation: Improved after treatment  MDM  Number of Diagnoses or Management Options  Severe asthma with acute exacerbation, unspecified whether persistent  Diagnosis management comments: 40 year old male presenting for asthma exacerbation.  No acute changes on EKG.  Chest x-ray did not show any focal consolidations, pneumothorax, or other abnormalities.  Labs unremarkable.  COVID-negative.  Patient given duo nebs with improvement, however repeat lung exam showed persistent wheezing and decreased  aeration.  Due to hypoxia in the setting of asthma exacerbation, decision was made to admit patient for further evaluation and treatment.  Patient accepted for admission by Dr. Forrestine Him.          Counseling:  I have spoken with the patient and discussed today???s results, in addition to providing specific details for the plan of care and counseling regarding the diagnosis and prognosis.  Their questions are answered at this time and they are agreeable with the plan of admission.    --------------------------------- ADDITIONAL PROVIDER NOTES ---------------------------------    This patient's ED course included: a personal history and physicial examination, re-evaluation prior to disposition, multiple bedside re-evaluations, IV medications, cardiac monitoring, continuous pulse oximetry, and complex medical decision making and emergency management    This patient has remained hemodynamically stable during their ED course.    Diagnosis:  1. Severe asthma with acute exacerbation, unspecified whether persistent        Disposition:  Patient's disposition: Admit to telemetry  Patient's condition is stable.       Arn Medal, MD  Resident  02/11/21 [phone]  ATTENDING PROVIDER ATTESTATION:     I have personally performed and/or participated in the history, exam, medical decision making, and procedures and agree with all pertinent clinical information.      I have also reviewed and agree with the past medical, family and social history unless otherwise noted.    I have discussed this patient in detail with the resident, and provided the instruction and education regarding sob.    My findings/Plan: I was the primary provider for patient.  Patient presented here because of shortness of breath.  Patient symptoms started hours prior to arrival.  Patient reporting pain in his upper back he also reports headache.  Patient reporting symptoms like this in the past.  Patient reports the same days had today are worse than he has had in the  past.  Patient does have a history of asthma.  Patient reportedly hypoxic at home.  Patient was given magnesium Solu-Medrol as well as breathing treatments prior arrival he was also placed on supplemental oxygen.  Patient on arrival here is in mild to moderate distress.  Patient lungs he has diminished breath sounds with wheezes.  Patient abdomen is soft nontender.  Patient has no edema.  Patient neurologically intact.  Patient labs noted reviewed.  EKG sinus rhythm no acute ST elevation.  Nonspecific ST changes.  I agree with interpretation by resident.  Patient chest x-ray shows no acute findings.  Patient medicated here with breathing treatments he was placed on monitor.  Patient on recheck improved Tylenol was given for his headache his headache essentially is gone.  Patient still has expiratory wheezes and mild distress.  He was remedicated here.  Patient was made aware of plan that if he has not improved plan will be to admit.Dwain Sarna, MD  02/11/21 2115       Dwain Sarna, MD  02/11/21 [id number]       Dwain Sarna, MD  02/11/21 2116

## 2021-02-12 MED ORDER — PREDNISONE 10 MG PO TABS
10 MG | ORAL_TABLET | Freq: Three times a day (TID) | ORAL | 0 refills | Status: DC
Start: 2021-02-12 — End: 2021-02-12

## 2021-02-12 MED ORDER — PREDNISONE 10 MG PO TABS
10 MG | ORAL_TABLET | Freq: Three times a day (TID) | ORAL | 0 refills | Status: AC
Start: 2021-02-12 — End: 2021-02-17

## 2021-02-12 MED ORDER — FAMOTIDINE 20 MG PO TABS
20 MG | ORAL_TABLET | Freq: Two times a day (BID) | ORAL | 3 refills | Status: AC
Start: 2021-02-12 — End: ?

## 2021-02-12 MED ORDER — FAMOTIDINE 20 MG PO TABS
20 MG | ORAL_TABLET | Freq: Two times a day (BID) | ORAL | 3 refills | Status: DC
Start: 2021-02-12 — End: 2021-02-12

## 2021-02-12 MED FILL — ENOXAPARIN SODIUM 40 MG/0.4ML IJ SOSY: 40 MG/0.4ML | INTRAMUSCULAR | Qty: 0.4

## 2021-02-12 MED FILL — FAMOTIDINE 20 MG PO TABS: 20 MG | ORAL | Qty: 1

## 2021-02-12 MED FILL — BD POSIFLUSH 0.9 % IV SOLN: 0.9 % | INTRAVENOUS | Qty: 10

## 2021-02-12 NOTE — Progress Notes (Signed)
Ambulating pulse ox test performed on patient.  Patient SpO2 94% resting on room air.  Patient ambulated approximately 100 ft and SpO2 remained 94%. Patient some what weak on ambulation and coughing exacerbated with ambulation.   Electronically signed by Lavinia Sharps, RN on 02/12/21 at 9:17 AM EDT

## 2021-02-12 NOTE — Plan of Care (Signed)
Problem: Discharge Planning  Goal: Discharge to home or other facility with appropriate resources  Outcome: Progressing  Flowsheets (Taken 02/11/2021 2300)  Discharge to home or other facility with appropriate resources:   Identify barriers to discharge with patient and caregiver   Identify discharge learning needs (meds, wound care, etc)     Problem: Pain  Goal: Verbalizes/displays adequate comfort level or baseline comfort level  Outcome: Progressing  Flowsheets (Taken 02/11/2021 2300)  Verbalizes/displays adequate comfort level or baseline comfort level:   Encourage patient to monitor pain and request assistance   Assess pain using appropriate pain scale   Administer analgesics based on type and severity of pain and evaluate response

## 2021-02-12 NOTE — Plan of Care (Signed)
Problem: Discharge Planning  Goal: Discharge to home or other facility with appropriate resources  02/12/2021 1003 by Lavinia Sharps, RN  Outcome: Completed  02/12/2021 0457 by Johnnette Litter, RN  Outcome: Progressing  Flowsheets (Taken 02/11/2021 2300)  Discharge to home or other facility with appropriate resources:   Identify barriers to discharge with patient and caregiver   Identify discharge learning needs (meds, wound care, etc)     Problem: Pain  Goal: Verbalizes/displays adequate comfort level or baseline comfort level  02/12/2021 1003 by Lavinia Sharps, RN  Outcome: Completed  02/12/2021 0457 by Johnnette Litter, RN  Outcome: Progressing  Flowsheets (Taken 02/11/2021 2300)  Verbalizes/displays adequate comfort level or baseline comfort level:   Encourage patient to monitor pain and request assistance   Assess pain using appropriate pain scale   Administer analgesics based on type and severity of pain and evaluate response

## 2021-02-12 NOTE — Discharge Summary (Signed)
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 No pertinent surgical history.    Admission Condition:  fair    Discharged Condition:  good    Labs:  CBC:   Lab Results   Component Value Date/Time    WBC 7.3 02/11/2021 05:08 AM    RBC 5.15 02/11/2021 05:08 AM    HGB 14.5 02/11/2021 05:08 AM    HCT 43.7 02/11/2021 05:08 AM    MCV 84.9 02/11/2021 05:08 AM    MCH 28.2 02/11/2021 05:08 AM    MCHC 33.2 02/11/2021 05:08 AM    RDW 13.3 02/11/2021 05:08 AM    PLT 224 02/11/2021 05:08 AM    MPV 10.2 02/11/2021 05:08 AM     CMP:    Lab Results   Component Value Date/Time    NA 140 02/11/2021 05:08 AM    K 4.0 02/11/2021 05:08 AM    K 4.2 01/11/2021 12:34 PM    CL 105 02/11/2021 05:08 AM    CO2 25 02/11/2021 05:08 AM    BUN 14 02/11/2021 05:08 AM    CREATININE 1.1 02/11/2021 05:08 AM    GFRAA >60 01/11/2021 12:34 PM    LABGLOM >60 02/11/2021 05:08 AM    GLUCOSE 91 02/11/2021 05:08 AM    PROT 6.9 02/11/2021 05:08 AM    LABALBU 4.2 02/11/2021 05:08 AM    CALCIUM 9.2 02/11/2021 05:08 AM    BILITOT <0.2 02/11/2021 05:08 AM    ALKPHOS 106 02/11/2021 05:08 AM    AST 22 02/11/2021 05:08 AM    ALT 18 02/11/2021 05:08 AM        Radiology Results: XR CHEST PORTABLE    Result Date:  02/11/2021  EXAMINATION: ONE XRAY VIEW OF THE CHEST10/29/2022 TECHNIQUE: Frontal view submitted COMPARISON: None. HISTORY: ORDERING SYSTEM PROVIDED HISTORY: chest pain sob TECHNOLOGIST PROVIDED HISTORY: Reason for exam:->chest pain sob What reading provider will be dictating this exam?->CRC FINDINGS: The lungs are clear without focal consolidation, large pleural effusion, or pneumothorax. The cardiomediastinal silhouette is stable.     No acute cardiopulmonary process.       Hospital Course:  Patient was interviewed and examined in the emergency room.  At the time of the exam he is feeling relatively comfortable.  He stated that last p.m. he started having cough and trouble breathing.  He has a history of asthma and has had these episodes before.  He indicated that he has been compliant with his medication.  He  stated he is a past tobacco user and stopped 7 months ago.  He was given albuterol magnesium and Solu-Medrol by the EMS at the time of his initial evaluation with EMS his oxygen saturation was 80% on room air  Currently he is breathing much more comfortably  He is saturating at 94 to 95% on 2 L  His respiratory rate is normal  And his heart rate is in the low 90s with normal blood pressure     Subsequent to his admission with repeat interview it was determined by staff as well as by my personal interview that this patient has been noncompliant with his home medications.  He states he ran out of his medications few weeks ago and has not taken them since.  We have encouraged compliance    He received a dose of steroid here  He is feeling much better now  He was ambulated and maintained his oxygen status satisfactorily  He still has a sibilant cough    Encouraged him to obtain a PCP  And encouraged him to maintain his medications as instructed  He will be given a short-term dose of steroids as well as H2 blockers for PUD prophylaxis    Discharge Medications:      Medication List        START taking these  medications      famotidine 20 MG tablet  Commonly known as: PEPCID  Take 1 tablet by mouth 2 times daily     predniSONE 10 MG tablet  Commonly known as: DELTASONE  Take 1 tablet by mouth 3 times daily (with meals) for 5 days            CHANGE how you take these medications      albuterol sulfate HFA 108 (90 Base) MCG/ACT inhaler  Commonly known as: Proventil HFA  Inhale 2 puffs into the lungs every 4 hours as needed for Wheezing  What changed: Another medication with the same name was removed. Continue taking this medication, and follow the directions you see here.            CONTINUE taking these medications      Fluticasone-Salmeterol 232-14 MCG/ACT Aepb  Inhale 1 inhalation into the lungs in the morning and at bedtime     montelukast 10 MG tablet  Commonly known as: SINGULAIR  Take 1 tablet by mouth nightly               Where to Get Your Medications        These medications were sent to [facility] - [facility], [facility] - [address] - P [phone] - F [phone]  [address], [place] [address]      Phone: [phone]   famotidine 20 MG tablet  predniSONE 10 MG tablet         Discharge Exam:  General appearance: Normal, awake, alert no distress.   Skin: Color, texture, turgor normal. No rashes or lesions.  Head: Normocephalic. No masses, lesions, tenderness or abnormalities   Face: Symmetric no visible lesions  Eyes: Conjunctivae/cornea clear. PERRL, NROMEOM. Sclera non icteric.   Ears: External appearance normal.  Hearing grossly normal  Nose/Sinuses: Nares normal. No paranasal sinus tenderness.   Mouth: Lips and tongue appear normal. Dentition noted  Neck:  Symmetric. No adenopathy. Thyroid symmetric, normal size, without nodules. Trachea is midline.  Chest: Reduced excursion  Lungs: Clear to auscultation.  Short inspiratory effort with mildly prolonged expiratory phase but no longer wheezing  Heart: S1 > S2. Rhythm is regular and rate is normal. No gallop rub or murmur.  Abdomen: Soft,  mildly protuberant, non-tender. BS normal. No masses, organomegaly. Anatomic contours appear normal.  Extremities: No deformities, edema, or skin discoloration. Peripheral perfusion assessed in all exremities. No cyanosis  Musculoskeletal: No unusual pain or swelling. Muscular strength intact.   Neuro:   Normal with no deficits  Mental status: Awake, alert, cognizant of person, place, time.    Patient appears capable of directing self care   Mood: Normal and appropriate affect  Gait & balance: normal    Disposition:   Patient has been discharged home      Patient Instructions:   All of his medications has been refilled  Since he indicated he stopped taking his medication a few weeks prior to admission encourage medication compliance  Short-term steroids have been prescribed  Patient has no PCP and was advised to enroll with the PCP and staff will assist  REFER TO AVR or COC document    Signed:  Mickel Fuchs MD FACP  American Board of Internal Medicine  American Board of Geriatric Medicine  02/12/2021, 9:31 AM

## 2021-02-13 LAB — EKG 12-LEAD
Atrial Rate: 103 {beats}/min
P Axis: 75 degrees
P-R Interval: 158 ms
Q-T Interval: 346 ms
QRS Duration: 90 ms
QTc Calculation (Bazett): 453 ms
R Axis: 37 degrees
T Axis: 61 degrees
Ventricular Rate: 103 {beats}/min

## 2021-04-03 ENCOUNTER — Inpatient Hospital Stay
Admit: 2021-04-03 | Discharge: 2021-04-03 | Disposition: A | Discharge status: Home / Self Care | Payer: PRIVATE HEALTH INSURANCE | Attending: Emergency Medicine

## 2021-04-03 ENCOUNTER — Emergency Department: Admit: 2021-04-03 | Payer: PRIVATE HEALTH INSURANCE

## 2021-04-03 DIAGNOSIS — J4521 Mild intermittent asthma with (acute) exacerbation: Secondary | ICD-10-CM

## 2021-04-03 LAB — EKG 12-LEAD
Atrial Rate: 96 {beats}/min
P Axis: 68 degrees
P-R Interval: 162 ms
Q-T Interval: 334 ms
QRS Duration: 88 ms
QTc Calculation (Bazett): 421 ms
R Axis: 41 degrees
T Axis: 63 degrees
Ventricular Rate: 96 {beats}/min

## 2021-04-03 LAB — BASIC METABOLIC PANEL
Anion Gap: 11 mmol/L (ref 7–16)
BUN: 15 mg/dL (ref 6–20)
CO2: 23 mmol/L (ref 22–29)
Calcium: 9.6 mg/dL (ref 8.6–10.2)
Chloride: 106 mmol/L (ref 98–107)
Creatinine: 1.1 mg/dL (ref 0.7–1.2)
Est, Glom Filt Rate: >60 mL/min/{1.73_m2} (ref >=60)
Glucose: 104 mg/dL — ABNORMAL HIGH (ref 74–99)
Potassium: 4.5 mmol/L (ref 3.5–5.0)
Sodium: 140 mmol/L (ref 132–146)

## 2021-04-03 LAB — CBC WITH AUTO DIFFERENTIAL
Basophils %: 1.3 % (ref 0.0–2.0)
Basophils Absolute: 0.09 E9/L (ref 0.00–0.20)
Eosinophils %: 10.3 % — ABNORMAL HIGH (ref 0.0–6.0)
Eosinophils Absolute: 0.73 E9/L — ABNORMAL HIGH (ref 0.05–0.50)
Hematocrit: 46.6 % (ref 37.0–54.0)
Hemoglobin: 15.3 g/dL (ref 12.5–16.5)
Immature Granulocytes #: 0.03 E9/L
Immature Granulocytes %: 0.4 % (ref 0.0–5.0)
Lymphocytes %: 27.7 % (ref 20.0–42.0)
Lymphocytes Absolute: 1.96 E9/L (ref 1.50–4.00)
MCH: 27.2 pg (ref 26.0–35.0)
MCHC: 32.8 % (ref 32.0–34.5)
MCV: 82.8 fL (ref 80.0–99.9)
MPV: 10.1 fL (ref 7.0–12.0)
Monocytes %: 7.4 % (ref 2.0–12.0)
Monocytes Absolute: 0.52 E9/L (ref 0.10–0.95)
Neutrophils %: 52.9 % (ref 43.0–80.0)
Neutrophils Absolute: 3.74 E9/L (ref 1.80–7.30)
Platelets: 248 E9/L (ref 130–450)
RBC: 5.63 E12/L (ref 3.80–5.80)
RDW: 13.6 fL (ref 11.5–15.0)
WBC: 7.1 E9/L (ref 4.5–11.5)

## 2021-04-03 LAB — RAPID INFLUENZA A/B ANTIGENS
Influenza A by PCR: NOT DETECTED
Influenza B by PCR: NOT DETECTED

## 2021-04-03 LAB — TROPONIN: Troponin, High Sensitivity: <6 ng/L (ref 0–11)

## 2021-04-03 LAB — COVID-19, RAPID: SARS-CoV-2, NAAT: NOT DETECTED

## 2021-04-03 MED ORDER — CETIRIZINE HCL 10 MG PO TABS
10 MG | ORAL_TABLET | Freq: Every day | ORAL | 5 refills | Status: AC
Start: 2021-04-03 — End: 2021-05-03

## 2021-04-03 MED ORDER — DEXAMETHASONE SODIUM PHOSPHATE 10 MG/ML IJ SOLN
10 MG/ML | Freq: Once | INTRAMUSCULAR | Status: AC
Start: 2021-04-03 — End: 2021-04-03
  Administered 2021-04-03: 15:00:00 10 mg via INTRAVENOUS

## 2021-04-03 MED ORDER — IPRATROPIUM-ALBUTEROL 0.5-2.5 (3) MG/3ML IN SOLN
Freq: Once | RESPIRATORY_TRACT | Status: AC
Start: 2021-04-03 — End: 2021-04-03
  Administered 2021-04-03: 14:00:00 3 via RESPIRATORY_TRACT

## 2021-04-03 MED ORDER — PREDNISONE 20 MG PO TABS
20 MG | ORAL_TABLET | Freq: Two times a day (BID) | ORAL | 0 refills | Status: AC
Start: 2021-04-03 — End: 2021-04-08

## 2021-04-03 MED ORDER — FLUTICASONE-SALMETEROL 100-50 MCG/ACT IN AEPB
100-50 MCG/ACT | Freq: Two times a day (BID) | RESPIRATORY_TRACT | 0 refills | Status: AC
Start: 2021-04-03 — End: ?

## 2021-04-03 MED ORDER — ALBUTEROL SULFATE HFA 108 (90 BASE) MCG/ACT IN AERS
108 (90 Base) MCG/ACT | Freq: Four times a day (QID) | RESPIRATORY_TRACT | Status: DC | PRN
Start: 2021-04-03 — End: 2021-04-03
  Administered 2021-04-03: 17:00:00 2 via RESPIRATORY_TRACT

## 2021-04-03 MED ORDER — ALBUTEROL SULFATE HFA 108 (90 BASE) MCG/ACT IN AERS
108 (90 Base) MCG/ACT | Freq: Four times a day (QID) | RESPIRATORY_TRACT | 1 refills | Status: AC | PRN
Start: 2021-04-03 — End: ?

## 2021-04-03 MED FILL — IPRATROPIUM-ALBUTEROL 0.5-2.5 (3) MG/3ML IN SOLN: RESPIRATORY_TRACT | Qty: 9

## 2021-04-03 MED FILL — ALBUTEROL SULFATE HFA 108 (90 BASE) MCG/ACT IN AERS: 108 (90 Base) MCG/ACT | RESPIRATORY_TRACT | Qty: 8.5

## 2021-04-03 MED FILL — DEXAMETHASONE SODIUM PHOSPHATE 10 MG/ML IJ SOLN: 10 MG/ML | INTRAMUSCULAR | Qty: 1

## 2021-04-03 NOTE — ED Provider Notes (Signed)
ATTENDING PROVIDER ATTESTATION:     Ray Rodgers presented to the emergency department for evaluation of Shortness of Breath (SOB since yesterday, worse this AM, hx of asthma, out of meds,  EMS found at 90% RA, gave 2 albuterol and 1 solu med tx, now 96% RA )   and was initially evaluated by the Medical Resident. See Original ED Note for H&P and ED course above.     I have reviewed and discussed the case, including pertinent history (medical, surgical, family and social) and exam findings with the Medical Resident assigned to [facility].  I have personally performed and/or participated in the history, exam, medical decision making, and procedures and agree with all pertinent clinical information and any additional changes or corrections are noted below in my assessment and plan. I have discussed this patient in detail with the resident, and provided the instruction and education,       I have reviewed my findings and recommendations with the assigned Medical Resident, Ray Rodgers and members of family present at the time of disposition.      I have performed a history and physical examination of this patient and directly supervised the resident caring for this patient      History of Present Illness:    Presents to the ED for shortness of breath, beginning yesterday.  The complaint has been constant, moderate in severity, and worsened by nothing.  History of asthma, reports worsening shortness of breath since yesterday.  He said he ran out of his asthma medication.  He reports that he was wheezing.  EMS was called, he was found to be hypoxemic.  Given 2 albuterol treatments and a dose of Solu-Medrol.  Oxygen improved back to normal.  Wheezing has improved but he still has wheezing.  He denies fever or chills.  He says he still feels tight in his lungs.  He denies real chest pain but says his lungs and upper chest feel tight secondary to his wheezing.  He denies abdominal pain.  No back pain.  No calf  swelling.  No leg tenderness.  No orthopnea.  No sick contacts.        Review of Systems:   A complete review of systems was performed and pertinent positives and negatives are stated within HPI, all other systems reviewed and are negative.    --------------------------------------------- PAST HISTORY ---------------------------------------------  Past Medical History:  has a past medical history of Asthma.    Past Surgical History: denies    Social History:  reports that he quit smoking about 3 years ago. His smoking use included cigarettes. He has never used smokeless tobacco. He reports that he does not drink alcohol and does not use drugs.    Family History: family history is not on file.  Unless otherwise noted, family history is non contributory    The patient???s home medications have been reviewed.    Allergies: Patient has no known allergies.    EKG Interpretation  Interpreted by emergency department physician, Dr. Rexanne Mano     04/03/21  Time: 0826    Normal sinus rhythm, normal axis, normal rate, no ST segment elevation or ST segment depression, normal T waves, impression: Normal sinus rhythm, no acute ischemic changes, stable from prior        Physical Exam:  Constitutional/General: Alert and oriented x3  Head: Normocephalic and atraumatic  Eyes: PERRL, EOMI, sclera non icteric  ENT: Oropharynx clear, handling secretions  Neck: Supple, full ROM, no  stridor, no meningeal signs  Respiratory: Lungs with diffuse wheezing bilaterally.  Not in respiratory distress  Cardiovascular:  Regular rate. Regular rhythm. No murmurs, no gallops, no rubs. 2+ distal pulses. Equal extremity pulses.   GI:  Abdomen Soft, Non tender, Non distended. No rebound, guarding, or rigidity. No pulsatile masses.  Musculoskeletal: Moves all extremities x 4. Warm and well perfused,  no clubbing, no cyanosis, no edema. Palpable peripheral pulses no.  No calf Tenderness or palpable cords.  Integument: skin warm and dry. No rashes.   Neurologic:  GCS 15, no focal deficits  Psychiatric: Normal Affect      I directly supervised any procedures performed by the resident and was present for the procedure including all critical portions of the procedure       Oxygen Saturation Interpretation: 96 % on RA.       I, Dr. Rexanne Mano, am the primary provider of record      Medical Decision Making:     ED Course as of 04/03/21 1105   Mon Apr 03, 2021   8469 EKG:  This EKG is signed and interpreted by EP.    Rate: 96  Rhythm: Sinus  Interpretation: no acute changes, no st segment elevations/depressions, T wave abnormalities.   Comparison: was normal and stable as compared to patient's most recent EKG  [JR]   0830 Reevaluated patient.  After he received his breathing treatments.  Lung sounds have improved, less tightness, less inspiratory/expiratory wheezing.  He reports he feels much better and breathing is much better.  Patient is still going get Decadron. [JR]      ED Course User Index  [JR] Ulysees Barns, DO       Moderately severe asthma exacerbation.  Presented with wheezing, improved with nebs and steroids.  Additional breathing treatments given.  He is feeling better.  No chest pain.  EKG reassuring.  Troponin negative.  MI ruled out.  Nothing to suggest PE.  No pain with breathing. No leg swelling. No signs of chf.     Independent interpretation of Laboratory tests by [place] Xana Bradt, MD:      Results for orders placed or performed during the hospital encounter of 04/03/21   RAPID INFLUENZA A/B ANTIGENS    Specimen: Nasopharyngeal   Result Value Ref Range    Influenza A by PCR Not Detected Not Detected    Influenza B by PCR Not Detected Not Detected   COVID-19, Rapid    Specimen: Nasopharyngeal Swab   Result Value Ref Range    SARS-CoV-2, NAAT Not Detected Not Detected   BMP   Result Value Ref Range    Sodium 140 132 - 146 mmol/L    Potassium 4.5 3.5 - 5.0 mmol/L    Chloride 106 98 - 107 mmol/L    CO2 23 22 - 29 mmol/L    Anion Gap 11 7 - 16 mmol/L    Glucose 104 (H) 74  - 99 mg/dL    BUN 15 6 - 20 mg/dL    Creatinine 1.1 0.7 - 1.2 mg/dL    Est, Glom Filt Rate >60 >=60 mL/min/1.73    Calcium 9.6 8.6 - 10.2 mg/dL   CBC with Auto Differential   Result Value Ref Range    WBC 7.1 4.5 - 11.5 E9/L    RBC 5.63 3.80 - 5.80 E12/L    Hemoglobin 15.3 12.5 - 16.5 g/dL    Hematocrit 62.9 52.8 - 54.0 %    MCV 82.8 80.0 - 99.9  fL    MCH 27.2 26.0 - 35.0 pg    MCHC 32.8 32.0 - 34.5 %    RDW 13.6 11.5 - 15.0 fL    Platelets 248 130 - 450 E9/L    MPV 10.1 7.0 - 12.0 fL    Neutrophils % 52.9 43.0 - 80.0 %    Immature Granulocytes % 0.4 0.0 - 5.0 %    Lymphocytes % 27.7 20.0 - 42.0 %    Monocytes % 7.4 2.0 - 12.0 %    Eosinophils % 10.3 (H) 0.0 - 6.0 %    Basophils % 1.3 0.0 - 2.0 %    Neutrophils Absolute 3.74 1.80 - 7.30 E9/L    Immature Granulocytes # 0.03 E9/L    Lymphocytes Absolute 1.96 1.50 - 4.00 E9/L    Monocytes Absolute 0.52 0.10 - 0.95 E9/L    Eosinophils Absolute 0.73 (H) 0.05 - 0.50 E9/L    Basophils Absolute 0.09 0.00 - 0.20 E9/L   Troponin   Result Value Ref Range    Troponin, High Sensitivity <6 0 - 11 ng/L   EKG 12 Lead   Result Value Ref Range    Ventricular Rate 96 BPM    Atrial Rate 96 BPM    P-R Interval 162 ms    QRS Duration 88 ms    Q-T Interval 334 ms    QTc Calculation (Bazett) 421 ms    P Axis 68 degrees    R Axis 41 degrees    T Axis 63 degrees         Independent wet read interpretation of Radiology tests by [place] Samie Barclift, MD:       Final Read By radiology  XR CHEST (2 VW)   Final Result   COPD with mild chronic lung changes.      No acute infiltrate or effusion.               Name and Route of medications administered in the ED:  Medications   ipratropium-albuterol (DUONEB) nebulizer solution 3 ampule (3 ampules Inhalation Given 04/03/21 0833)   dexamethasone (DECADRON) injection 10 mg (10 mg IntraVENous Given 04/03/21 0948)            Re-Evaluations:      Improving  Wheezing improving      This patient's ED course included: a personal history and physicial examination,  re-evaluation prior to disposition, multiple bedside re-evaluations, IV medications, and complex medical decision making and emergency management    This patient has remained hemodynamically stable during their ED course.         1. Mild intermittent asthma with exacerbation    2. Dyspnea, unspecified type      o     [place] W Kaysa Roulhac, MD  04/03/21 1105

## 2021-04-03 NOTE — ED Provider Notes (Signed)
40 y.o. year old male with history of poorly controlled asthma, lost to PCP follow-up presenting to the emergency room with concerns of asthma exaceration.  Reports that symptom's onset this morning. Worsen with cold air, seasonal allergies.  Improves with albuterol inhaler and steroids.  Duration, intermittent, worsening.  Characterization: Shortness of breath and wheezing with inspiration/expiration.  Severity: Mild to moderate.  Patient denies any hemoptysis, chest pain/palpitations, nausea/vomiting, cyanosis, swelling in the legs, chest tenderness or pain  Chief Complaint   Patient presents with    Shortness of Breath     SOB since yesterday, worse this AM, hx of asthma, out of meds,  EMS found at 90% RA, gave 2 albuterol and 1 solu med tx, now 96% RA        Review of Systems   Constitutional:  Negative for activity change and appetite change.   HENT:  Negative for congestion and dental problem.    Eyes:  Negative for discharge and itching.   Respiratory:  Positive for wheezing. Negative for cough and shortness of breath.    Cardiovascular:  Negative for chest pain and leg swelling.   Gastrointestinal:  Negative for abdominal distention and abdominal pain.   Endocrine: Negative for cold intolerance and heat intolerance.   Genitourinary:  Negative for dysuria and enuresis.   Musculoskeletal:  Negative for arthralgias and back pain.   Skin:  Negative for color change and pallor.   Allergic/Immunologic: Negative for environmental allergies and food allergies.   Neurological:  Negative for dizziness and facial asymmetry.   Hematological:  Negative for adenopathy. Does not bruise/bleed easily.   Psychiatric/Behavioral:  Negative for agitation and behavioral problems.       Physical Exam  Vitals reviewed.   Constitutional:       General: He is not in acute distress.     Appearance: He is normal weight. He is not ill-appearing, toxic-appearing or diaphoretic.   HENT:      Head: Normocephalic and atraumatic.      Right  Ear: External ear normal.      Left Ear: External ear normal.      Nose: Nose normal.      Mouth/Throat:      Mouth: Mucous membranes are moist.      Pharynx: Oropharynx is clear.   Eyes:      General:         Right eye: No discharge.         Left eye: No discharge.      Extraocular Movements: Extraocular movements intact.      Conjunctiva/sclera: Conjunctivae normal.      Pupils: Pupils are equal, round, and reactive to light.   Neck:      Thyroid: No thyromegaly.      Vascular: No hepatojugular reflux.   Cardiovascular:      Rate and Rhythm: Regular rhythm. Tachycardia present.      Pulses: Normal pulses.      Heart sounds:     No friction rub. No gallop.   Pulmonary:      Effort: Pulmonary effort is normal. No accessory muscle usage or respiratory distress.      Breath sounds: No stridor. Examination of the right-upper field reveals decreased breath sounds and wheezing. Examination of the left-upper field reveals decreased breath sounds and wheezing. Examination of the right-middle field reveals decreased breath sounds and wheezing. Examination of the left-middle field reveals decreased breath sounds and wheezing. Decreased breath sounds and wheezing present. No rhonchi or  rales.   Chest:      Chest wall: No mass or tenderness.   Abdominal:      General: There is no distension.      Palpations: Abdomen is soft. There is no mass.      Tenderness: There is no abdominal tenderness. There is no guarding or rebound.   Musculoskeletal:         General: No deformity or signs of injury.      Cervical back: Normal range of motion. No rigidity.      Right lower leg: No tenderness.      Left lower leg: No tenderness.   Skin:     General: Skin is warm.      Capillary Refill: Capillary refill takes less than 2 seconds.      Coloration: Skin is not cyanotic, jaundiced or pale.      Findings: No ecchymosis.   Neurological:      General: No focal deficit present.      Mental Status: He is alert and oriented to person, place, and  time.      Sensory: No sensory deficit.      Motor: No weakness.   Psychiatric:         Mood and Affect: Mood normal.         Behavior: Behavior normal.         Thought Content: Thought content normal.         Judgment: Judgment normal.        Procedures     XR CHEST (2 VW)   Final Result   COPD with mild chronic lung changes.      No acute infiltrate or effusion.             MDM  Number of Diagnoses or Management Options  Dyspnea, unspecified type  Mild intermittent asthma with exacerbation  Diagnosis management comments: This is a 40 year old gentleman with a history of asthma that is poorly controlled, he reports that he runs out of his medicine and had seen primary care physician in quite some time.  Upon evaluation patient he was Aox4, nonhypoxic on room air, he medically stable, normotensive, no cyanosis, wheezing in bilateral lung fields, inspiratory and expiratory in nature.  No acute distress.  Patient's laboratory evaluation revealed normal troponin, normal electrolytes, normal kidney function, no leukocytosis.  Chest x-ray showed chronic COPD/asthma no concern for pneumonia, otherwise no acute cardiopulmonary process.  EKG showed normal sinus rhythm with no ST segment elevations or depressions.  Patient received DuoNeb treatment and Decadron IV.  He reported feeling much better after this.  On reexam patient's lung fields were greatly improved and wheezing had greatly diminished and his movement air with greatly improved.  Patient medically clear for discharge home.  He was sent outpatient medication of prednisone taper, Advair, and albuterol inhaler and Zyrtec.  He also be sent home with with supplies.  He was also given primary care follow-up.  All of his questions concerns answered.  Return precautions ER given.  Patient normotensive and normal heart rate at time of discharge.           ED Course as of 04/03/21 1114   Mon Apr 03, 2021   6270 EKG:  This EKG is signed and interpreted by EP.    Rate:  96  Rhythm: Sinus  Interpretation: no acute changes, no st segment elevations/depressions, T wave abnormalities.   Comparison: was normal and stable as compared to patient's most recent  EKG  [JR]   0830 Reevaluated patient.  After he received his breathing treatments.  Lung sounds have improved, less tightness, less inspiratory/expiratory wheezing.  He reports he feels much better and breathing is much better.  Patient is still going get Decadron. [JR]      ED Course User Index  [JR] Ulysees Barns, DO        ED Course as of 04/03/21 1114   Mon Apr 03, 2021   0630 EKG:  This EKG is signed and interpreted by EP.    Rate: 96  Rhythm: Sinus  Interpretation: no acute changes, no st segment elevations/depressions, T wave abnormalities.   Comparison: was normal and stable as compared to patient's most recent EKG  [JR]   0830 Reevaluated patient.  After he received his breathing treatments.  Lung sounds have improved, less tightness, less inspiratory/expiratory wheezing.  He reports he feels much better and breathing is much better.  Patient is still going get Decadron. [JR]      ED Course User Index  [JR] Ulysees Barns, DO       --------------------------------------------- PAST HISTORY ---------------------------------------------  Past Medical History:  has a past medical history of Asthma.    Past Surgical History:  has no past surgical history on file.    Social History:  reports that he quit smoking about 3 years ago. His smoking use included cigarettes. He has never used smokeless tobacco. He reports that he does not drink alcohol and does not use drugs.    Family History: family history is not on file.     The patient???s home medications have been reviewed.    Allergies: Patient has no known allergies.    -------------------------------------------------- RESULTS -------------------------------------------------  Labs:  Results for orders placed or performed during the hospital encounter of 04/03/21   RAPID  INFLUENZA A/B ANTIGENS    Specimen: Nasopharyngeal   Result Value Ref Range    Influenza A by PCR Not Detected Not Detected    Influenza B by PCR Not Detected Not Detected   COVID-19, Rapid    Specimen: Nasopharyngeal Swab   Result Value Ref Range    SARS-CoV-2, NAAT Not Detected Not Detected   BMP   Result Value Ref Range    Sodium 140 132 - 146 mmol/L    Potassium 4.5 3.5 - 5.0 mmol/L    Chloride 106 98 - 107 mmol/L    CO2 23 22 - 29 mmol/L    Anion Gap 11 7 - 16 mmol/L    Glucose 104 (H) 74 - 99 mg/dL    BUN 15 6 - 20 mg/dL    Creatinine 1.1 0.7 - 1.2 mg/dL    Est, Glom Filt Rate >60 >=60 mL/min/1.73    Calcium 9.6 8.6 - 10.2 mg/dL   CBC with Auto Differential   Result Value Ref Range    WBC 7.1 4.5 - 11.5 E9/L    RBC 5.63 3.80 - 5.80 E12/L    Hemoglobin 15.3 12.5 - 16.5 g/dL    Hematocrit 16.0 10.9 - 54.0 %    MCV 82.8 80.0 - 99.9 fL    MCH 27.2 26.0 - 35.0 pg    MCHC 32.8 32.0 - 34.5 %    RDW 13.6 11.5 - 15.0 fL    Platelets 248 130 - 450 E9/L    MPV 10.1 7.0 - 12.0 fL    Neutrophils % 52.9 43.0 - 80.0 %    Immature Granulocytes % 0.4 0.0 - 5.0 %  Lymphocytes % 27.7 20.0 - 42.0 %    Monocytes % 7.4 2.0 - 12.0 %    Eosinophils % 10.3 (H) 0.0 - 6.0 %    Basophils % 1.3 0.0 - 2.0 %    Neutrophils Absolute 3.74 1.80 - 7.30 E9/L    Immature Granulocytes # 0.03 E9/L    Lymphocytes Absolute 1.96 1.50 - 4.00 E9/L    Monocytes Absolute 0.52 0.10 - 0.95 E9/L    Eosinophils Absolute 0.73 (H) 0.05 - 0.50 E9/L    Basophils Absolute 0.09 0.00 - 0.20 E9/L   Troponin   Result Value Ref Range    Troponin, High Sensitivity <6 0 - 11 ng/L   EKG 12 Lead   Result Value Ref Range    Ventricular Rate 96 BPM    Atrial Rate 96 BPM    P-R Interval 162 ms    QRS Duration 88 ms    Q-T Interval 334 ms    QTc Calculation (Bazett) 421 ms    P Axis 68 degrees    R Axis 41 degrees    T Axis 63 degrees       Radiology:  XR CHEST (2 VW)   Final Result   COPD with mild chronic lung changes.      No acute infiltrate or effusion.              ------------------------- NURSING NOTES AND VITALS REVIEWED ---------------------------  Date / Time Roomed:  04/03/2021  7:47 AM  ED Bed Assignment:  HALL EE/HEE    The nursing notes within the ED encounter and vital signs as below have been reviewed.   BP (!) 153/83    Pulse 80    Temp 98 ??F (36.7 ??C)    Resp 18    SpO2 96%   Oxygen Saturation Interpretation: Normal      ------------------------------------------ PROGRESS NOTES ------------------------------------------  11:14 AM EST  I have spoken with the patient and discussed today???s results, in addition to providing specific details for the plan of care and counseling regarding the diagnosis and prognosis.  Their questions are answered at this time and they are agreeable with the plan. I discussed at length with them reasons for immediate return here for re evaluation. They will followup with their primary care physician by calling their office tomorrow.      --------------------------------- ADDITIONAL PROVIDER NOTES ---------------------------------  At this time the patient is without objective evidence of an acute process requiring hospitalization or inpatient management.  They have remained hemodynamically stable throughout their entire ED visit and are stable for discharge with outpatient follow-up.     The plan has been discussed in detail and they are aware of the specific conditions for emergent return, as well as the importance of follow-up.      New Prescriptions    ALBUTEROL SULFATE HFA (VENTOLIN HFA) 108 (90 BASE) MCG/ACT INHALER    Inhale 2 puffs into the lungs 4 times daily as needed for Wheezing    CETIRIZINE (ZYRTEC) 10 MG TABLET    Take 1 tablet by mouth daily    FLUTICASONE-SALMETEROL (ADVAIR DISKUS) 100-50 MCG/ACT AEPB DISKUS INHALER    Inhale 1 puff into the lungs in the morning and 1 puff in the evening.    PREDNISONE (DELTASONE) 20 MG TABLET    Take 1 tablet by mouth 2 times daily for 5 days       Diagnosis:  1. Mild intermittent  asthma with exacerbation    2. Dyspnea,  unspecified type        Disposition:  Patient's disposition: Discharge to home  Patient's condition is stable.     Attending was present and available throughout encounter including all critical portions; See Attending Note/Attestation for Final Plan      Ulysees Barns, DO  Resident  04/03/21 1114
